# Patient Record
Sex: Male | Born: 1975 | Race: White | Hispanic: No | Marital: Married | Smoking: Never smoker
Health system: Southern US, Community
[De-identification: ages and names within clinical notes are randomized; demographics above are authoritative.]

## PROBLEM LIST (undated history)

## (undated) DIAGNOSIS — M7071 Other bursitis of hip, right hip: Secondary | ICD-10-CM

## (undated) DIAGNOSIS — R079 Chest pain, unspecified: Secondary | ICD-10-CM

## (undated) DIAGNOSIS — R911 Solitary pulmonary nodule: Secondary | ICD-10-CM

## (undated) DIAGNOSIS — E785 Hyperlipidemia, unspecified: Secondary | ICD-10-CM

## (undated) DIAGNOSIS — I200 Unstable angina: Secondary | ICD-10-CM

## (undated) DIAGNOSIS — M1990 Unspecified osteoarthritis, unspecified site: Secondary | ICD-10-CM

## (undated) DIAGNOSIS — R569 Unspecified convulsions: Secondary | ICD-10-CM

## (undated) DIAGNOSIS — R002 Palpitations: Secondary | ICD-10-CM

## (undated) DIAGNOSIS — E039 Hypothyroidism, unspecified: Secondary | ICD-10-CM

## (undated) DIAGNOSIS — I2510 Atherosclerotic heart disease of native coronary artery without angina pectoris: Secondary | ICD-10-CM

## (undated) DIAGNOSIS — E109 Type 1 diabetes mellitus without complications: Secondary | ICD-10-CM

## (undated) HISTORY — PX: KNEE ARTHROSCOPY: SHX127

## (undated) HISTORY — DX: Unstable angina: I20.0

---

## 2015-08-15 HISTORY — PX: OTHER SURGICAL HISTORY: SHX169

## 2017-09-24 DIAGNOSIS — R079 Chest pain, unspecified: Secondary | ICD-10-CM | POA: Diagnosis not present

## 2017-09-24 DIAGNOSIS — E109 Type 1 diabetes mellitus without complications: Secondary | ICD-10-CM | POA: Diagnosis not present

## 2017-09-25 ENCOUNTER — Inpatient Hospital Stay (HOSPITAL_COMMUNITY)
Admission: AD | Admit: 2017-09-25 | Discharge: 2017-09-27 | DRG: 287 | Disposition: A | Discharge status: Home / Self Care | Payer: BLUE CROSS/BLUE SHIELD | Source: Other Acute Inpatient Hospital | Attending: Internal Medicine | Admitting: Internal Medicine

## 2017-09-25 ENCOUNTER — Other Ambulatory Visit: Payer: Self-pay

## 2017-09-25 ENCOUNTER — Encounter (HOSPITAL_COMMUNITY): Payer: Self-pay | Admitting: Nurse Practitioner

## 2017-09-25 DIAGNOSIS — E10649 Type 1 diabetes mellitus with hypoglycemia without coma: Secondary | ICD-10-CM | POA: Diagnosis not present

## 2017-09-25 DIAGNOSIS — Z79890 Hormone replacement therapy (postmenopausal): Secondary | ICD-10-CM

## 2017-09-25 DIAGNOSIS — E119 Type 2 diabetes mellitus without complications: Secondary | ICD-10-CM

## 2017-09-25 DIAGNOSIS — F17220 Nicotine dependence, chewing tobacco, uncomplicated: Secondary | ICD-10-CM | POA: Diagnosis present

## 2017-09-25 DIAGNOSIS — Z8249 Family history of ischemic heart disease and other diseases of the circulatory system: Secondary | ICD-10-CM | POA: Diagnosis not present

## 2017-09-25 DIAGNOSIS — R0789 Other chest pain: Secondary | ICD-10-CM | POA: Diagnosis present

## 2017-09-25 DIAGNOSIS — E1059 Type 1 diabetes mellitus with other circulatory complications: Secondary | ICD-10-CM

## 2017-09-25 DIAGNOSIS — Z833 Family history of diabetes mellitus: Secondary | ICD-10-CM | POA: Diagnosis not present

## 2017-09-25 DIAGNOSIS — E785 Hyperlipidemia, unspecified: Secondary | ICD-10-CM | POA: Diagnosis present

## 2017-09-25 DIAGNOSIS — E7800 Pure hypercholesterolemia, unspecified: Secondary | ICD-10-CM

## 2017-09-25 DIAGNOSIS — I2582 Chronic total occlusion of coronary artery: Secondary | ICD-10-CM | POA: Diagnosis present

## 2017-09-25 DIAGNOSIS — R079 Chest pain, unspecified: Secondary | ICD-10-CM

## 2017-09-25 DIAGNOSIS — R911 Solitary pulmonary nodule: Secondary | ICD-10-CM | POA: Diagnosis present

## 2017-09-25 DIAGNOSIS — Z794 Long term (current) use of insulin: Secondary | ICD-10-CM

## 2017-09-25 DIAGNOSIS — I200 Unstable angina: Secondary | ICD-10-CM

## 2017-09-25 DIAGNOSIS — E109 Type 1 diabetes mellitus without complications: Secondary | ICD-10-CM | POA: Diagnosis not present

## 2017-09-25 DIAGNOSIS — E039 Hypothyroidism, unspecified: Secondary | ICD-10-CM | POA: Diagnosis present

## 2017-09-25 DIAGNOSIS — E1169 Type 2 diabetes mellitus with other specified complication: Secondary | ICD-10-CM | POA: Diagnosis present

## 2017-09-25 DIAGNOSIS — I25110 Atherosclerotic heart disease of native coronary artery with unstable angina pectoris: Principal | ICD-10-CM | POA: Diagnosis present

## 2017-09-25 DIAGNOSIS — Z01810 Encounter for preprocedural cardiovascular examination: Secondary | ICD-10-CM | POA: Diagnosis not present

## 2017-09-25 DIAGNOSIS — I2510 Atherosclerotic heart disease of native coronary artery without angina pectoris: Secondary | ICD-10-CM

## 2017-09-25 HISTORY — DX: Chest pain, unspecified: R07.9

## 2017-09-25 HISTORY — DX: Other chest pain: R07.89

## 2017-09-25 HISTORY — DX: Palpitations: R00.2

## 2017-09-25 HISTORY — DX: Hypothyroidism, unspecified: E03.9

## 2017-09-25 HISTORY — DX: Solitary pulmonary nodule: R91.1

## 2017-09-25 HISTORY — DX: Hyperlipidemia, unspecified: E78.5

## 2017-09-25 HISTORY — DX: Type 1 diabetes mellitus without complications: E10.9

## 2017-09-25 HISTORY — DX: Unspecified convulsions: R56.9

## 2017-09-25 HISTORY — DX: Other bursitis of hip, right hip: M70.71

## 2017-09-25 HISTORY — DX: Type 2 diabetes mellitus without complications: E11.9

## 2017-09-25 HISTORY — DX: Unspecified osteoarthritis, unspecified site: M19.90

## 2017-09-25 LAB — GLUCOSE, CAPILLARY: Glucose-Capillary: 287 mg/dL — ABNORMAL HIGH (ref 65–99)

## 2017-09-25 MED ORDER — HEPARIN SODIUM (PORCINE) 5000 UNIT/ML IJ SOLN
5000.0000 [IU] | Freq: Three times a day (TID) | INTRAMUSCULAR | Status: DC
  Administered 2017-09-25: 5000 [IU] via SUBCUTANEOUS
  Filled 2017-09-25: qty 1

## 2017-09-25 MED ORDER — ASPIRIN EC 81 MG PO TBEC
81.0000 mg | DELAYED_RELEASE_TABLET | Freq: Every day | ORAL | Status: DC
  Administered 2017-09-25 – 2017-09-27 (×2): 81 mg via ORAL
  Filled 2017-09-25 (×3): qty 1

## 2017-09-25 MED ORDER — LEVOTHYROXINE SODIUM 75 MCG PO TABS
175.0000 ug | ORAL_TABLET | Freq: Every day | ORAL | Status: DC
  Administered 2017-09-26 – 2017-09-27 (×2): 175 ug via ORAL
  Filled 2017-09-25 (×2): qty 1

## 2017-09-25 MED ORDER — INSULIN GLARGINE 100 UNIT/ML ~~LOC~~ SOLN
20.0000 [IU] | Freq: Every day | SUBCUTANEOUS | Status: DC
  Administered 2017-09-26 – 2017-09-27 (×2): 20 [IU] via SUBCUTANEOUS
  Filled 2017-09-25 (×2): qty 0.2

## 2017-09-25 MED ORDER — ONDANSETRON HCL 4 MG PO TABS: 4.0000 mg | ORAL_TABLET | Freq: Four times a day (QID) | ORAL | Status: DC | PRN

## 2017-09-25 MED ORDER — SENNOSIDES-DOCUSATE SODIUM 8.6-50 MG PO TABS: 1.0000 | ORAL_TABLET | Freq: Every evening | ORAL | Status: DC | PRN

## 2017-09-25 MED ORDER — ATORVASTATIN CALCIUM 40 MG PO TABS
40.0000 mg | ORAL_TABLET | Freq: Every day | ORAL | Status: DC
  Administered 2017-09-25 – 2017-09-26 (×2): 40 mg via ORAL
  Filled 2017-09-25 (×2): qty 1

## 2017-09-25 MED ORDER — ACETAMINOPHEN 325 MG PO TABS
650.0000 mg | ORAL_TABLET | Freq: Four times a day (QID) | ORAL | Status: DC | PRN
  Administered 2017-09-27: 650 mg via ORAL
  Filled 2017-09-25: qty 2

## 2017-09-25 MED ORDER — ACETAMINOPHEN 650 MG RE SUPP: 650.0000 mg | Freq: Four times a day (QID) | RECTAL | Status: DC | PRN

## 2017-09-25 MED ORDER — INSULIN ASPART 100 UNIT/ML ~~LOC~~ SOLN: 0.0000 [IU] | Freq: Three times a day (TID) | SUBCUTANEOUS | Status: DC

## 2017-09-25 MED ORDER — INSULIN DEGLUDEC 100 UNIT/ML ~~LOC~~ SOPN: 40.0000 [IU] | PEN_INJECTOR | Freq: Every day | SUBCUTANEOUS | Status: DC

## 2017-09-25 MED ORDER — INSULIN GLARGINE 100 UNIT/ML ~~LOC~~ SOLN: 40.0000 [IU] | Freq: Every day | SUBCUTANEOUS | Status: DC

## 2017-09-25 MED ORDER — ONDANSETRON HCL 4 MG/2ML IJ SOLN
4.0000 mg | Freq: Four times a day (QID) | INTRAMUSCULAR | Status: DC | PRN
Start: 2017-09-25 — End: 2017-09-27

## 2017-09-25 MED ORDER — HYDROCODONE-ACETAMINOPHEN 5-325 MG PO TABS: 1.0000 | ORAL_TABLET | ORAL | Status: DC | PRN

## 2017-09-25 NOTE — H&P (Signed)
History and Physical    Chiron Louis Byrd:[id number] DOB: 10/13/1975 DOA: 09/25/2017  PCP: No primary care provider on file. Patient coming from: [facility]  Chief Complaint: Chest pain  HPI: Louis Byrd is a 42 y.o. male with medical history significant type 1 diabetes hyperlipidemia admitted to [facility] 5 days ago complaints of chest pain. He was admitted for chest pain rule out  Chart review indicates patient with multiple risk factors including diabetes hyperlipidemia and family history. He underwent a stress test that was abnormal. Case was discussed with cardiology at [facility] who recommended hospital hospital transfer for cardiac cath. In addition high dose aspirin and statin added increased dose as well as a beta blocker were recommended. He states the pain was left anterior describes it as a pressure. Pain has been "at a minimum" for the last 2 days. The time of admission he is pain-free. He's been evaluated by [facility] who opined most likely a blockage and had scheduled cardiac catheter tomorrow time uncertain at this point.   Patient denies headache dizziness syncope or near-syncope. He denies abdominal pain nausea vomiting diarrhea constipation melena bright red blood per rectum. He denies dysuria hematuria frequency or urgency.    ED Course: On arrival he is afebrile a hemodynamically stable and not hypoxic.  Review of Systems: As per HPI otherwise all other systems reviewed and are negative.   Ambulatory Status: Ambulates independently is independent with ADLs  Past Medical History:  Diagnosis Date  . Chest pain    a. 1/2/019 Ex MV [facility]): Ex time 8:00 No ecg changes. Large severe apical rev defect and small mod sev apicoseptal rev defect. EF 42%.  . Hyperlipidemia   . Palpitations    a. premature atrial contractions.  . Pulmonary nodule    a. 08/2017 CXR - [facility]: "pulm nodule posterior lung base on lateral film, rec noncontrast chest CT for  further eval.  . Type I diabetes mellitus (HCC)    a. Dx age 70;  b. Most recent A1c 6.3.      Social History   Socioeconomic History  . Marital status: Single    Spouse name: Not on file  . Number of children: Not on file  . Years of education: Not on file  . Highest education level: Not on file  Social Needs  . Financial resource strain: Not on file  . Food insecurity - worry: Not on file  . Food insecurity - inability: Not on file  . Transportation needs - medical: Not on file  . Transportation needs - non-medical: Not on file  Occupational History  . Occupation: Armed forces training and education officer    Comment: does some heavy exertion.  Tobacco Use  . Smoking status: Never Smoker  . Smokeless tobacco: Current User    Types: Chew, Snuff  Substance and Sexual Activity  . Alcohol use: Yes    Frequency: Never    Comment: Occasional drink on the weekend.  Sometimes heavy.  . Drug use: No  . Sexual activity: Not on file  Other Topics Concern  . Not on file  Social History Narrative   Lives in [place] with his wife and their children.    Allergies not on file  Family History  Problem Relation Age of Onset  . Heart attack Father   . Diabetes Mellitus I Brother     Prior to Admission medications   Medication Sig Start Date End Date Taking? Authorizing Provider  levothyroxine (SYNTHROID, LEVOTHROID) 175 MCG tablet Take  175 mcg by mouth daily. 09/07/17   [provider]  lisinopril (PRINIVIL,ZESTRIL) 10 MG tablet Take 10 mg by mouth daily. 08/24/17   [provider]  naproxen (NAPROSYN) 500 MG tablet Take 500 mg by mouth 2 (two) times daily as needed for pain. 09/24/17   [provider]  TRESIBA FLEXTOUCH 100 UNIT/ML SOPN FlexTouch Pen Inject 40 Units into the skin daily. 08/30/17   [provider]    Physical Exam: Vitals:   09/25/17 1721  BP: 136/89  Pulse: 67  Resp: 18  Temp: 98 F (36.7 C)  TempSrc: Oral  SpO2: 98%  Weight: 84.8 kg (187  lb)  Height: 5\' 9"  (1.753 m)     General:  Appears calm and comfortable sitting on the side of the bed in no acute distress Eyes:  PERRL, EOMI, normal lids, iris no scleral icterus ENT:  grossly normal hearing, lips & tongue,  Neck:  no LAD, masses or thyromegaly Cardiovascular:  RRR, no m/r/g. No LE edema.  Respiratory:  CTA bilaterally, no w/r/r. Normal respiratory effort. Abdomen:  soft, ntnd, hospital bowel sounds throughout no guarding or rebounding Skin:  no rash or induration seen on limited exam Musculoskeletal:  grossly normal tone BUE/BLE, good ROM, no bony abnormality Psychiatric:  grossly normal mood and affect, speech fluent and appropriate, AOx3 Neurologic:  CN 2-12 grossly intact, moves all extremities in coordinated fashion, sensation intact speech clear facial symmetry  Labs on Admission: I have personally reviewed following labs and imaging studies  CBC: No results for input(s): WBC, NEUTROABS, HGB, HCT, MCV, PLT in the last 168 hours. Basic Metabolic Panel: No results for input(s): NA, K, CL, CO2, GLUCOSE, BUN, CREATININE, CALCIUM, MG, PHOS in the last 168 hours. GFR: CrCl cannot be calculated (No order found.). Liver Function Tests: No results for input(s): AST, ALT, ALKPHOS, BILITOT, PROT, ALBUMIN in the last 168 hours. No results for input(s): LIPASE, AMYLASE in the last 168 hours. No results for input(s): AMMONIA in the last 168 hours. Coagulation Profile: No results for input(s): INR, PROTIME in the last 168 hours. Cardiac Enzymes: No results for input(s): CKTOTAL, CKMB, CKMBINDEX, TROPONINI in the last 168 hours. BNP (last 3 results) No results for input(s): PROBNP in the last 8760 hours. HbA1C: No results for input(s): HGBA1C in the last 72 hours. CBG: No results for input(s): GLUCAP in the last 168 hours. Lipid Profile: No results for input(s): CHOL, HDL, LDLCALC, TRIG, CHOLHDL, LDLDIRECT in the last 72 hours. Thyroid Function Tests: No results for  input(s): TSH, T4TOTAL, FREET4, T3FREE, THYROIDAB in the last 72 hours. Anemia Panel: No results for input(s): VITAMINB12, FOLATE, FERRITIN, TIBC, IRON, RETICCTPCT in the last 72 hours. Urine analysis: No results found for: COLORURINE, APPEARANCEUR, LABSPEC, PHURINE, GLUCOSEU, HGBUR, BILIRUBINUR, KETONESUR, PROTEINUR, UROBILINOGEN, NITRITE, LEUKOCYTESUR  Creatinine Clearance: CrCl cannot be calculated (No order found.).  Sepsis Labs: @LABRCNTIP (procalcitonin:4,lacticidven:4) )No results found for this or any previous visit (from the past 240 hour(s)).   Radiological Exams on Admission: No results found.  EKG:   Assessment/Plan Principal Problem:   Chest pain Active Problems:   Solitary pulmonary nodule   Diabetes (HCC)   Hyperlipidemia   Unstable angina (HCC)   Hypothyroidism   #1. Chest pain. [facility] transferred to [place] [facility] for cardiac cath tomorrow. Evaluated by cardiology. -Admit to telemetry -Nothing by mouth past midnight -Aspirin and statin per recommendations -4 further recommendations to cardiology   #2. Diabetes. Medications include long-acting insulin and NovoLog -We'll continue long-acting  at a lower dose -Sliding scale per optimal control -Hold lisinopril for now -Carb modified diet  #3. Hypothyroidism. Home medications include Synthroid -Continue home meds  #4. Pulmonary nodule. Incidentally noted on chest x-ray -We'll need outpatient follow-up     DVT prophylaxis: heparin  Code Status: full  Family Communication: wife at bedside  Disposition Plan: home when ready  Consults called: dr c with cards  Admission status: inpatient    Gwenyth Bender MD [facility]  If 7PM-7AM, please contact night-coverage [email or web address] Password [facility]  09/25/2017, 6:25 PM

## 2017-09-25 NOTE — H&P (View-Only) (Signed)
Cardiology Consult    Patient ID: Louis Byrd MRN: [id number], DOB/AGE: March 18, 1976   Admit date: 09/25/2017 Date of Consult: 09/25/2017  Primary Physician: No primary care provider on file. Primary Cardiologist: Will f/u in [place] Requesting Provider: Micheal Likens, MD  Patient Profile    Louis Byrd is a 42 y.o. male with a history of type I DM, palpitations, and HL, who is being seen today for the evaluation of unstable angina and abnl stress test at the request of Dr. Randel Pigg.  Past Medical History   Past Medical History:  Diagnosis Date  . Chest pain    a. 1/2/019 Ex MV [facility]): Ex time 8:00 No ecg changes. Large severe apical rev defect and small mod sev apicoseptal rev defect. EF 42%.  . Hyperlipidemia   . Palpitations    a. premature atrial contractions.  . Pulmonary nodule    a. 08/2017 CXR - [facility]: "pulm nodule posterior lung base on lateral film, rec noncontrast chest CT for further eval.  . Type I diabetes mellitus (HCC)    a. Dx age 63;  b. Most recent A1c 6.3.    Allergies  Allergies not on file  History of Present Illness    42 year old male without prior cardiac history.  He does have a history of type 1 diabetes, diagnosed at age 65.  He has been on ACE inhibitor and statin therapy over the years.  He also has a history of occasional brief palpitations.  He lives in [place] with his wife and their children.  He plays heavy metal guitar for fun.  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  He was in his usual state of health until about 5-6 days ago, when he began to experience flushing with exertion, noting that his face would turn red.  Within 2 days or so, he began to experience exertional chest tightness and dyspnea occurring with minimal activity.  Symptoms persisted over the subsequent 3 days, prompting him to present to [facility] on February 11.  There, ECG was nonacute and  troponins were normal.  He continued to note mild chest discomfort, even at rest.  He underwent stress testing this morning was able to walk on the treadmill for 8 minutes.  ECG was unremarkable however, imaging showed apical and apicalseptal reversible ischemia.  EF was measured at 42%.  As result, he was transferred to [facility] for further evaluation.  He currently reports a very mild retrosternal chest tightness.  Inpatient Medications    . aspirin EC  81 mg Oral Daily  . heparin  5,000 Units Subcutaneous Q8H  . [START ON 09/26/2017] insulin glargine  40 Units Subcutaneous Daily  . [START ON 09/26/2017] levothyroxine  175 mcg Oral QAC breakfast    Family History    Family History  Problem Relation Age of Onset  . Heart attack Father   . Diabetes Mellitus I Brother    indicated that his mother is alive. He indicated that his father is deceased. He indicated that his brother is alive.   Social History    Social History   Socioeconomic History  . Marital status: Single    Spouse name: Not on file  . Number of children: Not on file  . Years of education: Not on file  . Highest education level: Not on file  Social Needs  . Financial resource strain: Not on file  . Food insecurity -  worry: Not on file  . Food insecurity - inability: Not on file  . Transportation needs - medical: Not on file  . Transportation needs - non-medical: Not on file  Occupational History  . 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  Tobacco Use  . Smoking status: Never Smoker  . Smokeless tobacco: Current User    Types: Chew, Snuff  Substance and Sexual Activity  . Alcohol use: Yes    Frequency: Never    Comment: Occasional drink on the weekend.  Sometimes heavy.  . Drug use: No  . Sexual activity: Not on file  Other Topics Concern  . Not on file  Social History Narrative   Lives in [place] with his wife and their children.     Review of Systems    General:  No  chills, fever, night sweats or weight changes.  Cardiovascular:  +++ exertional chest pain, +++ dyspnea on exertion, no edema, orthopnea, palpitations, paroxysmal nocturnal dyspnea. Dermatological: No rash, lesions/masses Respiratory: No cough, +++ dyspnea Urologic: No hematuria, dysuria Abdominal:   No nausea, vomiting, diarrhea, bright red blood per rectum, melena, or hematemesis Neurologic:  No visual changes, wkns, changes in mental status. All other systems reviewed and are otherwise negative except as noted above.  Physical Exam    Blood pressure 136/89, pulse 67, temperature 98 F (36.7 C), temperature source Oral, resp. rate 18, height 5\' 9"  (1.753 m), weight 187 lb (84.8 kg), SpO2 98 %.  General: Pleasant, NAD Psych: Normal affect. Neuro: Alert and oriented X 3. Moves all extremities spontaneously. HEENT: Normal  Neck: Supple without bruits or JVD. Lungs:  Resp regular and unlabored, CTA. Heart: RRR no s3, s4, or murmurs. Abdomen: Soft, non-tender, non-distended, BS + x 4.  Extremities: No clubbing, cyanosis or edema. DP/PT/Radials 2+ and equal bilaterally.  Labs    Hemoglobin 16.3, hematocrit 49.3, WBC 7.1, platelets 252 INR 1.1 Sodium 137, potassium 4.3, chloride 100, CO2 30, BUN 11, creatinine 0.70, glucose 119 Total bilirubin 0.9, alkaline phosphatase 62, AST 30, ALT 26, total protein 7.2, albumin 4.6 Troponin I 0.02, 0.02, less than 0.01 BNP 56 Total cholesterol 194, try glycerides 114, HDL 81, LDL 90.2 Urinalysis negative Urine drug screen negative  Radiology Studies    No results found.  ECG & Cardiac Imaging    Regular sinus rhythm, 82, no acute ST or T changes. (This was a 12-lead from his stress test)  Assessment & Plan    1.  Unstable angina: Patient presents on transfer from [facility] with a 5-6-day history of progressive exertional substernal chest heaviness associated with dyspnea in the setting of a 31-year history of type 1 diabetes.  He  ruled out for MI at [facility] however, stress testing was abnormal with suggestion of reversible ischemia in the apical and apical septal regions.  He currently reports very mild chest tightness.  We will plan on diagnostic catheterization in the morning.  The patient understands that risks include but are not limited to stroke (1 in 1000), death (1 in 1000), kidney failure [usually temporary] (1 in 500), bleeding (1 in 200), allergic reaction [possibly serious] (1 in 200), and agrees to proceed.  Continue aspirin, statin.  We will hold his lisinopril pending catheterization tomorrow.  2.  Hyperlipidemia: LDL was 90 with normal LFTs by evaluation at [facility].  Continue statin therapy.  3.  Type 1 diabetes mellitus: Internal medicine will be admitting.  Last A1c was 6.3.  4.  Pulmonary nodule: This was incidentally noted on chest x-ray at [facility] with recommendation for future follow-up with noncontrast CT.  5.  Tobacco abuse: Patient chews.  Cessation advised.  Signed, Nicolasa Ducking, NP 09/25/2017, 6:14 PM  For questions or updates, please contact   Please consult [email or web address] for contact info under Cardiology/STEMI.  I have seen and examined the patient along with  Ward Givens, NP.  I have reviewed the chart, notes and new data.  I agree with NP's note.  Key new complaints: classic symptoms of recent onset exertional angina with accelerated pattern Key examination changes: normal CV exam Key new findings / data: nuclear perfusion study c/w mid-LAD high grade stenosis, suspect low LVEF (42%) may just be due to ischemia/stunning, not permanent myocardial injury. No signs or symptoms of acute coronary insufficiency right now.  PLAN: Coronary angio and possible PCI-stent in AM. This procedure has been fully reviewed with the patient and written informed consent has been obtained.   Thurmon Fair, MD, [facility] [phone] 09/25/2017, 6:25 PM

## 2017-09-25 NOTE — Consult Note (Signed)
 Cardiology Consult    Patient ID: Louis Byrd MRN: [id number], DOB/AGE: 41/05/1976   Admit date: 09/25/2017 Date of Consult: 09/25/2017  Primary Physician: No primary care provider on file. Primary Cardiologist: Will f/u in [place] Requesting Provider: E. Silva Zapata, MD  Patient Profile    Louis Byrd is a 42 y.o. male with a history of type I DM, palpitations, and HL, who is being seen today for the evaluation of unstable angina and abnl stress test at the request of Dr. Silva Zapata.  Past Medical History   Past Medical History:  Diagnosis Date  . Chest pain    a. 1/2/019 Ex MV ([facility]): Ex time 8:00 No ecg changes. Large severe apical rev defect and small mod sev apicoseptal rev defect. EF 42%.  . Hyperlipidemia   . Palpitations    a. premature atrial contractions.  . Pulmonary nodule    a. 08/2017 CXR - [facility]: "pulm nodule posterior lung base on lateral film, rec noncontrast chest CT for further eval.  . Type I diabetes mellitus (HCC)    a. Dx age 42;  b. Most recent A1c 6.3.    Allergies  Allergies not on file  History of Present Illness    42-year-old male without prior cardiac history.  He does have a history of type 1 diabetes, diagnosed at age 42.  He has been on ACE inhibitor and statin therapy over the years.  He also has a history of occasional brief palpitations.  He lives in [place] with his wife and their children.  He plays heavy metal guitar for fun.  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  He was in his usual state of health until about 5-6 days ago, when he began to experience flushing with exertion, noting that his face would turn red.  Within 2 days or so, he began to experience exertional chest tightness and dyspnea occurring with minimal activity.  Symptoms persisted over the subsequent 3 days, prompting him to present to [facility] on February 11.  There, ECG was nonacute and  troponins were normal.  He continued to note mild chest discomfort, even at rest.  He underwent stress testing this morning was able to walk on the treadmill for 8 minutes.  ECG was unremarkable however, imaging showed apical and apicalseptal reversible ischemia.  EF was measured at 42%.  As result, he was transferred to [place] for further evaluation.  He currently reports a very mild retrosternal chest tightness.  Inpatient Medications    . aspirin EC  81 mg Oral Daily  . heparin  5,000 Units Subcutaneous Q8H  . [START ON 09/26/2017] insulin glargine  40 Units Subcutaneous Daily  . [START ON 09/26/2017] levothyroxine  175 mcg Oral QAC breakfast    Family History    Family History  Problem Relation Age of Onset  . Heart attack Father   . Diabetes Mellitus I Brother    indicated that his mother is alive. He indicated that his father is deceased. He indicated that his brother is alive.   Social History    Social History   Socioeconomic History  . Marital status: Single    Spouse name: Not on file  . Number of children: Not on file  . Years of education: Not on file  . Highest education level: Not on file  Social Needs  . Financial resource strain: Not on file  . Food insecurity -   worry: Not on file  . Food insecurity - inability: Not on file  . Transportation needs - medical: Not on file  . Transportation needs - non-medical: Not on file  Occupational History  . Occupation: Process technician    Comment: does some heavy exertion.  Tobacco Use  . Smoking status: Never Smoker  . Smokeless tobacco: Current User    Types: Chew, Snuff  Substance and Sexual Activity  . Alcohol use: Yes    Frequency: Never    Comment: Occasional drink on the weekend.  Sometimes heavy.  . Drug use: No  . Sexual activity: Not on file  Other Topics Concern  . Not on file  Social History Narrative   Lives in [place] with his wife and their children.     Review of Systems    General:  No  chills, fever, night sweats or weight changes.  Cardiovascular:  +++ exertional chest pain, +++ dyspnea on exertion, no edema, orthopnea, palpitations, paroxysmal nocturnal dyspnea. Dermatological: No rash, lesions/masses Respiratory: No cough, +++ dyspnea Urologic: No hematuria, dysuria Abdominal:   No nausea, vomiting, diarrhea, bright red blood per rectum, melena, or hematemesis Neurologic:  No visual changes, wkns, changes in mental status. All other systems reviewed and are otherwise negative except as noted above.  Physical Exam    Blood pressure 136/89, pulse 67, temperature 98 F (36.7 C), temperature source Oral, resp. rate 18, height 5' 9" (1.753 m), weight 187 lb (84.8 kg), SpO2 98 %.  General: Pleasant, NAD Psych: Normal affect. Neuro: Alert and oriented X 3. Moves all extremities spontaneously. HEENT: Normal  Neck: Supple without bruits or JVD. Lungs:  Resp regular and unlabored, CTA. Heart: RRR no s3, s4, or murmurs. Abdomen: Soft, non-tender, non-distended, BS + x 4.  Extremities: No clubbing, cyanosis or edema. DP/PT/Radials 2+ and equal bilaterally.  Labs    Hemoglobin 16.3, hematocrit 49.3, WBC 7.1, platelets 252 INR 1.1 Sodium 137, potassium 4.3, chloride 100, CO2 30, BUN 11, creatinine 0.70, glucose 119 Total bilirubin 0.9, alkaline phosphatase 62, AST 30, ALT 26, total protein 7.2, albumin 4.6 Troponin I 0.02, 0.02, less than 0.01 BNP 56 Total cholesterol 194, try glycerides 114, HDL 81, LDL 90.2 Urinalysis negative Urine drug screen negative  Radiology Studies    No results found.  ECG & Cardiac Imaging    Regular sinus rhythm, 82, no acute ST or T changes. (This was a 12-lead from his stress test)  Assessment & Plan    1.  Unstable angina: Patient presents on transfer from [facility] with a 5-6-day history of progressive exertional substernal chest heaviness associated with dyspnea in the setting of a 31-year history of type 1 diabetes.  He  ruled out for MI at [facility] however, stress testing was abnormal with suggestion of reversible ischemia in the apical and apical septal regions.  He currently reports very mild chest tightness.  We will plan on diagnostic catheterization in the morning.  The patient understands that risks include but are not limited to stroke (1 in 1000), death (1 in 1000), kidney failure [usually temporary] (1 in 500), bleeding (1 in 200), allergic reaction [possibly serious] (1 in 200), and agrees to proceed.  Continue aspirin, statin.  We will hold his lisinopril pending catheterization tomorrow.  2.  Hyperlipidemia: LDL was 90 with normal LFTs by evaluation at [facility].  Continue statin therapy.  3.  Type 1 diabetes mellitus: Internal medicine will be admitting.  Last A1c was 6.3.  4.    Pulmonary nodule: This was incidentally noted on chest x-ray at [facility] with recommendation for future follow-up with noncontrast CT.  5.  Tobacco abuse: Patient chews.  Cessation advised.  Signed, Christopher Berge, NP 09/25/2017, 6:14 PM  For questions or updates, please contact   Please consult [email or web address] for contact info under Cardiology/STEMI.  I have seen and examined the patient along with  Chris Berge, NP.  I have reviewed the chart, notes and new data.  I agree with NP's note.  Key new complaints: classic symptoms of recent onset exertional angina with accelerated pattern Key examination changes: normal CV exam Key new findings / data: nuclear perfusion study c/w mid-LAD high grade stenosis, suspect low LVEF (42%) may just be due to ischemia/stunning, not permanent myocardial injury. No signs or symptoms of acute coronary insufficiency right now.  PLAN: Coronary angio and possible PCI-stent in AM. This procedure has been fully reviewed with the patient and written informed consent has been obtained.   Karris Deangelo, MD, FACC [facility] [phone] 09/25/2017, 6:25 PM  

## 2017-09-26 ENCOUNTER — Inpatient Hospital Stay (HOSPITAL_COMMUNITY)
Admission: AD | Disposition: A | Discharge status: Home / Self Care | Payer: Self-pay | Source: Other Acute Inpatient Hospital | Attending: Internal Medicine

## 2017-09-26 ENCOUNTER — Encounter (HOSPITAL_COMMUNITY): Payer: Self-pay | Admitting: Cardiovascular Disease

## 2017-09-26 ENCOUNTER — Other Ambulatory Visit: Payer: Self-pay | Admitting: *Deleted

## 2017-09-26 ENCOUNTER — Inpatient Hospital Stay (HOSPITAL_COMMUNITY): Payer: BLUE CROSS/BLUE SHIELD

## 2017-09-26 DIAGNOSIS — I2510 Atherosclerotic heart disease of native coronary artery without angina pectoris: Secondary | ICD-10-CM

## 2017-09-26 DIAGNOSIS — I25110 Atherosclerotic heart disease of native coronary artery with unstable angina pectoris: Secondary | ICD-10-CM

## 2017-09-26 DIAGNOSIS — I200 Unstable angina: Secondary | ICD-10-CM

## 2017-09-26 HISTORY — PX: LEFT HEART CATH AND CORONARY ANGIOGRAPHY: CATH118249

## 2017-09-26 LAB — HIV ANTIBODY (ROUTINE TESTING W REFLEX): HIV Screen 4th Generation wRfx: NONREACTIVE

## 2017-09-26 LAB — GLUCOSE, CAPILLARY
GLUCOSE-CAPILLARY: 69 mg/dL (ref 65–99)
GLUCOSE-CAPILLARY: 100 mg/dL — AB (ref 65–99)
GLUCOSE-CAPILLARY: 186 mg/dL — AB (ref 65–99)
GLUCOSE-CAPILLARY: 212 mg/dL — AB (ref 65–99)
Glucose-Capillary: 80 mg/dL (ref 65–99)
Glucose-Capillary: 322 mg/dL — ABNORMAL HIGH (ref 65–99)

## 2017-09-26 LAB — BASIC METABOLIC PANEL
ANION GAP: 12 (ref 5–15)
BUN: 10 mg/dL (ref 6–20)
CO2: 28 mmol/L (ref 22–32)
Calcium: 9.3 mg/dL (ref 8.9–10.3)
Chloride: 101 mmol/L (ref 101–111)
Creatinine, Ser: 0.8 mg/dL (ref 0.61–1.24)
GFR calc Af Amer: >60 mL/min (ref >60)
Glucose, Bld: 116 mg/dL — ABNORMAL HIGH (ref 65–99)
Potassium: 3.7 mmol/L (ref 3.5–5.1)
SODIUM: 141 mmol/L (ref 135–145)

## 2017-09-26 LAB — ECHOCARDIOGRAM COMPLETE
HEIGHTINCHES: 69 in
Weight: 2936 oz

## 2017-09-26 LAB — CBC
HCT: 46.1 % (ref 39.0–52.0)
Hemoglobin: 16.1 g/dL (ref 13.0–17.0)
MCH: 30.4 pg (ref 26.0–34.0)
MCHC: 34.9 g/dL (ref 30.0–36.0)
MCV: 87 fL (ref 78.0–100.0)
PLATELETS: 220 10*3/uL (ref 150–400)
RBC: 5.3 MIL/uL (ref 4.22–5.81)
RDW: 12.9 % (ref 11.5–15.5)
WBC: 7.8 10*3/uL (ref 4.0–10.5)

## 2017-09-26 SURGERY — LEFT HEART CATH AND CORONARY ANGIOGRAPHY
Anesthesia: Choice

## 2017-09-26 MED ORDER — ASPIRIN 81 MG PO CHEW
81.0000 mg | CHEWABLE_TABLET | ORAL | Status: AC
  Administered 2017-09-26: 81 mg via ORAL
  Filled 2017-09-26: qty 1

## 2017-09-26 MED ORDER — IOPAMIDOL (ISOVUE-370) INJECTION 76%
INTRAVENOUS | Status: AC
  Filled 2017-09-26: qty 100

## 2017-09-26 MED ORDER — HEPARIN SODIUM (PORCINE) 1000 UNIT/ML IJ SOLN
INTRAMUSCULAR | Status: AC
  Filled 2017-09-26: qty 1

## 2017-09-26 MED ORDER — SODIUM CHLORIDE 0.9 % IV SOLN: 250.0000 mL | INTRAVENOUS | Status: DC | PRN

## 2017-09-26 MED ORDER — SODIUM CHLORIDE 0.9% FLUSH: 3.0000 mL | INTRAVENOUS | Status: DC | PRN

## 2017-09-26 MED ORDER — FENTANYL CITRATE (PF) 100 MCG/2ML IJ SOLN
INTRAMUSCULAR | Status: AC
  Filled 2017-09-26: qty 2

## 2017-09-26 MED ORDER — LIDOCAINE HCL 1 % IJ SOLN
INTRAMUSCULAR | Status: AC
  Filled 2017-09-26: qty 20

## 2017-09-26 MED ORDER — IOPAMIDOL (ISOVUE-370) INJECTION 76%
INTRAVENOUS | Status: DC | PRN
  Administered 2017-09-26: 47 mL via INTRAVENOUS

## 2017-09-26 MED ORDER — HEPARIN (PORCINE) IN NACL 2-0.9 UNIT/ML-% IJ SOLN
INTRAMUSCULAR | Status: AC
  Filled 2017-09-26: qty 1000

## 2017-09-26 MED ORDER — INSULIN ASPART 100 UNIT/ML ~~LOC~~ SOLN
4.0000 [IU] | Freq: Three times a day (TID) | SUBCUTANEOUS | Status: DC
  Administered 2017-09-26: 4 [IU] via SUBCUTANEOUS

## 2017-09-26 MED ORDER — LIDOCAINE HCL (PF) 1 % IJ SOLN
INTRAMUSCULAR | Status: DC | PRN
  Administered 2017-09-26: 2 mL via SUBCUTANEOUS

## 2017-09-26 MED ORDER — HEPARIN (PORCINE) IN NACL 100-0.45 UNIT/ML-% IJ SOLN
1100.0000 [IU]/h | INTRAMUSCULAR | Status: DC
  Administered 2017-09-26 – 2017-09-27 (×2): 1100 [IU]/h via INTRAVENOUS
  Filled 2017-09-26 (×2): qty 250

## 2017-09-26 MED ORDER — HEPARIN (PORCINE) IN NACL 2-0.9 UNIT/ML-% IJ SOLN
INTRAMUSCULAR | Status: AC | PRN
  Administered 2017-09-26 (×2): 500 mL

## 2017-09-26 MED ORDER — SODIUM CHLORIDE 0.9 % WEIGHT BASED INFUSION: 3.0000 mL/kg/h | INTRAVENOUS | Status: DC

## 2017-09-26 MED ORDER — VERAPAMIL HCL 2.5 MG/ML IV SOLN
INTRAVENOUS | Status: DC | PRN
  Administered 2017-09-26: 10 mL via INTRA_ARTERIAL

## 2017-09-26 MED ORDER — SODIUM CHLORIDE 0.9% FLUSH: 3.0000 mL | Freq: Two times a day (BID) | INTRAVENOUS | Status: DC

## 2017-09-26 MED ORDER — HEPARIN SODIUM (PORCINE) 1000 UNIT/ML IJ SOLN
INTRAMUSCULAR | Status: DC | PRN
  Administered 2017-09-26: 5000 [IU] via INTRAVENOUS

## 2017-09-26 MED ORDER — MIDAZOLAM HCL 2 MG/2ML IJ SOLN
INTRAMUSCULAR | Status: AC
  Filled 2017-09-26: qty 2

## 2017-09-26 MED ORDER — SODIUM CHLORIDE 0.9 % WEIGHT BASED INFUSION
3.0000 mL/kg/h | INTRAVENOUS | Status: AC
  Administered 2017-09-26: 3 mL/kg/h via INTRAVENOUS

## 2017-09-26 MED ORDER — INSULIN ASPART 100 UNIT/ML ~~LOC~~ SOLN
0.0000 [IU] | Freq: Three times a day (TID) | SUBCUTANEOUS | Status: DC
  Administered 2017-09-26: 4 [IU] via SUBCUTANEOUS

## 2017-09-26 MED ORDER — INSULIN ASPART 100 UNIT/ML ~~LOC~~ SOLN
5.0000 [IU] | Freq: Once | SUBCUTANEOUS | Status: AC
  Administered 2017-09-26: 5 [IU] via SUBCUTANEOUS

## 2017-09-26 MED ORDER — SODIUM CHLORIDE 0.9 % WEIGHT BASED INFUSION: 1.0000 mL/kg/h | INTRAVENOUS | Status: DC

## 2017-09-26 MED ORDER — SODIUM CHLORIDE 0.9 % WEIGHT BASED INFUSION
1.0000 mL/kg/h | INTRAVENOUS | Status: DC
  Administered 2017-09-26: 1 mL/kg/h via INTRAVENOUS

## 2017-09-26 MED ORDER — HEPARIN (PORCINE) IN NACL 100-0.45 UNIT/ML-% IJ SOLN: 1100.0000 [IU]/h | INTRAMUSCULAR | Status: DC

## 2017-09-26 MED ORDER — ASPIRIN 81 MG PO CHEW: 81.0000 mg | CHEWABLE_TABLET | ORAL | Status: DC

## 2017-09-26 MED ORDER — SODIUM CHLORIDE 0.9% FLUSH
3.0000 mL | Freq: Two times a day (BID) | INTRAVENOUS | Status: DC
  Administered 2017-09-26 – 2017-09-27 (×2): 3 mL via INTRAVENOUS

## 2017-09-26 MED ORDER — INSULIN ASPART 100 UNIT/ML ~~LOC~~ SOLN
0.0000 [IU] | Freq: Three times a day (TID) | SUBCUTANEOUS | Status: DC
  Administered 2017-09-26: 3 [IU] via SUBCUTANEOUS
  Administered 2017-09-27: 2 [IU] via SUBCUTANEOUS

## 2017-09-26 MED ORDER — MIDAZOLAM HCL 2 MG/2ML IJ SOLN
INTRAMUSCULAR | Status: DC | PRN
  Administered 2017-09-26: 2 mg via INTRAVENOUS

## 2017-09-26 MED ORDER — METOPROLOL TARTRATE 12.5 MG HALF TABLET
12.5000 mg | ORAL_TABLET | Freq: Two times a day (BID) | ORAL | Status: DC
  Administered 2017-09-26 – 2017-09-27 (×3): 12.5 mg via ORAL
  Filled 2017-09-26 (×3): qty 1

## 2017-09-26 MED ORDER — SODIUM CHLORIDE 0.9 % WEIGHT BASED INFUSION: 1.0000 mL/kg/h | INTRAVENOUS | Status: AC

## 2017-09-26 MED ORDER — FENTANYL CITRATE (PF) 100 MCG/2ML IJ SOLN
INTRAMUSCULAR | Status: DC | PRN
  Administered 2017-09-26: 25 ug via INTRAVENOUS

## 2017-09-26 MED ORDER — VERAPAMIL HCL 2.5 MG/ML IV SOLN
INTRAVENOUS | Status: AC
  Filled 2017-09-26: qty 2

## 2017-09-26 SURGICAL SUPPLY — 13 items
CATH IMPULSE 5F ANG/FL3.5 (CATHETERS) ×3 IMPLANT
CATH VISTA GUIDE 6FR JR3.5 SH (CATHETERS) IMPLANT
CATH VISTA GUIDE 7FR JL4.0 (CATHETERS) IMPLANT
DEVICE RAD COMP TR BAND LRG (VASCULAR PRODUCTS) ×3 IMPLANT
GUIDEWIRE INQWIRE 1.5J.035X260 (WIRE) ×1 IMPLANT
INQWIRE 1.5J .035X260CM (WIRE) ×3
KIT HEART LEFT (KITS) ×6 IMPLANT
KIT PREMIUM HAND CONTROLLER (KITS) ×3 IMPLANT
KIT SINGLE USE MANIFOLD (KITS) ×3 IMPLANT
KIT SYR MULTI USE (KITS) ×3 IMPLANT
PACK CARDIAC CATHETERIZATION (CUSTOM PROCEDURE TRAY) ×3 IMPLANT
TRANSDUCER W/STOPCOCK (MISCELLANEOUS) ×3 IMPLANT
TUBING CIL FLEX 10 FLL-RA (TUBING) ×3 IMPLANT

## 2017-09-26 NOTE — H&P (View-Only) (Signed)
Reason for Consult: 3 vessel CAD Referring Physician: Drs. Croitoru/ Louis Byrd is an 42 y.o. male.  HPI: Louis Byrd is a 42 yo man with a past history of type 1 DM x 31 years, palpitations, hyperlipidemia, hypothyroidism, arthritis and seizures. He has a strong family history of CAD but no known prior heart problems. 6 month history of fatigue. Over past week he started having flushing with exertion. Then he began to have chest tightness and shortness of breath. He was seen in the hospital at [facility] and ruled out for MI. He had a stress test which showed reversible apical and apical-septal ischemia and an EF of 42%. He was sent to [facility] for cath,  Catheterization was performed today. 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 EF appeared normal. He is currently pain free.  Possible left lower lobe lung nodule on CXR.  Past Medical History:  Diagnosis Date  . Arthritis   . Bursitis of right hip   . Chest pain    a. 1/2/019 Ex MV [place] Louis Byrd): Ex time 8:00 No ecg changes. Large severe apical rev defect and small mod sev apicoseptal rev defect. EF 42%.  . Hyperlipidemia   . Hypothyroidism   . Palpitations    a. premature atrial contractions.  . Pulmonary nodule    a. 08/2017 CXR - [facility]: "pulm nodule posterior lung base on lateral film, rec noncontrast chest CT for further eval.  . Seizures (Louis Byrd)    "only related to low blood sugars" (09/25/2017)  . Type I diabetes mellitus (Louis Byrd)    a. Dx age 74;  b. Most recent A1c 6.3.    Past Surgical History:  Procedure Laterality Date  . KNEE ARTHROSCOPY Right 1990s  . LEFT HEART CATH AND CORONARY ANGIOGRAPHY N/A 09/26/2017   Procedure: LEFT HEART CATH AND CORONARY ANGIOGRAPHY;  Surgeon: Sherren Mocha, MD;  Location: [place] [facility];  Service: Cardiovascular;  Laterality: N/A;    Family History  Problem Relation Age of Onset  . Heart attack Father   . Diabetes Mellitus I Brother     Social History:   reports that  has never smoked. His smokeless tobacco use includes chew and snuff. He reports that he drinks about 16.8 oz of alcohol per week. He reports that he does not use drugs.  Allergies: No Known Allergies  Medications:  Scheduled: . aspirin EC  81 mg Oral Daily  . atorvastatin  40 mg Oral q1800  . insulin aspart  0-9 Units Subcutaneous TID WC  . insulin aspart  4 Units Subcutaneous TID WC  . insulin glargine  20 Units Subcutaneous Daily  . levothyroxine  175 mcg Oral QAC breakfast  . metoprolol tartrate  12.5 mg Oral BID  . sodium chloride flush  3 mL Intravenous Q12H    Results for orders placed or performed during the hospital encounter of 09/25/17 (from the past 48 hour(s))  HIV antibody (Routine Testing)     Status: None   Collection Time: 09/25/17  6:59 PM  Result Value Ref Range   HIV Screen 4th Generation wRfx Non Reactive Non Reactive    Comment: (NOTE) Performed At: [facility] [address] [place], [place] [id number] Rush Farmer MD GY:[id number] Performed at [facility], [place] [address] [place], [place] [address]   Glucose, capillary     Status: Abnormal   Collection Time: 09/25/17  9:05 PM  Result Value Ref Range   Glucose-Capillary 287 (H) 65 -  99 mg/dL   Comment 1 Notify RN    Comment 2 Document in Chart   Basic metabolic panel     Status: Abnormal   Collection Time: 09/26/17  4:57 AM  Result Value Ref Range   Sodium 141 135 - 145 mmol/L   Potassium 3.7 3.5 - 5.1 mmol/L   Chloride 101 101 - 111 mmol/L   CO2 28 22 - 32 mmol/L   Glucose, Bld 116 (H) 65 - 99 mg/dL   BUN 10 6 - 20 mg/dL   Creatinine, Ser 0.80 0.61 - 1.24 mg/dL   Calcium 9.3 8.9 - 10.3 mg/dL   GFR calc non Af Amer >60 >60 mL/min   GFR calc Af Amer >60 >60 mL/min    Comment: (NOTE) The eGFR has been calculated using the CKD EPI equation. This calculation has not been validated in all clinical situations. eGFR's persistently <60 mL/min signify possible Chronic  Kidney Disease.    Anion gap 12 5 - 15    Comment: Performed at [place] [address], [place], [place] [address]  Glucose, capillary     Status: None   Collection Time: 09/26/17  6:31 AM  Result Value Ref Range   Glucose-Capillary 69 65 - 99 mg/dL   Comment 1 Notify RN    Comment 2 Document in Chart   Glucose, capillary     Status: Abnormal   Collection Time: 09/26/17  8:05 AM  Result Value Ref Range   Glucose-Capillary 100 (H) 65 - 99 mg/dL   Comment 1 Notify RN    Comment 2 Document in Chart   Glucose, capillary     Status: None   Collection Time: 09/26/17  9:59 AM  Result Value Ref Range   Glucose-Capillary 80 65 - 99 mg/dL   Comment 1 Notify RN    Comment 2 Document in Chart   Glucose, capillary     Status: Abnormal   Collection Time: 09/26/17 12:44 PM  Result Value Ref Range   Glucose-Capillary 186 (H) 65 - 99 mg/dL   Comment 1 Notify RN    Comment 2 Document in Chart   Glucose, capillary     Status: Abnormal   Collection Time: 09/26/17  4:42 PM  Result Value Ref Range   Glucose-Capillary 212 (H) 65 - 99 mg/dL   Comment 1 Notify RN    Comment 2 Document in Chart     No results found.  Review of Systems  Constitutional: Positive for malaise/fatigue. Negative for chills and fever.  Eyes: Negative for blurred vision and double vision.  Respiratory: Positive for shortness of breath. Negative for cough and wheezing.   Cardiovascular: Positive for chest pain and palpitations. Negative for orthopnea, claudication and leg swelling.  Gastrointestinal: Negative for nausea and vomiting.  Genitourinary: Negative for dysuria and urgency.  Musculoskeletal: Positive for joint pain. Negative for myalgias.  Neurological: Negative for seizures, loss of consciousness and headaches.  All other systems reviewed and are negative.  Blood pressure 138/74, pulse 74, temperature 97.8 F (36.6 C), temperature source Oral, resp. rate 15, height '5\' 9"'$  (1.753 m), weight 183 lb  8 oz (83.2 kg), SpO2 100 %. Physical Exam  Vitals reviewed. Constitutional: He is oriented to person, place, and time. He appears well-developed and well-nourished. No distress.  HENT:  Head: Normocephalic and atraumatic.  Mouth/Throat: No oropharyngeal exudate.  Eyes: Conjunctivae and EOM are normal. No scleral icterus.  Neck: Neck supple. No thyromegaly present.  Cardiovascular: Normal rate, normal  heart sounds and intact distal pulses. Exam reveals no gallop and no friction rub.  No murmur heard. Slightly irregular rhythm  Respiratory: Effort normal and breath sounds normal. No respiratory distress. He has no wheezes. He has no rales.  GI: Soft. He exhibits no distension. There is no tenderness.  Musculoskeletal: Normal range of motion. He exhibits no edema or deformity.  Lymphadenopathy:    He has no cervical adenopathy.  Neurological: He is alert and oriented to person, place, and time. No cranial nerve deficit. He exhibits normal muscle tone.  Skin: Skin is warm and dry.   CARDIAC CATHETERIZATION Conclusion     Mid RCA lesion is 80% stenosed.  Post Atrio-1 lesion is 80% stenosed.  Post Atrio-2 lesion is 90% stenosed.  Ost Cx to Prox Cx lesion is 80% stenosed.  Prox Cx to Mid Cx lesion is 90% stenosed.  Ost 2nd Mrg lesion is 50% stenosed.  Ost 1st Diag to 1st Diag lesion is 95% stenosed.  Prox LAD lesion is 100% stenosed.  The left ventricular systolic function is normal.  LV end diastolic pressure is normal.  The left ventricular ejection fraction is 55-65% by visual estimate.   1. Severe multivessel CAD with total occlusion of the mid-LAD, severe stenosis of the RCA, and severe stenosis of the left circumflex 2. Normal LV function with LVEF estimated at 60%, normal LVEDP  The patient has Type I DM and severe 3 vessel CAD with normal LV function, surgical coronary anatomy. Will place TCTS consult for CABG. Start heparin after TR band off.     I personally  reviewed the cath images and concur with the findings noted above  Assessment/Plan: 42 yo man with multiple CRF including hyperlipidemia, type 1 DM, tobacco use and a strong family history presents with new onset exertional angina. HE has been found to have severe 3 vessel CAD by cath. CABG indicated for survival benefit and relief of symptoms.  I discussed the general nature of the procedure, the need for general anesthesia, the  incisions to be used, and the use of cardiopulmonary bypass with Mr Louis Byrd and his family. We discussed the expected hospital stay, overall recovery and short and long term outcomes. I informed them of the indications, risks, benefits and alternatives. They understand the risks include, but are not limited to death, stroke, MI, DVT/PE, bleeding, possible need for transfusion, infections, cardiac arrhythmias, as well as other organ system dysfunction including respiratory, renal, or GI complications.   He accepts the risks and agrees to proceed.  Earliest OR availability is Monday 2/11.  Possible lung nodule on CXR- nonsmoker, low risk, can be evaluated as outpatient  Counseled on importance of tobacco cessation  Melrose Nakayama 09/26/2017, 5:52 PM

## 2017-09-26 NOTE — Progress Notes (Signed)
PROGRESS NOTE    Taher Vannote  XBJ:[id number] DOB: [birth year] DOA: 09/25/2017 PCP: Dema Severin, NP     Brief Narrative:  Louis Byrd is a 42 yo male with past medical history of diabetes type 1, hyperlipidemia who was admitted to [facility] 5 days ago with complaints of chest pain, which he described as a pressure pain. He underwent stress test which was abnormal. He was then transferred to [facility] for cardiac cath.    Assessment & Plan:   Principal Problem:   Chest pain Active Problems:   Solitary pulmonary nodule   Diabetes (HCC)   Hyperlipidemia   Unstable angina (HCC)   Hypothyroidism   Chest pain -Stress test at [facility] was positive for apical and apicalseptal reversible ischemia with EF 42%  -Heart cath planned 2/13 -Cardiology following -Heparin gtt  -Aspirin, lipitor, lopressor   DM type 1 -Check Ha1c  -Continue Lantus, SSI resistant scale   Hypothyroidism -Continue synthroid  Incidental pulmonary nodule -Outpatient follow up   DVT prophylaxis: heparin Code Status: Full Family Communication: family at bedside Disposition Plan: pending cath    Consultants:   Cardiology  Procedures:   Heart cath 2/13  Antimicrobials:  Anti-infectives (From admission, onward)   None        Subjective: Patient seen and evaluated prior to cath. He is standing at bedside, without complaints of chest pain or shortness of breath. Overall feeling well and ready for procedure.    Objective: Vitals:   09/26/17 0909 09/26/17 0914 09/26/17 0919 09/26/17 0919  BP: 124/83 122/80  136/86  Pulse: 76 77 68 74  Resp: 14 16 (!) 22 15  Temp:      TempSrc:      SpO2: 98% 100% 100% 100%  Weight:      Height:        Intake/Output Summary (Last 24 hours) at 09/26/2017 1109 Last data filed at 09/26/2017 0734 Gross per 24 hour  Intake 309.52 ml  Output -  Net 309.52 ml   Filed Weights   09/25/17 1721 09/26/17 0525  Weight: 84.8 kg (187  lb) 83.2 kg (183 lb 8 oz)    Examination:  General exam: Appears calm and comfortable  Respiratory system: Clear to auscultation. Respiratory effort normal. Cardiovascular system: S1 & S2 heard, RRR. No JVD, murmurs, rubs, gallops or clicks. No pedal edema. Gastrointestinal system: Abdomen is nondistended, soft and nontender. No organomegaly or masses felt. Normal bowel sounds heard. Central nervous system: Alert and oriented. No focal neurological deficits. Extremities: Symmetric 5 x 5 power. Skin: No rashes, lesions or ulcers Psychiatry: Judgement and insight appear normal. Mood & affect appropriate.   Data Reviewed: I have personally reviewed following labs and imaging studies  CBC: No results for input(s): WBC, NEUTROABS, HGB, HCT, MCV, PLT in the last 168 hours. Basic Metabolic Panel: Recent Labs  Lab 09/26/17 0457  NA 141  K 3.7  CL 101  CO2 28  GLUCOSE 116*  BUN 10  CREATININE 0.80  CALCIUM 9.3   GFR: Estimated Creatinine Clearance: 121.5 mL/min (by C-G formula based on SCr of 0.8 mg/dL). Liver Function Tests: No results for input(s): AST, ALT, ALKPHOS, BILITOT, PROT, ALBUMIN in the last 168 hours. No results for input(s): LIPASE, AMYLASE in the last 168 hours. No results for input(s): AMMONIA in the last 168 hours. Coagulation Profile: No results for input(s): INR, PROTIME in the last 168 hours. Cardiac Enzymes: No results for input(s): CKTOTAL, CKMB, CKMBINDEX, TROPONINI in the  last 168 hours. BNP (last 3 results) No results for input(s): PROBNP in the last 8760 hours. HbA1C: No results for input(s): HGBA1C in the last 72 hours. CBG: Recent Labs  Lab 09/25/17 2105 09/26/17 0631 09/26/17 0805 09/26/17 0959  GLUCAP 287* 69 100* 80   Lipid Profile: No results for input(s): CHOL, HDL, LDLCALC, TRIG, CHOLHDL, LDLDIRECT in the last 72 hours. Thyroid Function Tests: No results for input(s): TSH, T4TOTAL, FREET4, T3FREE, THYROIDAB in the last 72 hours. Anemia  Panel: No results for input(s): VITAMINB12, FOLATE, FERRITIN, TIBC, IRON, RETICCTPCT in the last 72 hours. Sepsis Labs: No results for input(s): PROCALCITON, LATICACIDVEN in the last 168 hours.  No results found for this or any previous visit (from the past 240 hour(s)).     Radiology Studies: No results found.    Scheduled Meds: . aspirin EC  81 mg Oral Daily  . atorvastatin  40 mg Oral q1800  . insulin aspart  0-20 Units Subcutaneous TID WC  . insulin glargine  20 Units Subcutaneous Daily  . levothyroxine  175 mcg Oral QAC breakfast  . metoprolol tartrate  12.5 mg Oral BID  . sodium chloride flush  3 mL Intravenous Q12H   Continuous Infusions: . sodium chloride    . sodium chloride       LOS: 1 day    Time spent: 40 minutes   Noralee StainJennifer Loriel Diehl, DO [facility] [email or web address] Password [email or web address] 09/26/2017, 11:09 AM

## 2017-09-26 NOTE — Interval H&P Note (Signed)
Cath Lab Visit (complete for each Cath Lab visit)  Clinical Evaluation Leading to the Procedure:   ACS: Yes.    Non-ACS:    Anginal Classification: CCS IV  Anti-ischemic medical therapy: Minimal Therapy (1 class of medications)  Non-Invasive Test Results: Intermediate-risk stress test findings: cardiac mortality 1-3%/year  Prior CABG: No previous CABG      History and Physical Interval Note:  09/26/2017 8:34 AM  Louis Byrd  has presented today for surgery, with the diagnosis of [place]  The various methods of treatment have been discussed with the patient and family. After consideration of risks, benefits and other options for treatment, the patient has consented to  Procedure(s): LEFT HEART CATH AND CORONARY ANGIOGRAPHY (N/A) as a surgical intervention .  The patient's history has been reviewed, patient examined, no change in status, stable for surgery.  I have reviewed the patient's chart and labs.  Questions were answered to the patient's satisfaction.     Tonny BollmanMichael [place]

## 2017-09-26 NOTE — Progress Notes (Signed)
ANTICOAGULATION CONSULT NOTE - Initial Consult  Pharmacy Consult for heparin Indication: chest pain/ACS  No Known Allergies  Patient Measurements: Height: 5\' 9"  (175.3 cm) Weight: 183 lb 8 oz (83.2 kg)(scale a) IBW/kg (Calculated) : 70.7 Heparin Dosing Weight: 83.2 Kg  Vital Signs: Temp: 97.8 F (36.6 C) (02/13 0525) Temp Source: Oral (02/13 0525) BP: 136/86 (02/13 0919) Pulse Rate: 74 (02/13 0919)  Labs: Recent Labs    09/26/17 0457  CREATININE 0.80   Estimated Creatinine Clearance: 121.5 mL/min (by C-G formula based on SCr of 0.8 mg/dL).  Medical History: Past Medical History:  Diagnosis Date  . Arthritis   . Bursitis of right hip   . Chest pain    a. 1/2/019 Ex MV [facility]([place]): Ex time 8:00 No ecg changes. Large severe apical rev defect and small mod sev apicoseptal rev defect. EF 42%.  . Hyperlipidemia   . Hypothyroidism   . Palpitations    a. premature atrial contractions.  . Pulmonary nodule    [facility]. [facility] "pulm nodule posterior lung base on lateral film, rec noncontrast chest CT for further eval.  . Seizures (HCC)    "only related to low blood sugars" (09/25/2017)  . Type I diabetes mellitus (HCC)    a. Dx age 42;  b. Most recent A1c 6.3.   Assessment: Louis Byrd is a 42 yo male s/p cath on 2/13 found to have 3 vessel disease awaiting CTCS consult for CABG. Pharmacy consulted to start heparin 4 hours post TR Band removal. No baseline labs, will order CBC with first heparin level.   Goal of Therapy:  Heparin level 0.3-0.7 units/ml Monitor platelets by anticoagulation protocol: Yes   Plan:  Start heparin infusion at 1100 units/hr Check anti-Xa level in 6 hours and daily while on heparin Continue to monitor H&H and platelets  Louis Byrd Louis Byrd 09/26/2017,1:20 PM

## 2017-09-26 NOTE — Progress Notes (Signed)
Inpatient Diabetes Program Recommendations  AACE/ADA: New Consensus Statement on Inpatient Glycemic Control (2015)  Target Ranges:  Prepandial:   less than 140 mg/dL      Peak postprandial:   less than 180 mg/dL (1-2 hours)      Critically ill patients:  140 - 180 mg/dL   Lab Results  Component Value Date   GLUCAP 186 (H) 09/26/2017    Review of Glycemic ControlResults for [name], Louis (MRN [id number]) as of 09/26/2017 14:46  Ref. Range 09/26/2017 06:31 09/26/2017 08:05 09/26/2017 09:59 09/26/2017 12:44  Glucose-Capillary Latest Ref Range: 65 - 99 mg/dL 69 409100 (H) 80 811186 (H)    Diabetes history: Type 1 DM Outpatient Diabetes medications: Tresiba 40 units once a day, Novolog 4-13 units tid with meals Current orders for Inpatient glycemic control:  Lantus 20 units daily, Novolog resistant tid with meals  Inpatient Diabetes Program Recommendations:    Please consider changing Novolog correction to sensitive tid with meals.  Also please add Novolog meal coverage 4 units tid with meals.   Will follow.   Thanks,  Beryl MeagerJenny Eliyahu Bille, RN, BC-ADM Inpatient Diabetes Coordinator Pager [phone] (8a-5p)

## 2017-09-26 NOTE — Consult Note (Signed)
Reason for Consult: 3 vessel CAD Referring Physician: Drs. Croitoru/ Earmon Sherrow is an 42 y.o. male.  HPI: Mr. Louis Byrd is a 42 yo man with a past history of type 1 DM x 31 years, palpitations, hyperlipidemia, hypothyroidism, arthritis and seizures. He has a strong family history of CAD but no known prior heart problems. 6 month history of fatigue. Over past week he started having flushing with exertion. Then he began to have chest tightness and shortness of breath. He was seen in the hospital at [facility] and ruled out for MI. He had a stress test which showed reversible apical and apical-septal ischemia and an EF of 42%. He was sent to [facility] for cath,  Catheterization was performed today. 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 EF appeared normal. He is currently pain free.  Possible left lower lobe lung nodule on CXR.  Past Medical History:  Diagnosis Date  . Arthritis   . Bursitis of right hip   . Chest pain    a. 1/2/019 Ex MV [facility] [place]): Ex time 8:00 No ecg changes. Large severe apical rev defect and small mod sev apicoseptal rev defect. EF 42%.  . Hyperlipidemia   . Hypothyroidism   . Palpitations    a. premature atrial contractions.  . Pulmonary nodule    a. 08/2017 CXR - [facility]: "pulm nodule posterior lung base on lateral film, rec noncontrast chest CT for further eval.  . Seizures ([place])    "only related to low blood sugars" (09/25/2017)  . Type I diabetes mellitus ([place])    a. Dx age 74;  b. Most recent A1c 6.3.    Past Surgical History:  Procedure Laterality Date  . KNEE ARTHROSCOPY Right 1990s  . LEFT HEART CATH AND CORONARY ANGIOGRAPHY N/A 09/26/2017   Procedure: LEFT HEART CATH AND CORONARY ANGIOGRAPHY;  Surgeon: Sherren Mocha, MD;  Location: [facility] [facility];  Service: Cardiovascular;  Laterality: N/A;    Family History  Problem Relation Age of Onset  . Heart attack Father   . Diabetes Mellitus I Brother     Social History:   reports that  has never smoked. His smokeless tobacco use includes chew and snuff. He reports that he drinks about 16.8 oz of alcohol per week. He reports that he does not use drugs.  Allergies: No Known Allergies  Medications:  Scheduled: . aspirin EC  81 mg Oral Daily  . atorvastatin  40 mg Oral q1800  . insulin aspart  0-9 Units Subcutaneous TID WC  . insulin aspart  4 Units Subcutaneous TID WC  . insulin glargine  20 Units Subcutaneous Daily  . levothyroxine  175 mcg Oral QAC breakfast  . metoprolol tartrate  12.5 mg Oral BID  . sodium chloride flush  3 mL Intravenous Q12H    Results for orders placed or performed during the hospital encounter of 09/25/17 (from the past 48 hour(s))  HIV antibody (Routine Testing)     Status: None   Collection Time: 09/25/17  6:59 PM  Result Value Ref Range   HIV Screen 4th Generation wRfx Non Reactive Non Reactive    Comment: (NOTE) Performed At: [facility] [address] [place], [place] [id number] Rush Farmer MD GY:[id number] Performed at [facility], [place] [address] [place], [place] [address]   Glucose, capillary     Status: Abnormal   Collection Time: 09/25/17  9:05 PM  Result Value Ref Range   Glucose-Capillary 287 (H) 65 -  99 mg/dL   Comment 1 Notify RN    Comment 2 Document in Chart   Basic metabolic panel     Status: Abnormal   Collection Time: 09/26/17  4:57 AM  Result Value Ref Range   Sodium 141 135 - 145 mmol/L   Potassium 3.7 3.5 - 5.1 mmol/L   Chloride 101 101 - 111 mmol/L   CO2 28 22 - 32 mmol/L   Glucose, Bld 116 (H) 65 - 99 mg/dL   BUN 10 6 - 20 mg/dL   Creatinine, Ser 0.80 0.61 - 1.24 mg/dL   Calcium 9.3 8.9 - 10.3 mg/dL   GFR calc non Af Amer >60 >60 mL/min   GFR calc Af Amer >60 >60 mL/min    Comment: (NOTE) The eGFR has been calculated using the CKD EPI equation. This calculation has not been validated in all clinical situations. eGFR's persistently <60 mL/min signify possible Chronic  Kidney Disease.    Anion gap 12 5 - 15    Comment: Performed at [facility] [address], [place], [place] [address]  Glucose, capillary     Status: None   Collection Time: 09/26/17  6:31 AM  Result Value Ref Range   Glucose-Capillary 69 65 - 99 mg/dL   Comment 1 Notify RN    Comment 2 Document in Chart   Glucose, capillary     Status: Abnormal   Collection Time: 09/26/17  8:05 AM  Result Value Ref Range   Glucose-Capillary 100 (H) 65 - 99 mg/dL   Comment 1 Notify RN    Comment 2 Document in Chart   Glucose, capillary     Status: None   Collection Time: 09/26/17  9:59 AM  Result Value Ref Range   Glucose-Capillary 80 65 - 99 mg/dL   Comment 1 Notify RN    Comment 2 Document in Chart   Glucose, capillary     Status: Abnormal   Collection Time: 09/26/17 12:44 PM  Result Value Ref Range   Glucose-Capillary 186 (H) 65 - 99 mg/dL   Comment 1 Notify RN    Comment 2 Document in Chart   Glucose, capillary     Status: Abnormal   Collection Time: 09/26/17  4:42 PM  Result Value Ref Range   Glucose-Capillary 212 (H) 65 - 99 mg/dL   Comment 1 Notify RN    Comment 2 Document in Chart     No results found.  Review of Systems  Constitutional: Positive for malaise/fatigue. Negative for chills and fever.  Eyes: Negative for blurred vision and double vision.  Respiratory: Positive for shortness of breath. Negative for cough and wheezing.   Cardiovascular: Positive for chest pain and palpitations. Negative for orthopnea, claudication and leg swelling.  Gastrointestinal: Negative for nausea and vomiting.  Genitourinary: Negative for dysuria and urgency.  Musculoskeletal: Positive for joint pain. Negative for myalgias.  Neurological: Negative for seizures, loss of consciousness and headaches.  All other systems reviewed and are negative.  Blood pressure 138/74, pulse 74, temperature 97.8 F (36.6 C), temperature source Oral, resp. rate 15, height '5\' 9"'$  (1.753 m), weight 183 lb  8 oz (83.2 kg), SpO2 100 %. Physical Exam  Vitals reviewed. Constitutional: He is oriented to person, place, and time. He appears well-developed and well-nourished. No distress.  HENT:  Head: Normocephalic and atraumatic.  Mouth/Throat: No oropharyngeal exudate.  Eyes: Conjunctivae and EOM are normal. No scleral icterus.  Neck: Neck supple. No thyromegaly present.  Cardiovascular: Normal rate, normal  heart sounds and intact distal pulses. Exam reveals no gallop and no friction rub.  No murmur heard. Slightly irregular rhythm  Respiratory: Effort normal and breath sounds normal. No respiratory distress. He has no wheezes. He has no rales.  GI: Soft. He exhibits no distension. There is no tenderness.  Musculoskeletal: Normal range of motion. He exhibits no edema or deformity.  Lymphadenopathy:    He has no cervical adenopathy.  Neurological: He is alert and oriented to person, place, and time. No cranial nerve deficit. He exhibits normal muscle tone.  Skin: Skin is warm and dry.   CARDIAC CATHETERIZATION Conclusion     Mid RCA lesion is 80% stenosed.  Post Atrio-1 lesion is 80% stenosed.  Post Atrio-2 lesion is 90% stenosed.  Ost Cx to Prox Cx lesion is 80% stenosed.  Prox Cx to Mid Cx lesion is 90% stenosed.  Ost 2nd Mrg lesion is 50% stenosed.  Ost 1st Diag to 1st Diag lesion is 95% stenosed.  Prox LAD lesion is 100% stenosed.  The left ventricular systolic function is normal.  LV end diastolic pressure is normal.  The left ventricular ejection fraction is 55-65% by visual estimate.   1. Severe multivessel CAD with total occlusion of the mid-LAD, severe stenosis of the RCA, and severe stenosis of the left circumflex 2. Normal LV function with LVEF estimated at 60%, normal LVEDP  The patient has Type I DM and severe 3 vessel CAD with normal LV function, surgical coronary anatomy. Will place TCTS consult for CABG. Start heparin after TR band off.     I personally  reviewed the cath images and concur with the findings noted above  Assessment/Plan: 42 yo man with multiple CRF including hyperlipidemia, type 1 DM, tobacco use and a strong family history presents with new onset exertional angina. HE has been found to have severe 3 vessel CAD by cath. CABG indicated for survival benefit and relief of symptoms.  I discussed the general nature of the procedure, the need for general anesthesia, the  incisions to be used, and the use of cardiopulmonary bypass with Mr Jurewicz and his family. We discussed the expected hospital stay, overall recovery and short and long term outcomes. I informed them of the indications, risks, benefits and alternatives. They understand the risks include, but are not limited to death, stroke, MI, DVT/PE, bleeding, possible need for transfusion, infections, cardiac arrhythmias, as well as other organ system dysfunction including respiratory, renal, or GI complications.   He accepts the risks and agrees to proceed.  Earliest OR availability is Monday 2/11.  Possible lung nodule on CXR- nonsmoker, low risk, can be evaluated as outpatient  Counseled on importance of tobacco cessation  Melrose Nakayama 09/26/2017, 5:52 PM

## 2017-09-26 NOTE — Progress Notes (Signed)
Patient has not had any c/o discomfort or bleeding from right radial site since returning from cath lab.   Remained on bedrest until 1530 with b/p obtained timely.  No s/sx of distress. Patient has eaten lunch and snack without reports of nausea.  Family present at bedside.  Will continue to observe patient for bleeding tendencies.

## 2017-09-26 NOTE — Progress Notes (Signed)
Patient CBG 322 tonight. No HS coverage.[facility] for Insulin order.  Cerys Winget, RN

## 2017-09-26 NOTE — Progress Notes (Signed)
  Echocardiogram 2D Echocardiogram has been performed.  Celene SkeenVijay  Samarra Ridgely 09/26/2017, 1:41 PM

## 2017-09-27 ENCOUNTER — Inpatient Hospital Stay (HOSPITAL_COMMUNITY): Payer: BLUE CROSS/BLUE SHIELD

## 2017-09-27 ENCOUNTER — Other Ambulatory Visit: Payer: Self-pay | Admitting: *Deleted

## 2017-09-27 DIAGNOSIS — Z01810 Encounter for preprocedural cardiovascular examination: Secondary | ICD-10-CM

## 2017-09-27 DIAGNOSIS — I209 Angina pectoris, unspecified: Secondary | ICD-10-CM

## 2017-09-27 DIAGNOSIS — I2510 Atherosclerotic heart disease of native coronary artery without angina pectoris: Secondary | ICD-10-CM

## 2017-09-27 DIAGNOSIS — I25118 Atherosclerotic heart disease of native coronary artery with other forms of angina pectoris: Secondary | ICD-10-CM

## 2017-09-27 DIAGNOSIS — E108 Type 1 diabetes mellitus with unspecified complications: Secondary | ICD-10-CM

## 2017-09-27 LAB — GLUCOSE, CAPILLARY
GLUCOSE-CAPILLARY: 35 mg/dL — AB (ref 65–99)
Glucose-Capillary: 97 mg/dL (ref 65–99)
Glucose-Capillary: 198 mg/dL — ABNORMAL HIGH (ref 65–99)

## 2017-09-27 LAB — URINALYSIS, ROUTINE W REFLEX MICROSCOPIC
Bacteria, UA: NONE SEEN
Bilirubin Urine: NEGATIVE
Glucose, UA: >=500 mg/dL — AB
HGB URINE DIPSTICK: NEGATIVE
Ketones, ur: NEGATIVE mg/dL
Leukocytes, UA: NEGATIVE
Nitrite: NEGATIVE
Protein, ur: NEGATIVE mg/dL
SPECIFIC GRAVITY, URINE: 1.014 (ref 1.005–1.030)
Squamous Epithelial / LPF: NONE SEEN
pH: 7 (ref 5.0–8.0)

## 2017-09-27 LAB — BASIC METABOLIC PANEL
Anion gap: 13 (ref 5–15)
BUN: 8 mg/dL (ref 6–20)
CALCIUM: 9.2 mg/dL (ref 8.9–10.3)
CO2: 24 mmol/L (ref 22–32)
CREATININE: 0.73 mg/dL (ref 0.61–1.24)
Chloride: 105 mmol/L (ref 101–111)
GFR calc Af Amer: >60 mL/min (ref >60)
GFR calc non Af Amer: >60 mL/min (ref >60)
GLUCOSE: 33 mg/dL — AB (ref 65–99)
Potassium: 3.3 mmol/L — ABNORMAL LOW (ref 3.5–5.1)
Sodium: 142 mmol/L (ref 135–145)

## 2017-09-27 LAB — CBC
HCT: 46.8 % (ref 39.0–52.0)
Hemoglobin: 16.1 g/dL (ref 13.0–17.0)
MCH: 29.7 pg (ref 26.0–34.0)
MCHC: 34.4 g/dL (ref 30.0–36.0)
MCV: 86.3 fL (ref 78.0–100.0)
PLATELETS: 215 10*3/uL (ref 150–400)
RBC: 5.42 MIL/uL (ref 4.22–5.81)
RDW: 13 % (ref 11.5–15.5)
WBC: 7.5 10*3/uL (ref 4.0–10.5)

## 2017-09-27 LAB — PULMONARY FUNCTION TEST
DL/VA % pred: 112 %
DL/VA: 5.18 ml/min/mmHg/L
DLCO COR % PRED: 107 %
DLCO COR: 33.41 ml/min/mmHg
DLCO UNC % PRED: 112 %
DLCO unc: 34.74 ml/min/mmHg
FEF 25-75 POST: 4.52 L/s
FEF 25-75 PRE: 4.68 L/s
FEF2575-%Change-Post: -3 %
FEF2575-%PRED-POST: 118 %
FEF2575-%PRED-PRE: 122 %
FEV1-%Change-Post: 0 %
FEV1-%PRED-POST: 104 %
FEV1-%Pred-Pre: 104 %
FEV1-POST: 4.25 L
FEV1-Pre: 4.24 L
FEV1FVC-%Change-Post: 5 %
FEV1FVC-%PRED-PRE: 103 %
FEV6-%Change-Post: -4 %
FEV6-%PRED-POST: 97 %
FEV6-%Pred-Pre: 102 %
FEV6-POST: 4.87 L
FEV6-Pre: 5.08 L
FEV6FVC-%Change-Post: 0 %
FEV6FVC-%PRED-POST: 102 %
FEV6FVC-%Pred-Pre: 102 %
FVC-%CHANGE-POST: -4 %
FVC-%Pred-Post: 95 %
FVC-%Pred-Pre: 100 %
FVC-Post: 4.89 L
FVC-Pre: 5.13 L
POST FEV1/FVC RATIO: 87 %
PRE FEV1/FVC RATIO: 83 %
Post FEV6/FVC ratio: 100 %
Pre FEV6/FVC Ratio: 99 %
RV % pred: 96 %
RV: 1.73 L
TLC % PRED: 102 %
TLC: 6.87 L

## 2017-09-27 LAB — HEMOGLOBIN A1C
HEMOGLOBIN A1C: 6.6 % — AB (ref 4.8–5.6)
Mean Plasma Glucose: 142.72 mg/dL

## 2017-09-27 LAB — HEPARIN LEVEL (UNFRACTIONATED): Heparin Unfractionated: 0.58 IU/mL (ref 0.30–0.70)

## 2017-09-27 MED ORDER — ASPIRIN 81 MG PO TBEC: 81.0000 mg | DELAYED_RELEASE_TABLET | Freq: Every day | ORAL | 0 refills | Status: DC

## 2017-09-27 MED ORDER — NITROGLYCERIN 0.4 MG SL SUBL: 0.4000 mg | SUBLINGUAL_TABLET | SUBLINGUAL | 0 refills | Status: DC | PRN

## 2017-09-27 MED ORDER — METOPROLOL TARTRATE 25 MG PO TABS: 12.5000 mg | ORAL_TABLET | Freq: Two times a day (BID) | ORAL | 0 refills | Status: DC

## 2017-09-27 MED ORDER — ATORVASTATIN CALCIUM 40 MG PO TABS: 40.0000 mg | ORAL_TABLET | Freq: Every day | ORAL | 0 refills | Status: DC

## 2017-09-27 MED ORDER — POTASSIUM CHLORIDE CRYS ER 20 MEQ PO TBCR
40.0000 meq | EXTENDED_RELEASE_TABLET | Freq: Once | ORAL | Status: AC
  Administered 2017-09-27: 40 meq via ORAL
  Filled 2017-09-27: qty 2

## 2017-09-27 MED ORDER — ALBUTEROL SULFATE (2.5 MG/3ML) 0.083% IN NEBU
2.5000 mg | INHALATION_SOLUTION | Freq: Once | RESPIRATORY_TRACT | Status: AC
Start: 2017-09-27 — End: 2017-09-27
  Administered 2017-09-27: 2.5 mg via RESPIRATORY_TRACT

## 2017-09-27 MED FILL — Lidocaine HCl Local Inj 1%: INTRAMUSCULAR | Qty: 20 | Status: AC

## 2017-09-27 MED FILL — Heparin Sodium (Porcine) 2 Unit/ML in Sodium Chloride 0.9%: INTRAMUSCULAR | Qty: 1000 | Status: AC

## 2017-09-27 NOTE — Progress Notes (Signed)
Pt up in room, getting ready to d/c. Discussed sternal precautions, IS, mobility, and d/c planning. Voiced understanding, gave him materials and set up wife to watch preop video while pt went to xray.  [phone] Ethelda ChickKristan Koltyn Kelsay CES, ACSM 2:34 PM 09/27/2017

## 2017-09-27 NOTE — Progress Notes (Signed)
Progress Note  Patient Name: Louis Byrd Date of Encounter: 09/27/2017  Primary Cardiologist: No primary care provider on file.   Subjective   No angina overnight.  Feels well.  Had PFTs with good results.  Doppler studies pending Echo shows normal left ventricular systolic function without regional wall motion abnormalities.  The right ventricle is described as mildly dilated, but with normal function. Hypoglycemia this morning.  Inpatient Medications    Scheduled Meds: . aspirin EC  81 mg Oral Daily  . atorvastatin  40 mg Oral q1800  . insulin aspart  0-9 Units Subcutaneous TID WC  . insulin aspart  4 Units Subcutaneous TID WC  . insulin glargine  20 Units Subcutaneous Daily  . levothyroxine  175 mcg Oral QAC breakfast  . metoprolol tartrate  12.5 mg Oral BID  . sodium chloride flush  3 mL Intravenous Q12H   Continuous Infusions: . sodium chloride     PRN Meds: sodium chloride, acetaminophen **OR** acetaminophen, HYDROcodone-acetaminophen, ondansetron **OR** ondansetron (ZOFRAN) IV, senna-docusate, sodium chloride flush   Vital Signs    Vitals:   09/26/17 2135 09/27/17 0023 09/27/17 0428 09/27/17 0821  BP: 131/82 128/85 118/71 120/80  Pulse: 74 67 (!) 50 89  Resp:  18 18   Temp:  97.7 F (36.5 C) (!) 97.4 F (36.3 C) 98 F (36.7 C)  TempSrc:  Oral Oral Oral  SpO2:  98% 96% 96%  Weight:   180 lb (81.6 kg)   Height:        Intake/Output Summary (Last 24 hours) at 09/27/2017 1027 Last data filed at 09/27/2017 1014 Gross per 24 hour  Intake 487.3 ml  Output 1200 ml  Net -712.7 ml   Filed Weights   09/25/17 1721 09/26/17 0525 09/27/17 0428  Weight: 187 lb (84.8 kg) 183 lb 8 oz (83.2 kg) 180 lb (81.6 kg)    Telemetry    Sinus rhythm- Personally Reviewed  ECG    No new tracing- Personally Reviewed  Physical Exam  Looks comfortable, smiling GEN: No acute distress.   Neck: No JVD Cardiac: RRR, no murmurs, rubs, or gallops.  Respiratory: Clear to  auscultation bilaterally. GI: Soft, nontender, non-distended  MS: No edema; No deformity.  No problems at the radial access site Neuro:  Nonfocal  Psych: Normal affect   Labs    Chemistry Recent Labs  Lab 09/26/17 0457 09/27/17 0610  NA 141 142  K 3.7 3.3*  CL 101 105  CO2 28 24  GLUCOSE 116* 33*  BUN 10 8  CREATININE 0.80 0.73  CALCIUM 9.3 9.2  GFRNONAA >60 >60  GFRAA >60 >60  ANIONGAP 12 13     Hematology Recent Labs  Lab 09/26/17 2143 09/27/17 0610  WBC 7.8 7.5  RBC 5.30 5.42  HGB 16.1 16.1  HCT 46.1 46.8  MCV 87.0 86.3  MCH 30.4 29.7  MCHC 34.9 34.4  RDW 12.9 13.0  PLT 220 215    Cardiac EnzymesNo results for input(s): TROPONINI in the last 168 hours. No results for input(s): TROPIPOC in the last 168 hours.   BNPNo results for input(s): BNP, PROBNP in the last 168 hours.   DDimer No results for input(s): DDIMER in the last 168 hours.   Radiology    No results found.  Cardiac Studies   Echo 09/26/2017 Study Conclusions  - Left ventricle: The cavity size was normal. Wall thickness was   normal. Systolic function was normal. The estimated ejection   fraction was in the  range of 50% to 55%. Wall motion was normal;   there were no regional wall motion abnormalities. Left   ventricular diastolic function parameters were normal for the   patient&'s age. - Right ventricle: The cavity size was mildly dilated.  CATH 09/26/2017  Conclusion     Mid RCA lesion is 80% stenosed.  Post Atrio-1 lesion is 80% stenosed.  Post Atrio-2 lesion is 90% stenosed.  Ost Cx to Prox Cx lesion is 80% stenosed.  Prox Cx to Mid Cx lesion is 90% stenosed.  Ost 2nd Mrg lesion is 50% stenosed.  Ost 1st Diag to 1st Diag lesion is 95% stenosed.  Prox LAD lesion is 100% stenosed.  The left ventricular systolic function is normal.  LV end diastolic pressure is normal.  The left ventricular ejection fraction is 55-65% by visual estimate.   1. Severe  multivessel CAD with total occlusion of the mid-LAD, severe stenosis of the RCA, and severe stenosis of the left circumflex 2. Normal LV function with LVEF estimated at 60%, normal LVEDP      Patient Profile     42 y.o. male with newly diagnosed three-vessel coronary artery disease including chronic total occlusion of the mid LAD, but with preserved left ventricular systolic function, on a background of long-standing type 1 diabetes mellitus with good glycemic control, resenting with new onset exertional angina in a crescendo pattern.  Assessment & Plan    Discussed with Dr. Dorris Fetch.  Plan for the patient to be discharged today, after completion of preop workup, to return on Monday for multivessel bypass surgery. Told him he should return to the emergency room if he develops any chest pain at rest or minimal activity.  Exertional symptoms that promptly resolve with rest or with sublingual nitroglycerin are to be expected until he has bypass surgery.  Reviewed the plan for postop care, long-term management of coronary risk factors reduce likelihood of recurrent disease in the coronary or other vascular beds.  For questions or updates, please contact [facility] Please consult [email or web address] for contact info under Cardiology/STEMI.      Signed, Thurmon Fair, MD  09/27/2017, 10:27 AM

## 2017-09-27 NOTE — Progress Notes (Signed)
Pre-op Cardiac Surgery  Carotid Findings:    Upper Extremity Right Left  Brachial Pressures 122T 122T  Radial Waveforms T T  Ulnar Waveforms T T  Palmar Arch (Elvis's Test) WNL WNL   Findings:  WNL    Lower  Extremity Right Left  Dorsalis Pedis    Anterior Tibial 165T 164T  Posterior Tibial 172T 152T  Ankle/Brachial Indices 1.4 1.3    Findings:  ABIs WNL

## 2017-09-27 NOTE — Progress Notes (Signed)
Hypoglycemic Event  CBG: 35  Treatment: Given orange juice and breakfast  Symptoms: none  Follow-up CBG: Time: 8:15 CBG Result:97  Possible Reasons for Event: States usually runs low in mornings.   Comments/MD notified:Dr. Alvino Chapelhoi notified at [address]48    Deshane Cotroneo R UnadillaKovach

## 2017-09-27 NOTE — Progress Notes (Signed)
Patient slept during the night. Wife at bedside.  Patient reports soreness this morning in his right wrist. Site without complication, level 0, capillary refill less than 3 seconds, fingers and arm warm to touch and patient able to move fingers and wrist normally. Offered Tylenol but patient is refusing at this time.     Will continue to monitor.Will address with day shift.   Kimorah Ridolfi, RN

## 2017-09-27 NOTE — Progress Notes (Signed)
ANTICOAGULATION CONSULT NOTE   Pharmacy Consult for heparin Indication: chest pain/ACS  No Known Allergies  Patient Measurements: Height: 5\' 9"  (175.3 cm) Weight: 180 lb (81.6 kg)(scale a) IBW/kg (Calculated) : 70.7 Heparin Dosing Weight: 83.2 Kg  Vital Signs: Temp: 97.4 F (36.3 C) (02/14 0428) Temp Source: Oral (02/14 0428) BP: 118/71 (02/14 0428) Pulse Rate: 50 (02/14 0428)  Labs: Recent Labs    09/26/17 0457 09/26/17 2143 09/27/17 0610  HGB  --  16.1 16.1  HCT  --  46.1 46.8  PLT  --  220 215  HEPARINUNFRC  --   --  0.58  CREATININE 0.80  --  0.73   Estimated Creatinine Clearance: 121.5 mL/min (by C-G formula based on SCr of 0.73 mg/dL).  Medical History: Past Medical History:  Diagnosis Date  . Arthritis   . Bursitis of right hip   . Chest pain    a. 1/2/019 Ex MV [facility]([place]): Ex time 8:00 No ecg changes. Large severe apical rev defect and small mod sev apicoseptal rev defect. EF 42%.  . Hyperlipidemia   . Hypothyroidism   . Palpitations    a. premature atrial contractions.  . Pulmonary nodule    a. [facility]: "pulm nodule posterior lung base on lateral film, rec noncontrast chest CT for further eval.  . Seizures ([facility])    "only related to low blood sugars" (09/25/2017)  . Type I diabetes mellitus ([facility])    a. Dx age 42;  b. Most recent A1c 6.3.   Assessment: Louis Byrd is a 42 yo male s/p cath on 2/13 found to have 3 vessel disease awaiting CTCS consult for CABG. Earliest OR availability is Monday 2/11. CBC stable, no overt bleeding documented.  Heparin level therapeutic: 0.58  Goal of Therapy:  Heparin level 0.3-0.7 units/ml Monitor platelets by anticoagulation protocol: Yes   Plan:  Continue heparin infusion at 1100 units/hr Check anti-Xa level daily while on heparin Continue to monitor H&H and platelets  Alinda Moneyony L Anaih Brander 09/27/2017,8:06 AM

## 2017-09-27 NOTE — Progress Notes (Signed)
Reviewed discharge instructions with patient and patient's wife. Answered all of their questions. Patient is getting chest xray and then will discharge. Patient will return to hospital on Monday 2/18 for CABG.

## 2017-09-27 NOTE — Progress Notes (Signed)
Inpatient Diabetes Program Recommendations  AACE/ADA: New Consensus Statement on Inpatient Glycemic Control (2015)  Target Ranges:  Prepandial:   less than 140 mg/dL      Peak postprandial:   less than 180 mg/dL (1-2 hours)      Critically ill patients:  140 - 180 mg/dL   Results for Louis Byrd, Louis Byrd (MRN [id number]) as of 09/27/2017 12:44  Ref. Range 09/26/2017 06:31 09/26/2017 08:05 09/26/2017 09:59 09/26/2017 12:44 09/26/2017 16:42 09/26/2017 21:05  Glucose-Capillary Latest Ref Range: 65 - 99 mg/dL 69 865100 (H) 80 784186 (H)  4 units Novolog +  20 units Lantus 212 (H)  7 units Novolog 322 (H)  5 units Novolog   Results for Louis Byrd, Louis Byrd (MRN [id number]) as of 09/27/2017 12:44  Ref. Range 09/27/2017 07:40  Glucose-Capillary Latest Ref Range: 65 - 99 mg/dL 35 (LL)     Home DM Meds: Tresiba 40 units daily        Novolog 4-13 units TID   Current Meds: Lantus 20 units daily    Novolog Sensitive Correction Scale/ SSI (0-9 units) TID AC   Novolog 4 units TID with meals      MD- Patient with Hypoglycemia yesterday AM and again this AM.  Received 20 units Lantus yesterday.  Please consider reducing Lantus to 15 units daily    --Will follow patient during hospitalization--  Ambrose FinlandJeannine Johnston Rodolfo Gaster RN, MSN, CDE Diabetes Coordinator Inpatient Glycemic Control Team Team Pager: [phone] (8a-5p)

## 2017-09-27 NOTE — Discharge Summary (Addendum)
Physician Discharge Summary  Louis Byrd ZOX:[id number] DOB: 1975-11-23 DOA: 09/25/2017  PCP: Dema Severin, NP  Admit date: 09/25/2017 Discharge date: 09/27/2017  Admitted From: Home Disposition:  Home  Recommendations for Outpatient Follow-up:  1. Follow up with Cardiothoracic surgery for CABG. Dr. Sunday Corn office will contact you regarding surgery. 2. Follow up outpatient regarding incidental left lower lobe nodule found on CXR.   Discharge Condition: Stable CODE STATUS: Full  Diet recommendation: Heart healthy   Brief/Interim Summary: Louis Byrd is a 42 yo male with past medical history of well controlled diabetes type 1, hyperlipidemia, hypothyroidism who was admitted to [facility] 5 days ago with complaints of chest pain, which he described as a pressure pain worsening with exertion. He underwent stress test which showed reversible apical and apical-septal ischemia and EF 42%. He was then transferred to [facility] for cardiac cath. 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 He will follow up with Cardiothoracic surgery for CABG.   Discharge Diagnoses:  Principal Problem:   Chest pain Active Problems:   Solitary pulmonary nodule   Diabetes (HCC)   Hyperlipidemia   Unstable angina (HCC)   Hypothyroidism  Chest pain with severe 3 vessel CAD  -Stress test at [facility] was positive for apical and apicalseptal reversible ischemia with EF 42%  -Heart cath revealed severe 3 vessel CAD -Cardiology following. Cardiothoracic surgery consulted.  -Patient to follow up with CTS for CABG next week  -Aspirin, lipitor, lopressor   DM type 1, well controlled  -Ha1c 6.6 -Continue home tresiba and novolog   Hypothyroidism -Continue synthroid  Incidental pulmonary nodule -Outpatient follow up     Discharge Instructions  Discharge Instructions    Call MD for:   Complete by:  As directed    Chest pain at rest   Call MD for:  difficulty breathing, headache or visual disturbances   Complete by:  As directed    Call MD for:  extreme fatigue   Complete by:  As directed    Call MD for:  hives   Complete by:  As directed    Call MD for:  persistant dizziness or light-headedness   Complete by:  As directed    Call MD for:  persistant nausea and vomiting   Complete by:  As directed    Call MD for:  severe uncontrolled pain   Complete by:  As directed    Call MD for:  temperature >100.4   Complete by:  As directed    Diet - low sodium heart healthy   Complete by:  As directed    Discharge instructions   Complete by:  As directed    You were cared for by a hospitalist during your hospital stay. If you have any questions about your discharge medications or the care you received while you were in the hospital after you are discharged, you can call the unit and asked to speak with the hospitalist on call if the hospitalist that took care of you is not available. Once you are discharged, your primary care physician will handle any further medical issues. Please note that NO REFILLS for any discharge medications will be authorized once you are discharged, as it is imperative that you return to your primary care physician (or establish a relationship with a primary care physician if you do not have one) for your aftercare needs so that they can reassess your need for medications and monitor  your lab values.   Increase activity slowly   Complete by:  As directed      Allergies as of 09/27/2017   No Known Allergies     Medication List    STOP taking these medications   lisinopril 10 MG tablet Commonly known as:  PRINIVIL,ZESTRIL   naproxen 500 MG tablet Commonly known as:  NAPROSYN     TAKE these medications   aspirin 81 MG EC tablet Take 1 tablet (81 mg total) by mouth daily. Start taking on:  09/28/2017   atorvastatin 40 MG tablet Commonly known as:  LIPITOR Take 1 tablet (40  mg total) by mouth daily at 6 PM.   insulin aspart 100 UNIT/ML injection Commonly known as:  novoLOG Inject 4-13 Units into the skin 3 (three) times daily before meals. Sliding Scale depending on food that is consumed.   levothyroxine 175 MCG tablet Commonly known as:  SYNTHROID, LEVOTHROID Take 175 mcg by mouth daily.   metoprolol tartrate 25 MG tablet Commonly known as:  LOPRESSOR Take 0.5 tablets (12.5 mg total) by mouth 2 (two) times daily.   nitroGLYCERIN 0.4 MG SL tablet Commonly known as:  NITROSTAT Place 1 tablet (0.4 mg total) under the tongue every 5 (five) minutes as needed for chest pain.   TRESIBA FLEXTOUCH 100 UNIT/ML Sopn FlexTouch Pen Generic drug:  insulin degludec Inject 40 Units into the skin daily.      Follow-up Information    Loreli Slot, MD Follow up.   Specialty:  Cardiothoracic Surgery Why:  His office will be in contact with you regarding surgery  Contact information: [address] [place] [address] [phone]        Dema Severin, NP Follow up.   Why:  Follow up with primary care physician regarding incidental pulmonary nodule found on chest xray at [facility] information: [address] [place] [address] [phone]          No Known Allergies  Consultations:  Cardiology  Cardiothoracic surgery   Procedures/Studies: No results found.  Heart cath  Mid RCA lesion is 80% stenosed.  Post Atrio-1 lesion is 80% stenosed.  Post Atrio-2 lesion is 90% stenosed.  Ost Cx to Prox Cx lesion is 80% stenosed.  Prox Cx to Mid Cx lesion is 90% stenosed.  Ost 2nd Mrg lesion is 50% stenosed.  Ost 1st Diag to 1st Diag lesion is 95% stenosed.  Prox LAD lesion is 100% stenosed.  The left ventricular systolic function is normal.  LV end diastolic pressure is normal.  The left ventricular ejection fraction is 55-65% by visual estimate.   1. Severe multivessel CAD with total occlusion of the  mid-LAD, severe stenosis of the RCA, and severe stenosis of the left circumflex 2. Normal LV function with LVEF estimated at 60%, normal LVEDP   Echocardiogram Study Conclusions  - Left ventricle: The cavity size was normal. Wall thickness was   normal. Systolic function was normal. The estimated ejection   fraction was in the range of 50% to 55%. Wall motion was normal;   there were no regional wall motion abnormalities. Left   ventricular diastolic function parameters were normal for the   patient&'s age. - Right ventricle: The cavity size was mildly dilated.  Vascular  US  Final Interpretation: Right Carotid: The extracranial vessels were near-normal with only minimal wall thickening or plaque.  Left Carotid: The extracranial vessels were near-normal with only minimal wallthickening or plaque. Vertebrals:  Both vertebral arteries were patent with antegrade flow. Subclavians:  Right ABI: Resting right ankle-brachial index is within normal range. No evidence of significant right lower extremity arterial disease. Left ABI: Resting left ankle-brachial index is within normal range. No evidence of significant left lower extremity arterial disease.   Discharge Exam: Vitals:   09/27/17 0821 09/27/17 1114  BP: 120/80 122/82  Pulse: 89 85  Resp:    Temp: 98 F (36.7 C) 98 F (36.7 C)  SpO2: 96% 98%   Vitals:   09/27/17 0023 09/27/17 0428 09/27/17 0821 09/27/17 1114  BP: 128/85 118/71 120/80 122/82  Pulse: 67 (!) 50 89 85  Resp: 18 18    Temp: 97.7 F (36.5 C) (!) 97.4 F (36.3 C) 98 F (36.7 C) 98 F (36.7 C)  TempSrc: Oral Oral Oral Oral  SpO2: 98% 96% 96% 98%  Weight:  81.6 kg (180 lb)    Height:        General: Pt is alert, awake, not in acute distress Cardiovascular: S1/S2 +, no rubs, no gallops Respiratory: CTA bilaterally, no wheezing, no rhonchi Abdominal: Soft, NT, ND Extremities: no edema, no cyanosis    The results of significant diagnostics from this  hospitalization (including imaging, microbiology, ancillary and laboratory) are listed below for reference.     Microbiology: No results found for this or any previous visit (from the past 240 hour(s)).   Labs: BNP (last 3 results) No results for input(s): BNP in the last 8760 hours. Basic Metabolic Panel: Recent Labs  Lab 09/26/17 0457 09/27/17 0610  NA 141 142  K 3.7 3.3*  CL 101 105  CO2 28 24  GLUCOSE 116* 33*  BUN 10 8  CREATININE 0.80 0.73  CALCIUM 9.3 9.2   Liver Function Tests: No results for input(s): AST, ALT, ALKPHOS, BILITOT, PROT, ALBUMIN in the last 168 hours. No results for input(s): LIPASE, AMYLASE in the last 168 hours. No results for input(s): AMMONIA in the last 168 hours. CBC: Recent Labs  Lab 09/26/17 2143 09/27/17 0610  WBC 7.8 7.5  HGB 16.1 16.1  HCT 46.1 46.8  MCV 87.0 86.3  PLT 220 215   Cardiac Enzymes: No results for input(s): CKTOTAL, CKMB, CKMBINDEX, TROPONINI in the last 168 hours. BNP: Invalid input(s): POCBNP CBG: Recent Labs  Lab 09/26/17 1244 09/26/17 1642 09/26/17 2105 09/27/17 0740 09/27/17 0812  GLUCAP 186* 212* 322* 35* 97   D-Dimer No results for input(s): DDIMER in the last 72 hours. Hgb A1c Recent Labs    09/27/17 0610  HGBA1C 6.6*   Lipid Profile No results for input(s): CHOL, HDL, LDLCALC, TRIG, CHOLHDL, LDLDIRECT in the last 72 hours. Thyroid function studies No results for input(s): TSH, T4TOTAL, T3FREE, THYROIDAB in the last 72 hours.  Invalid input(s): FREET3 Anemia work up No results for input(s): VITAMINB12, FOLATE, FERRITIN, TIBC, IRON, RETICCTPCT in the last 72 hours. Urinalysis No results found for: COLORURINE, APPEARANCEUR, LABSPEC, PHURINE, GLUCOSEU, HGBUR, BILIRUBINUR, KETONESUR, PROTEINUR, UROBILINOGEN, NITRITE, LEUKOCYTESUR Sepsis Labs Invalid input(s): PROCALCITONIN,  WBC,  LACTICIDVEN Microbiology No results found for this or any previous visit (from the past 240 hour(s)).   Time  coordinating discharge: 30 minutes  SIGNED:  Noralee StainJennifer Eilan Mcinerny, DO [facility] Pager [phone]  If 7PM-7AM, please contact night-coverage [email or web address] Password [email or web address] 09/27/2017, 1:11 PM

## 2017-09-28 ENCOUNTER — Other Ambulatory Visit: Payer: Self-pay

## 2017-09-28 ENCOUNTER — Encounter (HOSPITAL_COMMUNITY): Payer: Self-pay | Admitting: *Deleted

## 2017-09-28 MED ORDER — INSULIN REGULAR HUMAN 100 UNIT/ML IJ SOLN
INTRAMUSCULAR | Status: AC
  Administered 2017-10-01: .7 [IU]/h via INTRAVENOUS
  Filled 2017-09-28: qty 1

## 2017-09-28 MED ORDER — SODIUM CHLORIDE 0.9 % IV SOLN
1.5000 g | INTRAVENOUS | Status: AC
  Administered 2017-10-01: 1.5 g via INTRAVENOUS
  Administered 2017-10-01: .75 g via INTRAVENOUS
  Filled 2017-09-28: qty 1.5

## 2017-09-28 MED ORDER — VANCOMYCIN HCL 10 G IV SOLR
1250.0000 mg | INTRAVENOUS | Status: AC
  Administered 2017-10-01: 1250 mg via INTRAVENOUS
  Filled 2017-09-28: qty 1250

## 2017-09-28 MED ORDER — POTASSIUM CHLORIDE 2 MEQ/ML IV SOLN
80.0000 meq | INTRAVENOUS | Status: DC
  Filled 2017-09-28: qty 40

## 2017-09-28 MED ORDER — SODIUM CHLORIDE 0.9 % IV SOLN
INTRAVENOUS | Status: DC
  Filled 2017-09-28: qty 30

## 2017-09-28 MED ORDER — DEXMEDETOMIDINE HCL IN NACL 400 MCG/100ML IV SOLN
0.1000 ug/kg/h | INTRAVENOUS | Status: AC
  Administered 2017-10-01: .3 ug/kg/h via INTRAVENOUS
  Filled 2017-09-28: qty 100

## 2017-09-28 MED ORDER — DOPAMINE-DEXTROSE 3.2-5 MG/ML-% IV SOLN
0.0000 ug/kg/min | INTRAVENOUS | Status: AC
  Administered 2017-10-01: 3 ug/kg/min via INTRAVENOUS
  Filled 2017-09-28: qty 250

## 2017-09-28 MED ORDER — TRANEXAMIC ACID (OHS) PUMP PRIME SOLUTION
2.0000 mg/kg | INTRAVENOUS | Status: DC
  Filled 2017-09-28: qty 1.63

## 2017-09-28 MED ORDER — DEXTROSE 5 % IV SOLN
0.0000 ug/min | INTRAVENOUS | Status: DC
  Filled 2017-09-28: qty 4

## 2017-09-28 MED ORDER — TRANEXAMIC ACID 1000 MG/10ML IV SOLN
1.5000 mg/kg/h | INTRAVENOUS | Status: AC
  Administered 2017-10-01: 1.5 mg/kg/h via INTRAVENOUS
  Filled 2017-09-28: qty 25

## 2017-09-28 MED ORDER — MAGNESIUM SULFATE 50 % IJ SOLN
40.0000 meq | INTRAMUSCULAR | Status: DC
  Filled 2017-09-28: qty 9.85

## 2017-09-28 MED ORDER — MILRINONE LACTATE IN DEXTROSE 20-5 MG/100ML-% IV SOLN
0.1250 ug/kg/min | INTRAVENOUS | Status: DC
  Filled 2017-09-28: qty 100

## 2017-09-28 MED ORDER — SODIUM CHLORIDE 0.9 % IV SOLN
750.0000 mg | INTRAVENOUS | Status: DC
  Filled 2017-09-28: qty 750

## 2017-09-28 MED ORDER — SODIUM CHLORIDE 0.9 % IV SOLN
30.0000 ug/min | INTRAVENOUS | Status: AC
  Administered 2017-10-01: 10 ug/min via INTRAVENOUS
  Filled 2017-09-28: qty 2

## 2017-09-28 MED ORDER — NITROGLYCERIN IN D5W 200-5 MCG/ML-% IV SOLN
2.0000 ug/min | INTRAVENOUS | Status: AC
  Administered 2017-10-01: 3 ug/min via INTRAVENOUS
  Filled 2017-09-28: qty 250

## 2017-09-28 MED ORDER — TRANEXAMIC ACID (OHS) BOLUS VIA INFUSION
15.0000 mg/kg | INTRAVENOUS | Status: AC
  Administered 2017-10-01: 1224 mg via INTRAVENOUS
  Filled 2017-09-28: qty 1224

## 2017-09-28 MED ORDER — METOPROLOL TARTRATE 12.5 MG HALF TABLET: 12.5000 mg | ORAL_TABLET | Freq: Once | ORAL | Status: DC

## 2017-09-28 MED ORDER — PLASMA-LYTE 148 IV SOLN
INTRAVENOUS | Status: AC
  Administered 2017-10-01: 500 mL
  Filled 2017-09-28: qty 2.5

## 2017-09-28 MED ORDER — CHLORHEXIDINE GLUCONATE 0.12 % MT SOLN
15.0000 mL | Freq: Once | OROMUCOSAL | Status: AC
  Administered 2017-10-01: 15 mL via OROMUCOSAL
  Filled 2017-09-28: qty 15

## 2017-09-28 NOTE — Progress Notes (Signed)
Spoke with pt for pre-op call. Pt recently diagnosed with severe CAD. Pt denies any chest pain since being discharged from hospital yesterday. Pt is a Type 1 diabetic. Last A1C was 6.6 on 09/27/17. Pt states he keeps his fasting blood sugar between 60-100. Instructed pt to take 80% of his regular dose of Tresiba Monday AM (will take 32 units). Instructed pt to check his blood sugar Monday AM when he gets up. If blood sugar is >220 take 1/2 of usual correction dose of Novolog insulin. If blood sugar is 70 or below, treat with 1/2 cup of clear juice (apple or cranberry) and recheck blood sugar 15 minutes after drinking juice. Pt voiced understanding.  Also went over with pt on how to take his NTG if he should need to use. I instructed him that he needed to be sitting when he takes it. Wait five minutes, if pain is still there, take another NTG (stay seated). Wait another five minutes and if pain is still there, take another NTG and have wife call 911. If pain stops after 1st or 2nd, stay seated for a while, then when slowly get up. Do not just jump up from a sitting position. Pt voiced understanding.  Pt had told me that he was instructed to stop his Atorvastatin when he left the hospital. I did not see that in any of his discharge notes. I called Ryan at Dr. Sunday CornHendrickson's office and she stated that they did not tell him to stop it and he needs to take it. I told her that I would call pt and have him start it back today.  She states she would double check with Dr. Dorris FetchHendrickson this afternoon and if he wants him to stop it she will call pt.  I called pt and instructed him to restart the Atorvastatin. He voiced understanding.

## 2017-10-01 ENCOUNTER — Inpatient Hospital Stay (HOSPITAL_COMMUNITY): Payer: BLUE CROSS/BLUE SHIELD | Admitting: Certified Registered Nurse Anesthetist

## 2017-10-01 ENCOUNTER — Encounter (HOSPITAL_COMMUNITY): Payer: Self-pay | Admitting: Certified Registered Nurse Anesthetist

## 2017-10-01 ENCOUNTER — Inpatient Hospital Stay (HOSPITAL_COMMUNITY): Payer: BLUE CROSS/BLUE SHIELD

## 2017-10-01 ENCOUNTER — Inpatient Hospital Stay (HOSPITAL_COMMUNITY)
Admission: RE | Disposition: A | Discharge status: Home / Self Care | Payer: Self-pay | Source: Ambulatory Visit | Attending: Thoracic Surgery (Cardiothoracic Vascular Surgery)

## 2017-10-01 ENCOUNTER — Other Ambulatory Visit: Payer: Self-pay

## 2017-10-01 ENCOUNTER — Inpatient Hospital Stay (HOSPITAL_COMMUNITY)
Admission: RE | Admit: 2017-10-01 | Discharge: 2017-10-05 | DRG: 236 | Disposition: A | Discharge status: Home / Self Care | Payer: BLUE CROSS/BLUE SHIELD | Source: Ambulatory Visit | Attending: Thoracic Surgery (Cardiothoracic Vascular Surgery) | Admitting: Thoracic Surgery (Cardiothoracic Vascular Surgery)

## 2017-10-01 DIAGNOSIS — Z716 Tobacco abuse counseling: Secondary | ICD-10-CM

## 2017-10-01 DIAGNOSIS — R911 Solitary pulmonary nodule: Secondary | ICD-10-CM | POA: Diagnosis present

## 2017-10-01 DIAGNOSIS — Z833 Family history of diabetes mellitus: Secondary | ICD-10-CM

## 2017-10-01 DIAGNOSIS — R110 Nausea: Secondary | ICD-10-CM | POA: Diagnosis not present

## 2017-10-01 DIAGNOSIS — E109 Type 1 diabetes mellitus without complications: Secondary | ICD-10-CM | POA: Diagnosis present

## 2017-10-01 DIAGNOSIS — I493 Ventricular premature depolarization: Secondary | ICD-10-CM | POA: Diagnosis not present

## 2017-10-01 DIAGNOSIS — E8770 Fluid overload, unspecified: Secondary | ICD-10-CM | POA: Diagnosis not present

## 2017-10-01 DIAGNOSIS — E039 Hypothyroidism, unspecified: Secondary | ICD-10-CM | POA: Diagnosis present

## 2017-10-01 DIAGNOSIS — Z8249 Family history of ischemic heart disease and other diseases of the circulatory system: Secondary | ICD-10-CM

## 2017-10-01 DIAGNOSIS — D62 Acute posthemorrhagic anemia: Secondary | ICD-10-CM | POA: Diagnosis not present

## 2017-10-01 DIAGNOSIS — I25118 Atherosclerotic heart disease of native coronary artery with other forms of angina pectoris: Secondary | ICD-10-CM | POA: Diagnosis present

## 2017-10-01 DIAGNOSIS — R000 Tachycardia, unspecified: Secondary | ICD-10-CM | POA: Diagnosis not present

## 2017-10-01 DIAGNOSIS — E785 Hyperlipidemia, unspecified: Secondary | ICD-10-CM | POA: Diagnosis present

## 2017-10-01 DIAGNOSIS — Z951 Presence of aortocoronary bypass graft: Secondary | ICD-10-CM

## 2017-10-01 DIAGNOSIS — I2510 Atherosclerotic heart disease of native coronary artery without angina pectoris: Secondary | ICD-10-CM | POA: Diagnosis not present

## 2017-10-01 DIAGNOSIS — J9811 Atelectasis: Secondary | ICD-10-CM | POA: Diagnosis not present

## 2017-10-01 DIAGNOSIS — Z794 Long term (current) use of insulin: Secondary | ICD-10-CM | POA: Diagnosis not present

## 2017-10-01 DIAGNOSIS — I25119 Atherosclerotic heart disease of native coronary artery with unspecified angina pectoris: Secondary | ICD-10-CM | POA: Diagnosis present

## 2017-10-01 DIAGNOSIS — I2582 Chronic total occlusion of coronary artery: Secondary | ICD-10-CM | POA: Diagnosis present

## 2017-10-01 DIAGNOSIS — Z720 Tobacco use: Secondary | ICD-10-CM | POA: Diagnosis not present

## 2017-10-01 HISTORY — PX: RADIAL ARTERY HARVEST: SHX5067

## 2017-10-01 HISTORY — PX: CORONARY ARTERY BYPASS GRAFT: SHX141

## 2017-10-01 HISTORY — PX: TEE WITHOUT CARDIOVERSION: SHX5443

## 2017-10-01 HISTORY — DX: Atherosclerotic heart disease of native coronary artery without angina pectoris: I25.10

## 2017-10-01 LAB — POCT I-STAT, CHEM 8
BUN: 8 mg/dL (ref 6–20)
BUN: 6 mg/dL (ref 6–20)
BUN: 7 mg/dL (ref 6–20)
BUN: 7 mg/dL (ref 6–20)
BUN: 6 mg/dL (ref 6–20)
BUN: 5 mg/dL — AB (ref 6–20)
BUN: 7 mg/dL (ref 6–20)
CALCIUM ION: 1.2 mmol/L (ref 1.15–1.40)
CALCIUM ION: 1.21 mmol/L (ref 1.15–1.40)
CHLORIDE: 104 mmol/L (ref 101–111)
CHLORIDE: 100 mmol/L — AB (ref 101–111)
CHLORIDE: 104 mmol/L (ref 101–111)
CHLORIDE: 104 mmol/L (ref 101–111)
CREATININE: 0.5 mg/dL — AB (ref 0.61–1.24)
CREATININE: 0.6 mg/dL — AB (ref 0.61–1.24)
CREATININE: 0.6 mg/dL — AB (ref 0.61–1.24)
CREATININE: 0.4 mg/dL — AB (ref 0.61–1.24)
CREATININE: 0.6 mg/dL — AB (ref 0.61–1.24)
Calcium, Ion: 0.98 mmol/L — ABNORMAL LOW (ref 1.15–1.40)
Calcium, Ion: 1.19 mmol/L (ref 1.15–1.40)
Calcium, Ion: 1.07 mmol/L — ABNORMAL LOW (ref 1.15–1.40)
Calcium, Ion: 1.08 mmol/L — ABNORMAL LOW (ref 1.15–1.40)
Calcium, Ion: 1.01 mmol/L — ABNORMAL LOW (ref 1.15–1.40)
Chloride: 105 mmol/L (ref 101–111)
Chloride: 104 mmol/L (ref 101–111)
Chloride: 108 mmol/L (ref 101–111)
Creatinine, Ser: 0.5 mg/dL — ABNORMAL LOW (ref 0.61–1.24)
Creatinine, Ser: 0.5 mg/dL — ABNORMAL LOW (ref 0.61–1.24)
GLUCOSE: 93 mg/dL (ref 65–99)
GLUCOSE: 104 mg/dL — AB (ref 65–99)
GLUCOSE: 83 mg/dL (ref 65–99)
GLUCOSE: 86 mg/dL (ref 65–99)
GLUCOSE: 156 mg/dL — AB (ref 65–99)
Glucose, Bld: 63 mg/dL — ABNORMAL LOW (ref 65–99)
Glucose, Bld: 83 mg/dL (ref 65–99)
HCT: 42 % (ref 39.0–52.0)
HCT: 31 % — ABNORMAL LOW (ref 39.0–52.0)
HCT: 33 % — ABNORMAL LOW (ref 39.0–52.0)
HCT: 32 % — ABNORMAL LOW (ref 39.0–52.0)
HEMATOCRIT: 41 % (ref 39.0–52.0)
HEMATOCRIT: 29 % — AB (ref 39.0–52.0)
HEMATOCRIT: 34 % — AB (ref 39.0–52.0)
HEMOGLOBIN: 10.5 g/dL — AB (ref 13.0–17.0)
HEMOGLOBIN: 11.2 g/dL — AB (ref 13.0–17.0)
HEMOGLOBIN: 11.6 g/dL — AB (ref 13.0–17.0)
Hemoglobin: 14.3 g/dL (ref 13.0–17.0)
Hemoglobin: 13.9 g/dL (ref 13.0–17.0)
Hemoglobin: 10.9 g/dL — ABNORMAL LOW (ref 13.0–17.0)
Hemoglobin: 9.9 g/dL — ABNORMAL LOW (ref 13.0–17.0)
POTASSIUM: 3.9 mmol/L (ref 3.5–5.1)
POTASSIUM: 4.5 mmol/L (ref 3.5–5.1)
POTASSIUM: 3.7 mmol/L (ref 3.5–5.1)
Potassium: 3.5 mmol/L (ref 3.5–5.1)
Potassium: 3.8 mmol/L (ref 3.5–5.1)
Potassium: 4.3 mmol/L (ref 3.5–5.1)
Potassium: 5 mmol/L (ref 3.5–5.1)
SODIUM: 141 mmol/L (ref 135–145)
Sodium: 140 mmol/L (ref 135–145)
Sodium: 141 mmol/L (ref 135–145)
Sodium: 141 mmol/L (ref 135–145)
Sodium: 140 mmol/L (ref 135–145)
Sodium: 141 mmol/L (ref 135–145)
Sodium: 141 mmol/L (ref 135–145)
TCO2: 29 mmol/L (ref 22–32)
TCO2: 26 mmol/L (ref 22–32)
TCO2: 28 mmol/L (ref 22–32)
TCO2: 27 mmol/L (ref 22–32)
TCO2: 26 mmol/L (ref 22–32)
TCO2: 25 mmol/L (ref 22–32)
TCO2: 24 mmol/L (ref 22–32)

## 2017-10-01 LAB — POCT I-STAT 3, ART BLOOD GAS (G3+)
ACID-BASE DEFICIT: 1 mmol/L (ref 0.0–2.0)
ACID-BASE DEFICIT: 3 mmol/L — AB (ref 0.0–2.0)
Acid-Base Excess: 1 mmol/L (ref 0.0–2.0)
Acid-base deficit: 6 mmol/L — ABNORMAL HIGH (ref 0.0–2.0)
BICARBONATE: 25.8 mmol/L (ref 20.0–28.0)
BICARBONATE: 23.7 mmol/L (ref 20.0–28.0)
BICARBONATE: 21.4 mmol/L (ref 20.0–28.0)
Bicarbonate: 23.3 mmol/L (ref 20.0–28.0)
O2 SAT: 99 %
O2 SAT: 99 %
O2 Saturation: 100 %
O2 Saturation: 98 %
PO2 ART: 145 mmHg — AB (ref 83.0–108.0)
Patient temperature: 37
TCO2: 27 mmol/L (ref 22–32)
TCO2: 25 mmol/L (ref 22–32)
TCO2: 25 mmol/L (ref 22–32)
TCO2: 23 mmol/L (ref 22–32)
pCO2 arterial: 40 mmHg (ref 32.0–48.0)
pCO2 arterial: 37.1 mmHg (ref 32.0–48.0)
pCO2 arterial: 47.5 mmHg (ref 32.0–48.0)
pCO2 arterial: 46.4 mmHg (ref 32.0–48.0)
pH, Arterial: 7.418 (ref 7.350–7.450)
pH, Arterial: 7.41 (ref 7.350–7.450)
pH, Arterial: 7.299 — ABNORMAL LOW (ref 7.350–7.450)
pH, Arterial: 7.271 — ABNORMAL LOW (ref 7.350–7.450)
pO2, Arterial: 334 mmHg — ABNORMAL HIGH (ref 83.0–108.0)
pO2, Arterial: 108 mmHg (ref 83.0–108.0)
pO2, Arterial: 157 mmHg — ABNORMAL HIGH (ref 83.0–108.0)

## 2017-10-01 LAB — BLOOD GAS, ARTERIAL
Acid-Base Excess: 2.3 mmol/L — ABNORMAL HIGH (ref 0.0–2.0)
BICARBONATE: 26.3 mmol/L (ref 20.0–28.0)
DRAWN BY: 449841
FIO2: 0.21
O2 SAT: 98.7 %
PATIENT TEMPERATURE: 98.6
PO2 ART: 116 mmHg — AB (ref 83.0–108.0)
pCO2 arterial: 40.6 mmHg (ref 32.0–48.0)
pH, Arterial: 7.428 (ref 7.350–7.450)

## 2017-10-01 LAB — CBC
HCT: 45.5 % (ref 39.0–52.0)
HCT: 36.4 % — ABNORMAL LOW (ref 39.0–52.0)
HCT: 37.2 % — ABNORMAL LOW (ref 39.0–52.0)
HEMOGLOBIN: 15.7 g/dL (ref 13.0–17.0)
Hemoglobin: 12.5 g/dL — ABNORMAL LOW (ref 13.0–17.0)
Hemoglobin: 12.7 g/dL — ABNORMAL LOW (ref 13.0–17.0)
MCH: 29.8 pg (ref 26.0–34.0)
MCH: 29.4 pg (ref 26.0–34.0)
MCH: 29.4 pg (ref 26.0–34.0)
MCHC: 34.5 g/dL (ref 30.0–36.0)
MCHC: 34.3 g/dL (ref 30.0–36.0)
MCHC: 34.1 g/dL (ref 30.0–36.0)
MCV: 86.5 fL (ref 78.0–100.0)
MCV: 85.6 fL (ref 78.0–100.0)
MCV: 86.1 fL (ref 78.0–100.0)
PLATELETS: 177 10*3/uL (ref 150–400)
PLATELETS: 174 10*3/uL (ref 150–400)
Platelets: 228 10*3/uL (ref 150–400)
RBC: 5.26 MIL/uL (ref 4.22–5.81)
RBC: 4.25 MIL/uL (ref 4.22–5.81)
RBC: 4.32 MIL/uL (ref 4.22–5.81)
RDW: 13.2 % (ref 11.5–15.5)
RDW: 13.2 % (ref 11.5–15.5)
RDW: 13.1 % (ref 11.5–15.5)
WBC: 5.5 10*3/uL (ref 4.0–10.5)
WBC: 15 10*3/uL — AB (ref 4.0–10.5)
WBC: 14.5 10*3/uL — ABNORMAL HIGH (ref 4.0–10.5)

## 2017-10-01 LAB — GLUCOSE, CAPILLARY
GLUCOSE-CAPILLARY: 118 mg/dL — AB (ref 65–99)
GLUCOSE-CAPILLARY: 112 mg/dL — AB (ref 65–99)
GLUCOSE-CAPILLARY: 127 mg/dL — AB (ref 65–99)
GLUCOSE-CAPILLARY: 141 mg/dL — AB (ref 65–99)
GLUCOSE-CAPILLARY: 132 mg/dL — AB (ref 65–99)
Glucose-Capillary: 85 mg/dL (ref 65–99)
Glucose-Capillary: 108 mg/dL — ABNORMAL HIGH (ref 65–99)
Glucose-Capillary: 105 mg/dL — ABNORMAL HIGH (ref 65–99)
Glucose-Capillary: 141 mg/dL — ABNORMAL HIGH (ref 65–99)
Glucose-Capillary: 176 mg/dL — ABNORMAL HIGH (ref 65–99)
Glucose-Capillary: 149 mg/dL — ABNORMAL HIGH (ref 65–99)

## 2017-10-01 LAB — CREATININE, SERUM
CREATININE: 0.76 mg/dL (ref 0.61–1.24)
GFR calc non Af Amer: >60 mL/min (ref >60)

## 2017-10-01 LAB — APTT
APTT: 34 s (ref 24–36)
aPTT: 31 seconds (ref 24–36)

## 2017-10-01 LAB — PLATELET COUNT: PLATELETS: 152 10*3/uL (ref 150–400)

## 2017-10-01 LAB — COMPREHENSIVE METABOLIC PANEL
ALBUMIN: 3.9 g/dL (ref 3.5–5.0)
ALT: 33 U/L (ref 17–63)
ANION GAP: 10 (ref 5–15)
AST: 32 U/L (ref 15–41)
Alkaline Phosphatase: 60 U/L (ref 38–126)
BUN: 8 mg/dL (ref 6–20)
CO2: 26 mmol/L (ref 22–32)
Calcium: 9.2 mg/dL (ref 8.9–10.3)
Chloride: 103 mmol/L (ref 101–111)
Creatinine, Ser: 0.85 mg/dL (ref 0.61–1.24)
GFR calc Af Amer: >60 mL/min (ref >60)
GFR calc non Af Amer: >60 mL/min (ref >60)
GLUCOSE: 121 mg/dL — AB (ref 65–99)
POTASSIUM: 4.5 mmol/L (ref 3.5–5.1)
SODIUM: 139 mmol/L (ref 135–145)
Total Bilirubin: 0.5 mg/dL (ref 0.3–1.2)
Total Protein: 6.4 g/dL — ABNORMAL LOW (ref 6.5–8.1)

## 2017-10-01 LAB — ABO/RH: ABO/RH(D): O POS

## 2017-10-01 LAB — MAGNESIUM: MAGNESIUM: 2.9 mg/dL — AB (ref 1.7–2.4)

## 2017-10-01 LAB — HEMOGLOBIN AND HEMATOCRIT, BLOOD
HEMATOCRIT: 33.1 % — AB (ref 39.0–52.0)
Hemoglobin: 11.6 g/dL — ABNORMAL LOW (ref 13.0–17.0)

## 2017-10-01 LAB — PROTIME-INR
INR: 1.07
INR: 1.54
PROTHROMBIN TIME: 18.4 s — AB (ref 11.4–15.2)
Prothrombin Time: 13.8 seconds (ref 11.4–15.2)

## 2017-10-01 LAB — POCT I-STAT 4, (NA,K, GLUC, HGB,HCT)
Glucose, Bld: 90 mg/dL (ref 65–99)
HEMATOCRIT: 35 % — AB (ref 39.0–52.0)
HEMOGLOBIN: 11.9 g/dL — AB (ref 13.0–17.0)
Potassium: 3.5 mmol/L (ref 3.5–5.1)
SODIUM: 145 mmol/L (ref 135–145)

## 2017-10-01 LAB — PREPARE RBC (CROSSMATCH)

## 2017-10-01 LAB — SURGICAL PCR SCREEN
MRSA, PCR: NEGATIVE
STAPHYLOCOCCUS AUREUS: NEGATIVE

## 2017-10-01 SURGERY — CORONARY ARTERY BYPASS GRAFTING (CABG)
Anesthesia: General | Site: Chest

## 2017-10-01 MED ORDER — 0.9 % SODIUM CHLORIDE (POUR BTL) OPTIME
TOPICAL | Status: DC | PRN
  Administered 2017-10-01: 5000 mL

## 2017-10-01 MED ORDER — PHENYLEPHRINE 40 MCG/ML (10ML) SYRINGE FOR IV PUSH (FOR BLOOD PRESSURE SUPPORT)
PREFILLED_SYRINGE | INTRAVENOUS | Status: AC
  Filled 2017-10-01: qty 10

## 2017-10-01 MED ORDER — FENTANYL CITRATE (PF) 250 MCG/5ML IJ SOLN
INTRAMUSCULAR | Status: DC | PRN
  Administered 2017-10-01: 150 ug via INTRAVENOUS
  Administered 2017-10-01: 100 ug via INTRAVENOUS
  Administered 2017-10-01: 900 ug via INTRAVENOUS
  Administered 2017-10-01: 100 ug via INTRAVENOUS

## 2017-10-01 MED ORDER — PROTAMINE SULFATE 10 MG/ML IV SOLN
INTRAVENOUS | Status: AC
  Filled 2017-10-01: qty 25

## 2017-10-01 MED ORDER — LACTATED RINGERS IV SOLN: INTRAVENOUS | Status: DC

## 2017-10-01 MED ORDER — HEPARIN SODIUM (PORCINE) 1000 UNIT/ML IJ SOLN
INTRAMUSCULAR | Status: DC | PRN
  Administered 2017-10-01: 24000 [IU] via INTRAVENOUS
  Administered 2017-10-01: 2000 [IU] via INTRAVENOUS

## 2017-10-01 MED ORDER — HEMOSTATIC AGENTS (NO CHARGE) OPTIME
TOPICAL | Status: DC | PRN
  Administered 2017-10-01: 1 via TOPICAL

## 2017-10-01 MED ORDER — ASPIRIN EC 325 MG PO TBEC
325.0000 mg | DELAYED_RELEASE_TABLET | Freq: Every day | ORAL | Status: DC
  Administered 2017-10-02 – 2017-10-05 (×4): 325 mg via ORAL
  Filled 2017-10-01 (×4): qty 1

## 2017-10-01 MED ORDER — BISACODYL 10 MG RE SUPP: 10.0000 mg | Freq: Every day | RECTAL | Status: DC

## 2017-10-01 MED ORDER — ALBUMIN HUMAN 5 % IV SOLN
INTRAVENOUS | Status: DC | PRN
  Administered 2017-10-01 (×2): via INTRAVENOUS

## 2017-10-01 MED ORDER — LEVOTHYROXINE SODIUM 75 MCG PO TABS
175.0000 ug | ORAL_TABLET | Freq: Every day | ORAL | Status: DC
  Administered 2017-10-02 – 2017-10-05 (×4): 175 ug via ORAL
  Filled 2017-10-01 (×4): qty 1

## 2017-10-01 MED ORDER — PROTAMINE SULFATE 10 MG/ML IV SOLN
INTRAVENOUS | Status: DC | PRN
  Administered 2017-10-01: 250 mg via INTRAVENOUS

## 2017-10-01 MED ORDER — ASPIRIN 81 MG PO CHEW
324.0000 mg | CHEWABLE_TABLET | Freq: Every day | ORAL | Status: DC
  Filled 2017-10-01: qty 4

## 2017-10-01 MED ORDER — LACTATED RINGERS IV SOLN
INTRAVENOUS | Status: DC | PRN
  Administered 2017-10-01 (×2): via INTRAVENOUS

## 2017-10-01 MED ORDER — NITROGLYCERIN IN D5W 200-5 MCG/ML-% IV SOLN: 0.0000 ug/min | INTRAVENOUS | Status: DC

## 2017-10-01 MED ORDER — PROTAMINE SULFATE 10 MG/ML IV SOLN
INTRAVENOUS | Status: AC
  Filled 2017-10-01: qty 5

## 2017-10-01 MED ORDER — SODIUM CHLORIDE 0.9 % IV SOLN
INTRAVENOUS | Status: DC | PRN
  Administered 2017-10-01: 07:00:00 via INTRAVENOUS

## 2017-10-01 MED ORDER — CHLORHEXIDINE GLUCONATE CLOTH 2 % EX PADS
6.0000 | MEDICATED_PAD | Freq: Every day | CUTANEOUS | Status: DC
  Administered 2017-10-01: 6 via TOPICAL

## 2017-10-01 MED ORDER — ISOSORBIDE MONONITRATE ER 30 MG PO TB24
30.0000 mg | ORAL_TABLET | Freq: Every day | ORAL | Status: DC
  Administered 2017-10-02 – 2017-10-03 (×2): 30 mg via ORAL
  Filled 2017-10-01 (×2): qty 1

## 2017-10-01 MED ORDER — ROCURONIUM BROMIDE 10 MG/ML (PF) SYRINGE
PREFILLED_SYRINGE | INTRAVENOUS | Status: DC | PRN
  Administered 2017-10-01: 50 mg via INTRAVENOUS
  Administered 2017-10-01: 25 mg via INTRAVENOUS
  Administered 2017-10-01 (×3): 50 mg via INTRAVENOUS

## 2017-10-01 MED ORDER — SODIUM CHLORIDE 0.9% FLUSH: 10.0000 mL | Freq: Two times a day (BID) | INTRAVENOUS | Status: DC

## 2017-10-01 MED ORDER — HEPARIN SODIUM (PORCINE) 1000 UNIT/ML IJ SOLN
INTRAMUSCULAR | Status: AC
  Filled 2017-10-01: qty 1

## 2017-10-01 MED ORDER — METOPROLOL TARTRATE 25 MG/10 ML ORAL SUSPENSION: 12.5000 mg | Freq: Two times a day (BID) | ORAL | Status: DC

## 2017-10-01 MED ORDER — INSULIN REGULAR BOLUS VIA INFUSION
0.0000 [IU] | Freq: Three times a day (TID) | INTRAVENOUS | Status: DC
  Filled 2017-10-01: qty 10

## 2017-10-01 MED ORDER — CHLORHEXIDINE GLUCONATE 0.12 % MT SOLN
15.0000 mL | OROMUCOSAL | Status: AC
  Administered 2017-10-01: 15 mL via OROMUCOSAL

## 2017-10-01 MED ORDER — DEXMEDETOMIDINE HCL IN NACL 200 MCG/50ML IV SOLN
0.0000 ug/kg/h | INTRAVENOUS | Status: DC
  Administered 2017-10-01: 0.7 ug/kg/h via INTRAVENOUS

## 2017-10-01 MED ORDER — SODIUM CHLORIDE 0.9 % IV SOLN
0.0000 ug/min | INTRAVENOUS | Status: DC
  Administered 2017-10-01: 10 ug/min via INTRAVENOUS
  Filled 2017-10-01: qty 2

## 2017-10-01 MED ORDER — SODIUM BICARBONATE 8.4 % IV SOLN
50.0000 meq | Freq: Once | INTRAVENOUS | Status: AC
  Administered 2017-10-01: 50 meq via INTRAVENOUS

## 2017-10-01 MED ORDER — CHLORHEXIDINE GLUCONATE 0.12% ORAL RINSE (MEDLINE KIT)
15.0000 mL | Freq: Two times a day (BID) | OROMUCOSAL | Status: DC
  Administered 2017-10-01: 15 mL via OROMUCOSAL

## 2017-10-01 MED ORDER — ONDANSETRON HCL 4 MG/2ML IJ SOLN
4.0000 mg | Freq: Four times a day (QID) | INTRAMUSCULAR | Status: DC | PRN
  Administered 2017-10-01 – 2017-10-05 (×8): 4 mg via INTRAVENOUS
  Filled 2017-10-01 (×8): qty 2

## 2017-10-01 MED ORDER — FAMOTIDINE 20 MG IN NS 100 ML IVPB
20.0000 mg | Freq: Two times a day (BID) | INTRAVENOUS | Status: AC
  Administered 2017-10-01: 20 mg via INTRAVENOUS
  Filled 2017-10-01 (×2): qty 100

## 2017-10-01 MED ORDER — POTASSIUM CHLORIDE 10 MEQ/50ML IV SOLN
10.0000 meq | Freq: Once | INTRAVENOUS | Status: AC
  Administered 2017-10-01: 10 meq via INTRAVENOUS

## 2017-10-01 MED ORDER — LACTATED RINGERS IV SOLN
INTRAVENOUS | Status: DC | PRN
  Administered 2017-10-01: 07:00:00 via INTRAVENOUS

## 2017-10-01 MED ORDER — SODIUM CHLORIDE 0.9% FLUSH: 3.0000 mL | INTRAVENOUS | Status: DC | PRN

## 2017-10-01 MED ORDER — DEXTROSE 50 % IV SOLN
INTRAVENOUS | Status: DC | PRN
  Administered 2017-10-01: 15 mL via INTRAVENOUS

## 2017-10-01 MED ORDER — SODIUM CHLORIDE 0.9 % IV SOLN
INTRAVENOUS | Status: DC
  Administered 2017-10-01: 0.8 [IU]/h via INTRAVENOUS
  Filled 2017-10-01 (×2): qty 1

## 2017-10-01 MED ORDER — SODIUM CHLORIDE 0.9 % IV SOLN: Freq: Once | INTRAVENOUS | Status: DC

## 2017-10-01 MED ORDER — PANTOPRAZOLE SODIUM 40 MG PO TBEC
40.0000 mg | DELAYED_RELEASE_TABLET | Freq: Every day | ORAL | Status: DC
  Administered 2017-10-03 – 2017-10-05 (×3): 40 mg via ORAL
  Filled 2017-10-01 (×3): qty 1

## 2017-10-01 MED ORDER — ALBUMIN HUMAN 5 % IV SOLN
250.0000 mL | INTRAVENOUS | Status: AC | PRN
  Administered 2017-10-01 (×3): 250 mL via INTRAVENOUS
  Filled 2017-10-01: qty 250

## 2017-10-01 MED ORDER — DOCUSATE SODIUM 100 MG PO CAPS
200.0000 mg | ORAL_CAPSULE | Freq: Every day | ORAL | Status: DC
  Administered 2017-10-02 – 2017-10-05 (×4): 200 mg via ORAL
  Filled 2017-10-01 (×4): qty 2

## 2017-10-01 MED ORDER — TRAMADOL HCL 50 MG PO TABS
50.0000 mg | ORAL_TABLET | ORAL | Status: DC | PRN
  Administered 2017-10-02: 100 mg via ORAL
  Filled 2017-10-01: qty 2

## 2017-10-01 MED ORDER — SODIUM CHLORIDE 0.9% FLUSH
3.0000 mL | Freq: Two times a day (BID) | INTRAVENOUS | Status: DC
  Administered 2017-10-02 – 2017-10-03 (×2): 3 mL via INTRAVENOUS

## 2017-10-01 MED ORDER — ROCURONIUM BROMIDE 10 MG/ML (PF) SYRINGE
PREFILLED_SYRINGE | INTRAVENOUS | Status: AC
  Filled 2017-10-01: qty 10

## 2017-10-01 MED ORDER — ACETAMINOPHEN 500 MG PO TABS
1000.0000 mg | ORAL_TABLET | Freq: Four times a day (QID) | ORAL | Status: DC
  Administered 2017-10-01 – 2017-10-05 (×13): 1000 mg via ORAL
  Filled 2017-10-01 (×12): qty 2

## 2017-10-01 MED ORDER — MORPHINE SULFATE (PF) 2 MG/ML IV SOLN: 1.0000 mg | INTRAVENOUS | Status: DC | PRN

## 2017-10-01 MED ORDER — METOPROLOL TARTRATE 5 MG/5ML IV SOLN: 2.5000 mg | INTRAVENOUS | Status: DC | PRN

## 2017-10-01 MED ORDER — FENTANYL CITRATE (PF) 250 MCG/5ML IJ SOLN
INTRAMUSCULAR | Status: AC
  Filled 2017-10-01: qty 5

## 2017-10-01 MED ORDER — BISACODYL 5 MG PO TBEC
10.0000 mg | DELAYED_RELEASE_TABLET | Freq: Every day | ORAL | Status: DC
  Administered 2017-10-02 – 2017-10-05 (×3): 10 mg via ORAL
  Filled 2017-10-01 (×4): qty 2

## 2017-10-01 MED ORDER — METOPROLOL TARTRATE 12.5 MG HALF TABLET
12.5000 mg | ORAL_TABLET | Freq: Two times a day (BID) | ORAL | Status: DC
  Administered 2017-10-02 (×2): 12.5 mg via ORAL
  Filled 2017-10-01 (×3): qty 1

## 2017-10-01 MED ORDER — SODIUM CHLORIDE 0.9 % IV SOLN
0.0000 ug/kg/h | INTRAVENOUS | Status: DC
  Filled 2017-10-01: qty 2

## 2017-10-01 MED ORDER — OXYCODONE HCL 5 MG PO TABS
5.0000 mg | ORAL_TABLET | ORAL | Status: DC | PRN
  Administered 2017-10-02 – 2017-10-05 (×16): 10 mg via ORAL
  Filled 2017-10-01 (×16): qty 2

## 2017-10-01 MED ORDER — MORPHINE SULFATE (PF) 4 MG/ML IV SOLN
1.0000 mg | INTRAVENOUS | Status: AC | PRN
Start: 2017-10-01 — End: 2017-10-02

## 2017-10-01 MED ORDER — MORPHINE SULFATE (PF) 4 MG/ML IV SOLN
2.0000 mg | INTRAVENOUS | Status: DC | PRN
  Administered 2017-10-01 – 2017-10-02 (×5): 2 mg via INTRAVENOUS
  Filled 2017-10-01 (×4): qty 1

## 2017-10-01 MED ORDER — MIDAZOLAM HCL 10 MG/2ML IJ SOLN
INTRAMUSCULAR | Status: AC
  Filled 2017-10-01: qty 2

## 2017-10-01 MED ORDER — POTASSIUM CHLORIDE 10 MEQ/50ML IV SOLN
10.0000 meq | INTRAVENOUS | Status: AC
  Administered 2017-10-01 (×3): 10 meq via INTRAVENOUS
  Filled 2017-10-01: qty 50

## 2017-10-01 MED ORDER — MORPHINE SULFATE (PF) 2 MG/ML IV SOLN: 2.0000 mg | INTRAVENOUS | Status: DC | PRN

## 2017-10-01 MED ORDER — MUPIROCIN 2 % EX OINT
TOPICAL_OINTMENT | CUTANEOUS | Status: AC
  Administered 2017-10-01: 1 via TOPICAL
  Filled 2017-10-01: qty 22

## 2017-10-01 MED ORDER — SODIUM CHLORIDE 0.9 % IV SOLN: INTRAVENOUS | Status: DC

## 2017-10-01 MED ORDER — LACTATED RINGERS IV SOLN: 500.0000 mL | Freq: Once | INTRAVENOUS | Status: DC | PRN

## 2017-10-01 MED ORDER — PHENYLEPHRINE HCL 10 MG/ML IJ SOLN
INTRAVENOUS | Status: DC | PRN
  Administered 2017-10-01: 25 ug/min via INTRAVENOUS

## 2017-10-01 MED ORDER — LACTATED RINGERS IV SOLN
INTRAVENOUS | Status: DC
  Administered 2017-10-01: 14:00:00 via INTRAVENOUS

## 2017-10-01 MED ORDER — DEXTROSE 50 % IV SOLN
INTRAVENOUS | Status: AC
  Filled 2017-10-01: qty 50

## 2017-10-01 MED ORDER — SODIUM CHLORIDE 0.9 % IV SOLN
1.5000 g | Freq: Two times a day (BID) | INTRAVENOUS | Status: AC
  Administered 2017-10-01 – 2017-10-03 (×4): 1.5 g via INTRAVENOUS
  Filled 2017-10-01 (×4): qty 1.5

## 2017-10-01 MED ORDER — ACETAMINOPHEN 650 MG RE SUPP
650.0000 mg | Freq: Once | RECTAL | Status: AC
  Administered 2017-10-01: 650 mg via RECTAL

## 2017-10-01 MED ORDER — ACETAMINOPHEN 160 MG/5ML PO SOLN: 1000.0000 mg | Freq: Four times a day (QID) | ORAL | Status: DC

## 2017-10-01 MED ORDER — ORAL CARE MOUTH RINSE
15.0000 mL | Freq: Two times a day (BID) | OROMUCOSAL | Status: DC
  Administered 2017-10-03 – 2017-10-05 (×4): 15 mL via OROMUCOSAL

## 2017-10-01 MED ORDER — DOPAMINE-DEXTROSE 3.2-5 MG/ML-% IV SOLN
0.0000 ug/kg/min | INTRAVENOUS | Status: DC
Start: 2017-10-01 — End: 2017-10-03
  Administered 2017-10-01: 3 ug/kg/min via INTRAVENOUS

## 2017-10-01 MED ORDER — MIDAZOLAM HCL 2 MG/2ML IJ SOLN
2.0000 mg | INTRAMUSCULAR | Status: DC | PRN
  Administered 2017-10-01: 2 mg via INTRAVENOUS
  Filled 2017-10-01: qty 2

## 2017-10-01 MED ORDER — MAGNESIUM SULFATE 4 GM/100ML IV SOLN
4.0000 g | Freq: Once | INTRAVENOUS | Status: AC
  Administered 2017-10-01: 4 g via INTRAVENOUS
  Filled 2017-10-01: qty 100

## 2017-10-01 MED ORDER — FENTANYL CITRATE (PF) 250 MCG/5ML IJ SOLN
INTRAMUSCULAR | Status: AC
  Filled 2017-10-01: qty 20

## 2017-10-01 MED ORDER — NITROGLYCERIN IN D5W 200-5 MCG/ML-% IV SOLN: 7.0000 ug/min | INTRAVENOUS | Status: AC

## 2017-10-01 MED ORDER — ORAL CARE MOUTH RINSE: 15.0000 mL | Freq: Four times a day (QID) | OROMUCOSAL | Status: DC

## 2017-10-01 MED ORDER — ONDANSETRON HCL 4 MG/2ML IJ SOLN
INTRAMUSCULAR | Status: AC
  Filled 2017-10-01: qty 2

## 2017-10-01 MED ORDER — MUPIROCIN 2 % EX OINT
1.0000 "application " | TOPICAL_OINTMENT | Freq: Once | CUTANEOUS | Status: AC
  Administered 2017-10-01: 1 via TOPICAL

## 2017-10-01 MED ORDER — SODIUM CHLORIDE 0.9 % IV SOLN: 250.0000 mL | INTRAVENOUS | Status: DC

## 2017-10-01 MED ORDER — SODIUM CHLORIDE 0.45 % IV SOLN
INTRAVENOUS | Status: DC | PRN
  Administered 2017-10-01: 14:00:00 via INTRAVENOUS

## 2017-10-01 MED ORDER — SODIUM CHLORIDE 0.9% FLUSH: 10.0000 mL | INTRAVENOUS | Status: DC | PRN

## 2017-10-01 MED ORDER — VANCOMYCIN HCL IN DEXTROSE 1-5 GM/200ML-% IV SOLN
1000.0000 mg | Freq: Once | INTRAVENOUS | Status: AC
  Administered 2017-10-01: 1000 mg via INTRAVENOUS
  Filled 2017-10-01: qty 200

## 2017-10-01 MED ORDER — MIDAZOLAM HCL 2 MG/2ML IJ SOLN
INTRAMUSCULAR | Status: AC
  Filled 2017-10-01: qty 2

## 2017-10-01 MED ORDER — PROPOFOL 10 MG/ML IV BOLUS
INTRAVENOUS | Status: AC
  Filled 2017-10-01: qty 20

## 2017-10-01 MED ORDER — MIDAZOLAM HCL 5 MG/5ML IJ SOLN
INTRAMUSCULAR | Status: DC | PRN
  Administered 2017-10-01 (×2): 2 mg via INTRAVENOUS
  Administered 2017-10-01: 3 mg via INTRAVENOUS
  Administered 2017-10-01: 2 mg via INTRAVENOUS
  Administered 2017-10-01: 1 mg via INTRAVENOUS
  Administered 2017-10-01: 2 mg via INTRAVENOUS

## 2017-10-01 MED ORDER — LEVALBUTEROL HCL 0.63 MG/3ML IN NEBU: 0.6300 mg | INHALATION_SOLUTION | Freq: Four times a day (QID) | RESPIRATORY_TRACT | Status: DC | PRN

## 2017-10-01 MED ORDER — ACETAMINOPHEN 160 MG/5ML PO SOLN: 650.0000 mg | Freq: Once | ORAL | Status: AC

## 2017-10-01 MED ORDER — GELATIN ABSORBABLE MT POWD
OROMUCOSAL | Status: DC | PRN
  Administered 2017-10-01: 4 mL via TOPICAL

## 2017-10-01 MED ORDER — ATORVASTATIN CALCIUM 40 MG PO TABS
40.0000 mg | ORAL_TABLET | Freq: Every day | ORAL | Status: DC
  Administered 2017-10-02 – 2017-10-04 (×3): 40 mg via ORAL
  Filled 2017-10-01 (×3): qty 1

## 2017-10-01 MED ORDER — PROPOFOL 10 MG/ML IV BOLUS
INTRAVENOUS | Status: DC | PRN
  Administered 2017-10-01: 50 mg via INTRAVENOUS

## 2017-10-01 MED ORDER — CHLORHEXIDINE GLUCONATE 4 % EX LIQD: 30.0000 mL | CUTANEOUS | Status: DC

## 2017-10-01 SURGICAL SUPPLY — 109 items
APPLIER CLIP 9.375 SM OPEN (CLIP)
BAG DECANTER FOR FLEXI CONT (MISCELLANEOUS) ×5 IMPLANT
BANDAGE ACE 4X5 VEL STRL LF (GAUZE/BANDAGES/DRESSINGS) ×5 IMPLANT
BANDAGE ACE 6X5 VEL STRL LF (GAUZE/BANDAGES/DRESSINGS) ×5 IMPLANT
BANDAGE ELASTIC 4 VELCRO ST LF (GAUZE/BANDAGES/DRESSINGS) ×5 IMPLANT
BANDAGE ELASTIC 6 VELCRO ST LF (GAUZE/BANDAGES/DRESSINGS) ×5 IMPLANT
BASKET HEART  (ORDER IN 25'S) (MISCELLANEOUS) ×1
BASKET HEART (ORDER IN 25'S) (MISCELLANEOUS) ×1
BASKET HEART (ORDER IN 25S) (MISCELLANEOUS) ×3 IMPLANT
BLADE NEEDLE 3 SS STRL (BLADE) ×4 IMPLANT
BLADE NEEDLE 3MM SS STRL (BLADE) ×1
BLADE STERNUM SYSTEM 6 (BLADE) ×5 IMPLANT
BLADE SURG 15 STRL LF DISP TIS (BLADE) ×3 IMPLANT
BLADE SURG 15 STRL SS (BLADE) ×2
BNDG GAUZE ELAST 4 BULKY (GAUZE/BANDAGES/DRESSINGS) ×10 IMPLANT
CANISTER SUCT 3000ML PPV (MISCELLANEOUS) ×5 IMPLANT
CANNULA EZ GLIDE AORTIC 21FR (CANNULA) ×5 IMPLANT
CATH CPB KIT HENDRICKSON (MISCELLANEOUS) ×5 IMPLANT
CATH ROBINSON RED A/P 18FR (CATHETERS) ×5 IMPLANT
CATH THORACIC 36FR (CATHETERS) ×5 IMPLANT
CATH THORACIC 36FR RT ANG (CATHETERS) ×5 IMPLANT
CLIP APPLIE 9.375 SM OPEN (CLIP) IMPLANT
CLIP FOGARTY SPRING 6M (CLIP) ×5 IMPLANT
CLIP VESOCCLUDE MED 24/CT (CLIP) ×5 IMPLANT
CLIP VESOCCLUDE SM WIDE 24/CT (CLIP) ×15 IMPLANT
COVER MAYO STAND STRL (DRAPES) ×5 IMPLANT
CRADLE DONUT ADULT HEAD (MISCELLANEOUS) ×5 IMPLANT
DERMABOND ADVANCED (GAUZE/BANDAGES/DRESSINGS) ×2
DERMABOND ADVANCED .7 DNX12 (GAUZE/BANDAGES/DRESSINGS) ×3 IMPLANT
DRAPE CARDIOVASCULAR INCISE (DRAPES) ×2
DRAPE HALF SHEET 40X57 (DRAPES) ×5 IMPLANT
DRAPE SLUSH/WARMER DISC (DRAPES) ×5 IMPLANT
DRAPE SRG 135X102X78XABS (DRAPES) ×3 IMPLANT
DRSG COVADERM 4X14 (GAUZE/BANDAGES/DRESSINGS) ×5 IMPLANT
ELECT REM PT RETURN 9FT ADLT (ELECTROSURGICAL) ×10
ELECTRODE REM PT RTRN 9FT ADLT (ELECTROSURGICAL) ×6 IMPLANT
FELT TEFLON 1X6 (MISCELLANEOUS) ×5 IMPLANT
GAUZE SPONGE 4X4 12PLY STRL (GAUZE/BANDAGES/DRESSINGS) ×10 IMPLANT
GAUZE SPONGE 4X4 12PLY STRL LF (GAUZE/BANDAGES/DRESSINGS) ×15 IMPLANT
GEL ULTRASOUND 20GR AQUASONIC (MISCELLANEOUS) ×5 IMPLANT
GLOVE BIO SURGEON STRL SZ 6 (GLOVE) ×15 IMPLANT
GLOVE BIO SURGEON STRL SZ 6.5 (GLOVE) ×32 IMPLANT
GLOVE BIO SURGEONS STRL SZ 6.5 (GLOVE) ×8
GLOVE BIOGEL PI IND STRL 6 (GLOVE) ×12 IMPLANT
GLOVE BIOGEL PI IND STRL 6.5 (GLOVE) ×18 IMPLANT
GLOVE BIOGEL PI INDICATOR 6 (GLOVE) ×8
GLOVE BIOGEL PI INDICATOR 6.5 (GLOVE) ×12
GLOVE SURG SIGNA 7.5 PF LTX (GLOVE) ×20 IMPLANT
GOWN STRL REUS W/ TWL LRG LVL3 (GOWN DISPOSABLE) ×24 IMPLANT
GOWN STRL REUS W/ TWL XL LVL3 (GOWN DISPOSABLE) ×6 IMPLANT
GOWN STRL REUS W/TWL LRG LVL3 (GOWN DISPOSABLE) ×16
GOWN STRL REUS W/TWL XL LVL3 (GOWN DISPOSABLE) ×4
HARMONIC SHEARS 14CM COAG (MISCELLANEOUS) IMPLANT
HEMOSTAT POWDER SURGIFOAM 1G (HEMOSTASIS) ×15 IMPLANT
HEMOSTAT SURGICEL 2X14 (HEMOSTASIS) ×5 IMPLANT
INSERT FOGARTY XLG (MISCELLANEOUS) IMPLANT
KIT BASIN OR (CUSTOM PROCEDURE TRAY) ×5 IMPLANT
KIT ROOM TURNOVER OR (KITS) ×5 IMPLANT
KIT SUCTION CATH 14FR (SUCTIONS) ×15 IMPLANT
KIT VASOVIEW HEMOPRO VH 3000 (KITS) ×5 IMPLANT
MARKER GRAFT CORONARY BYPASS (MISCELLANEOUS) ×15 IMPLANT
NS IRRIG 1000ML POUR BTL (IV SOLUTION) ×30 IMPLANT
PACK E OPEN HEART (SUTURE) ×5 IMPLANT
PACK OPEN HEART (CUSTOM PROCEDURE TRAY) ×5 IMPLANT
PAD ARMBOARD 7.5X6 YLW CONV (MISCELLANEOUS) ×10 IMPLANT
PAD ELECT DEFIB RADIOL ZOLL (MISCELLANEOUS) ×5 IMPLANT
PENCIL BUTTON HOLSTER BLD 10FT (ELECTRODE) ×5 IMPLANT
PUNCH AORTIC ROTATE  4.5MM 8IN (MISCELLANEOUS) ×5 IMPLANT
PUNCH AORTIC ROTATE 4.0MM (MISCELLANEOUS) IMPLANT
PUNCH AORTIC ROTATE 4.5MM 8IN (MISCELLANEOUS) IMPLANT
PUNCH AORTIC ROTATE 5MM 8IN (MISCELLANEOUS) IMPLANT
SET CARDIOPLEGIA MPS 5001102 (MISCELLANEOUS) ×5 IMPLANT
SHEARS HARMONIC 9CM CVD (BLADE) ×10 IMPLANT
SOLUTION ANTI FOG 6CC (MISCELLANEOUS) ×5 IMPLANT
SPONGE INTESTINAL PEANUT (DISPOSABLE) ×5 IMPLANT
SPONGE LAP 18X18 5 PK (GAUZE/BANDAGES/DRESSINGS) ×10 IMPLANT
SUT BONE WAX W31G (SUTURE) ×5 IMPLANT
SUT MNCRL AB 4-0 PS2 18 (SUTURE) ×15 IMPLANT
SUT PROLENE 3 0 SH DA (SUTURE) ×5 IMPLANT
SUT PROLENE 4 0 RB 1 (SUTURE) ×4
SUT PROLENE 4 0 SH DA (SUTURE) IMPLANT
SUT PROLENE 4-0 RB1 .5 CRCL 36 (SUTURE) ×6 IMPLANT
SUT PROLENE 5 0 C 1 36 (SUTURE) ×5 IMPLANT
SUT PROLENE 6 0 C 1 30 (SUTURE) ×20 IMPLANT
SUT PROLENE 7 0 BV1 MDA (SUTURE) ×10 IMPLANT
SUT PROLENE 8 0 BV175 6 (SUTURE) ×15 IMPLANT
SUT STEEL 6MS V (SUTURE) ×5 IMPLANT
SUT STEEL STERNAL CCS#1 18IN (SUTURE) IMPLANT
SUT STEEL SZ 6 DBL 3X14 BALL (SUTURE) ×5 IMPLANT
SUT VIC AB 1 CTX 36 (SUTURE) ×4
SUT VIC AB 1 CTX36XBRD ANBCTR (SUTURE) ×6 IMPLANT
SUT VIC AB 2-0 CT1 27 (SUTURE) ×4
SUT VIC AB 2-0 CT1 TAPERPNT 27 (SUTURE) ×6 IMPLANT
SUT VIC AB 2-0 CTX 27 (SUTURE) IMPLANT
SUT VIC AB 3-0 SH 27 (SUTURE) ×2
SUT VIC AB 3-0 SH 27X BRD (SUTURE) ×3 IMPLANT
SUT VIC AB 3-0 X1 27 (SUTURE) IMPLANT
SUT VICRYL 4-0 PS2 18IN ABS (SUTURE) IMPLANT
SYR 50ML SLIP (SYRINGE) IMPLANT
SYSTEM SAHARA CHEST DRAIN ATS (WOUND CARE) ×5 IMPLANT
TAPE CLOTH SURG 4X10 WHT LF (GAUZE/BANDAGES/DRESSINGS) ×5 IMPLANT
TAPE PAPER 2X10 WHT MICROPORE (GAUZE/BANDAGES/DRESSINGS) ×5 IMPLANT
TOWEL GREEN STERILE (TOWEL DISPOSABLE) ×5 IMPLANT
TOWEL GREEN STERILE FF (TOWEL DISPOSABLE) ×5 IMPLANT
TRAY FOLEY SILVER 16FR TEMP (SET/KITS/TRAYS/PACK) ×5 IMPLANT
TUBE FEEDING 8FR 16IN STR KANG (MISCELLANEOUS) ×5 IMPLANT
TUBING INSUFFLATION (TUBING) ×5 IMPLANT
UNDERPAD 30X30 (UNDERPADS AND DIAPERS) ×5 IMPLANT
WATER STERILE IRR 1000ML POUR (IV SOLUTION) ×10 IMPLANT

## 2017-10-01 NOTE — Anesthesia Postprocedure Evaluation (Signed)
Anesthesia Post Note  Patient: Louis Byrd  Procedure(s) Performed: CORONARY ARTERY BYPASS GRAFTING (CABG) x5 using the left internal mammary artery, the right greater saphenous vein harvested endoscopically, and the left radial artery. (N/A Chest) TRANSESOPHAGEAL ECHOCARDIOGRAM (TEE) (N/A ) RADIAL ARTERY HARVEST (Left )     Patient location during evaluation: SICU Anesthesia Type: General Level of consciousness: sedated Pain management: pain level controlled Vital Signs Assessment: post-procedure vital signs reviewed and stable Respiratory status: patient remains intubated per anesthesia plan Cardiovascular status: stable Postop Assessment: no apparent nausea or vomiting Anesthetic complications: no    Last Vitals:  Vitals:   10/01/17 1515 10/01/17 1530  BP:    Pulse:    Resp: 12 12  Temp: (!) 36.2 C (!) 36.3 C  SpO2: 100% 100%    Last Pain:  Vitals:   10/01/17 0630  TempSrc:   PainSc: 0-No pain                 Zeph Riebel DAVID

## 2017-10-01 NOTE — Progress Notes (Signed)
RT called to patient room to attempt to do rapid wean protocol on patient.  RT attempted twice and both times patient was too sleepy.  Will attempt again once patient is more awake.

## 2017-10-01 NOTE — Progress Notes (Signed)
      [address]       [place] [id number]             [phone]      S/p CABG x 5  Just extubated  BP 95/76   Pulse 92   Temp 98.6 F (37 C)   Resp 17   Ht 5\' 9"  (1.753 m)   Wt 183 lb (83 kg)   SpO2 100%   BMI 27.02 kg/m   Intake/Output Summary (Last 24 hours) at 10/01/2017 2004 Last data filed at 10/01/2017 1901 Gross per 24 hour  Intake 4344.28 ml  Output 4230 ml  Net 114.28 ml   Ci= 2.3  Doing well early postop  Louis SpareSteven C. Dorris FetchHendrickson, MD [facility] [phone]

## 2017-10-01 NOTE — Anesthesia Procedure Notes (Signed)
Procedure Name: Intubation Date/Time: 10/01/2017 7:51 AM Performed by: Inda Coke, CRNA Pre-anesthesia Checklist: Patient identified, Emergency Drugs available, Suction available and Patient being monitored Patient Re-evaluated:Patient Re-evaluated prior to induction Oxygen Delivery Method: Circle System Utilized Preoxygenation: Pre-oxygenation with 100% oxygen Induction Type: IV induction Ventilation: Mask ventilation without difficulty and Oral airway inserted - appropriate to patient size Laryngoscope Size: Mac and 4 Grade View: Grade I Tube type: Oral Number of attempts: 1 Airway Equipment and Method: Stylet and Oral airway Placement Confirmation: ETT inserted through vocal cords under direct vision,  positive ETCO2 and breath sounds checked- equal and bilateral Secured at: 23 cm Tube secured with: Tape Dental Injury: Teeth and Oropharynx as per pre-operative assessment

## 2017-10-01 NOTE — Interval H&P Note (Signed)
History and Physical Interval Note:  Normal Tymar's test on left  10/01/2017 7:22 AM  Louis Byrd  has presented today for surgery, with the diagnosis of CAD  The various methods of treatment have been discussed with the patient and family. After consideration of risks, benefits and other options for treatment, the patient has consented to  Procedure(s): CORONARY ARTERY BYPASS GRAFTING (CABG) (N/A) TRANSESOPHAGEAL ECHOCARDIOGRAM (TEE) (N/A) RADIAL ARTERY HARVEST (Left) as a surgical intervention .  The patient's history has been reviewed, patient examined, no change in status, stable for surgery.  I have reviewed the patient's chart and labs.  Questions were answered to the patient's satisfaction.     Loreli SlotSteven C Demarqus Jocson

## 2017-10-01 NOTE — Anesthesia Procedure Notes (Signed)
Central Venous Catheter Insertion Performed by: Gaynelle AduFitzgerald, Macrina Lehnert, MD, anesthesiologist Start/End2/18/2019 6:45 AM, 10/01/2017 7:00 AM Patient location: Pre-op. Preanesthetic checklist: patient identified, IV checked, site marked, risks and benefits discussed, surgical consent, monitors and equipment checked, pre-op evaluation, timeout performed and anesthesia consent Position: Trendelenburg Lidocaine 1% used for infiltration and patient sedated Hand hygiene performed , maximum sterile barriers used  and Seldinger technique used Catheter size: 8.5 Fr Total catheter length 10. Central line was placed.Sheath introducer Swan type:thermodilution Procedure performed using ultrasound guided technique. Ultrasound Notes:anatomy identified, needle tip was noted to be adjacent to the nerve/plexus identified, no ultrasound evidence of intravascular and/or intraneural injection and image(s) printed for medical record Attempts: 1 Following insertion, line sutured, dressing applied and Biopatch. Post procedure assessment: blood return through all ports, free fluid flow and no air  Patient tolerated the procedure well with no immediate complications.

## 2017-10-01 NOTE — Brief Op Note (Signed)
10/01/2017  1:34 PM  PATIENT:  Louis GuilesAllen M Sangalang  42 y.o. male  PRE-OPERATIVE DIAGNOSIS:  CAD  POST-OPERATIVE DIAGNOSIS:  CAD  PROCEDURE:  TRANSESOPHAGEAL ECHOCARDIOGRAM (TEE), MEDIAN STERNOTOMY for CORONARY ARTERY BYPASS GRAFTING (CABG) x 5  LIMA to LAD,   Left Radial artery to OM2  SVG to DIAGONAL,   Sequential SVG to PDA and PL EVH Right leg  SURGEON:  Surgeon(s) and Role:    Loreli SlotHendrickson, Amonie Wisser C, MD - Primary  PHYSICIAN ASSISTANT: Doree Fudgeonielle Zimmerman PA-C  Lowella DandyErin Barrett, PA-C  ASSISTANTS: Virgilio FreesKim Hardy RNFA  ANESTHESIA:   general  EBL:  1005 mL   BLOOD ADMINISTERED:- CC CELLSAVER  DRAINS: Chest tubes placed in the mediastinal and pleural spaces   COUNTS CORRECT:  YES  DICTATION: .Dragon Dictation  PLAN OF CARE: Admit to inpatient   PATIENT DISPOSITION:  ICU - intubated and hemodynamically stable.   Delay start of Pharmacological VTE agent (>24hrs) due to surgical blood loss or risk of bleeding: yes  BASELINE WEIGHT: 83 kg  XC= 98 min CPB= 139 min

## 2017-10-01 NOTE — Procedures (Signed)
Extubation Procedure Note Pt completed Rapid Wean without complication. Pt follows commands. ABG below acceptable range, Dr. Dorris FetchHendrickson ok'd to pull ETT. NIF -40, VC 1.1L, Cuff leak +, No STRIDOR post extubation to 2L [place].   Patient Details:   Name: Louis Byrd DOB: 11/26/1975 MRN: [id number]   Airway Documentation:     Evaluation  O2 sats: stable throughout Complications: No apparent complications Patient did tolerate procedure well. Bilateral Breath Sounds: Diminished   Yes  Lianne BushyDaniel, Dammon Makarewicz C 10/01/2017, 7:45 PM

## 2017-10-01 NOTE — Progress Notes (Signed)
  Echocardiogram Echocardiogram Transesophageal has been performed.  Hanson Medeiros L Androw 10/01/2017, 8:43 AM

## 2017-10-01 NOTE — Transfer of Care (Signed)
Immediate Anesthesia Transfer of Care Note  Patient: Louis Byrd  Procedure(s) Performed: CORONARY ARTERY BYPASS GRAFTING (CABG) x5 using the left internal mammary artery, the right greater saphenous vein harvested endoscopically, and the left radial artery. (N/A Chest) TRANSESOPHAGEAL ECHOCARDIOGRAM (TEE) (N/A ) RADIAL ARTERY HARVEST (Left )  Patient Location: SICU  Anesthesia Type:General  Level of Consciousness: Patient remains intubated per anesthesia plan  Airway & Oxygen Therapy: Patient remains intubated per anesthesia plan and Patient placed on Ventilator (see vital sign flow sheet for setting)  Post-op Assessment: Report given to RN and Post -op Vital signs reviewed and stable  Post vital signs: Reviewed and stable  Last Vitals:  Vitals:   10/01/17 0615  BP: 126/89  Pulse: 68  Resp: 20  Temp: 36.7 C  SpO2: 97%    Last Pain:  Vitals:   10/01/17 0630  TempSrc:   PainSc: 0-No pain         Complications: No apparent anesthesia complications

## 2017-10-01 NOTE — Progress Notes (Signed)
Dr. Dorris FetchHendrickson at bedside for evening rounds, shown ABG, told to extubate.

## 2017-10-01 NOTE — Anesthesia Preprocedure Evaluation (Signed)
Anesthesia Evaluation  Patient identified by MRN, date of birth, ID band Patient awake    Reviewed: Allergy & Precautions, NPO status , Patient's Chart, lab work & pertinent test results  Airway Mallampati: I  TM Distance: >3 FB Neck ROM: Full    Dental   Pulmonary    Pulmonary exam normal        Cardiovascular + CAD  Normal cardiovascular exam     Neuro/Psych    GI/Hepatic   Endo/Other  diabetes, Type 2, Oral Hypoglycemic Agents  Renal/GU      Musculoskeletal   Abdominal   Peds  Hematology   Anesthesia Other Findings   Reproductive/Obstetrics                             Anesthesia Physical Anesthesia Plan  ASA: III  Anesthesia Plan: General   Post-op Pain Management:    Induction: Intravenous  PONV Risk Score and Plan: 2  Airway Management Planned: Oral ETT  Additional Equipment: Arterial line, CVP, PA Cath, TEE and Ultrasound Guidance Line Placement  Intra-op Plan:   Post-operative Plan: Post-operative intubation/ventilation  Informed Consent: I have reviewed the patients History and Physical, chart, labs and discussed the procedure including the risks, benefits and alternatives for the proposed anesthesia with the patient or authorized representative who has indicated his/her understanding and acceptance.     Plan Discussed with: CRNA and Surgeon  Anesthesia Plan Comments:         Anesthesia Quick Evaluation

## 2017-10-01 NOTE — Op Note (Signed)
NAMEVERNON, MAISH NO.:  [id number]  MEDICAL RECORD NO.:  [id number]  LOCATION:                                 FACILITY:  PHYSICIAN:  Salvatore Decent. Dorris Fetch, M.D. DATE OF BIRTH:  DATE OF PROCEDURE:  10/01/2017 DATE OF DISCHARGE:                              OPERATIVE REPORT   PREOPERATIVE DIAGNOSIS:  Three-vessel coronary artery disease.  POSTOPERATIVE DIAGNOSIS:  Three-vessel coronary artery disease.  PROCEDURES:  Median sternotomy, extracorporeal circulation, Coronary artery bypass grafting x 5   Left internal mammary artery to left anterior descending,  Left radial artery to obtuse marginal 2  Saphenous vein graft to first diagonal,  Sequential saphenous vein graft to posterior descending and     posterolateral Endoscopic vein harvest right leg.  SURGEON:  Salvatore Decent. Dorris Fetch, MD.  ASSISTANTS: 1. Doree Fudge, PA-C. 2. Erin Barrett, PA-C.  ANESTHESIA:  General.  FINDINGS:  Coronaries diffusely diseased, fair quality targets. Conduits good quality.  CLINICAL NOTE:  Mr. Falotico is a 42 year old man with a longstanding history of type 1 diabetes as well as multiple other cardiac risk factors.  He presented with 6 months of fatigue and new onset angina.  He ruled out for myocardial infarction, but a stress test showed reversible ischemia and ejection fraction of 42%.  He was sent to [facility] for catheterization, which revealed severe 3-vessel coronary artery disease with a totally occluded LAD.  He was referred for coronary artery bypass grafting.  The indications, risks, benefits, and alternatives were discussed in detail with the patient.  He understood and accepted the risks and agreed to proceed.  We did discuss the use of a left radial artery.  He had a normal Tavoris's test.  He consented to the use of that vessel as a graft.  OPERATIVE NOTE:  Mr. Borton was brought to the preoperative holding area on October 01, 2017.  Anesthesia  Service placed a Swan-Ganz catheter and an arterial blood pressure monitoring line.  He was taken to the operating room, anesthetized, and intubated.  A Foley catheter was placed.  Intravenous antibiotics were administered.  The chest, abdomen, legs, and left arm were prepped and draped in usual sterile fashion. The conduits were harvested simultaneously.  An incision was made over the volar aspect of the left wrist.  A short incision was made distally. Initially, a small segment of the radial artery was dissected out with proximal occlusion.  There was a good pulse distally and there was a good Doppler signal in the palmar arch.  The incision was extended to just below the antecubital fossa and the radial artery was harvested using the Harmonic scalpel.  Also during this time, an incision was made in the medial aspect of the right leg at the level of the knee.  The greater saphenous vein was harvested from the right thigh endoscopically.  A median sternotomy was performed and the left internal mammary artery was harvested using standard technique.  2000 units of heparin was administered during the vessel harvest.  The remainder of full heparin dose was given prior to opening the pericardium.  After harvesting the conduits, the leg and left arm incisions  were closed in the standard fashion.  The left arm was wrapped and placed to the patient's side.  A sternal retractor was placed.  The pericardium was opened. After confirming adequate anticoagulation with ACT measurement, the aorta was cannulated via concentric 2-0 Ethibond pledgeted pursestring sutures.  A dual-stage venous cannula was placed via a pursestring suture in the right atrial appendage.  Cardiopulmonary bypass was initiated.  Flows were maintained per protocol.  The patient was cooled to 32 degrees Celsius.  The coronary arteries were inspected and anastomotic sites were chosen.  The conduits were inspected and cut to  length.  A foam pad was placed in the pericardium to insulate the heart.  A temperature probe was placed in myocardial septum and a cardioplegia cannula was placed in the ascending aorta.  The aorta was crossclamped.  The left ventricle was emptied via the aortic root vent.  Cardiac arrest then was achieved with a combination of cold antegrade blood cardioplegia and topical iced saline.  1 L of cardioplegia was administered.  There was rapid diastolic arrest.  There was septal cooling to 12 degrees Celsius.  A reversed saphenous vein graft was placed end-to-side to the first diagonal branch to the LAD.  This vessel was fair quality. It accepted a 1- mm probe distally.  There was a relative mismatch given the large size of the vein relative to the artery.  An end-to-side anastomosis was performed with a running 7-0 Prolene suture.  All anastomoses were probed proximally and distally at their completion to ensure patency before tying the suture.  Cardioplegia was administered down the vein graft.  Flow and hemostasis were good.  A reversed saphenous vein graft then was placed sequentially to the posterior descending and posterolateral branches of the right coronary artery.  These were both 1.5-mm vessels.  They were good quality at the site of anastomosis, but did have diffuse disease.  A side-to-side anastomosis was performed to the posterior descending and end-to-side to the posterolateral.  Both were done with running 7-0 Prolene sutures. Both probed easily proximally and distally.  With cardioplegia administration, there was good flow and good hemostasis at both anastomoses.  Additional cardioplegia was also administered down the aortic root.  The heart was elevated exposing the lateral wall.  OM-2 was identified. An arteriotomy was made.  This was a 1.5-mm vessel.  It was fair quality.  There was diffuse plaque.  The distal end of the radial artery was beveled and it was  anastomosed end-to-side with a running 8-0 Prolene suture.  At completion of anastomosis, the probe passed easily. Cardioplegia was administered down the vein grafts and aortic root, and there was good backbleeding from the radial artery.  The left internal mammary artery was brought through a window in the pericardium.  The distal end was beveled.  It was anastomosed end-to- side to the distal LAD.  The LAD was totally occluded more proximally. Distally, it accepted a 1.5-mm probe, but did have diffuse plaque.  The mammary was a good quality vessel.  An end-to-side anastomosis was performed with a running 8-0 Prolene suture.  At the completion of the anastomosis, the bulldog clamp was briefly removed.  There was good hemostasis and rapid septal rewarming, and the bulldog clamp was replaced.  The mammary pedicle was tacked to the epicardial surface of the heart with 6-0 Prolene sutures.  Additional cardioplegia was administered.  The vein grafts and radial artery were cut to length.  The radial was relatively large in  caliber. It was felt large enough to anastomose directly to the aorta.  The cardioplegia cannula was removed from the ascending aorta.  The proximal anastomoses were performed to 4.5 mm-punch aortotomy with running 6-0 Prolene sutures.  At the completion of the final proximal anastomosis, the patient was placed in Trendelenburg position.  Lidocaine was administered.  The aortic root was de-aired and the aortic crossclamp was removed.  The total crossclamp time was 98 minutes.  While rewarming was completed, all proximal and distal anastomoses were inspected for hemostasis, and epicardial pacing wires were placed on the right ventricle and right atrium.  The patient initially fibrillated, but spontaneously converted to sinus bradycardia during inspection of the distal anastomoses.  Epicardial pacing wires were placed on the right ventricle and right atrium and atrial pacing  was initiated at 90 beats per minute.  When the patient had rewarmed to a core temperature of 37 degrees Celsius, he was weaned from cardiopulmonary bypass on the first attempt.  Total bypass time was 139 minutes.  Post-bypass transesophageal echocardiography was unchanged from the prebypass study with no significant valvular pathology and overall well-preserved left ventricular function.  The initial cardiac index was greater than 3 L/min/m2.  A test dose of protamine was administered and it was well tolerated. The atrial and aortic cannulae were removed.  The remainder of the protamine was administered without incident.  The chest was irrigated with warm saline.  Hemostasis was achieved.  Left pleural and mediastinal chest tubes were placed through separate subcostal incisions.  The pericardium was reapproximated with interrupted 3-0 silk sutures.  It came together easily without kinking the grafts or causing tension on the heart.  The sternum was closed with a combination of single and double heavy gauge stainless steel wires.  The pectoralis fascia, subcutaneous tissue, and skin were closed in standard fashion. All sponge, needle, and instrument counts were correct at the end of the procedure.  The patient was taken from the operating room to the Surgical Intensive Care Unit intubated and in good condition.     Salvatore Decent Dorris Fetch, M.D.     SCH/MEDQ  D:  10/01/2017  T:  10/01/2017  Job:  [id number]

## 2017-10-01 NOTE — Anesthesia Procedure Notes (Signed)
Central Venous Catheter Insertion Performed by: Gaynelle AduFitzgerald, Sonoma Firkus, MD, anesthesiologist Start/End2/18/2019 6:45 AM, 10/01/2017 7:00 AM Patient location: Pre-op. Preanesthetic checklist: patient identified, IV checked, site marked, risks and benefits discussed, surgical consent, monitors and equipment checked, pre-op evaluation, timeout performed and anesthesia consent Hand hygiene performed  and maximum sterile barriers used  PA cath was placed.Swan type:thermodilution PA Cath depth:50 Procedure performed without using ultrasound guided technique. Attempts: 1 Patient tolerated the procedure well with no immediate complications.

## 2017-10-01 NOTE — Anesthesia Procedure Notes (Signed)
Arterial Line Insertion Start/End2/18/2019 6:50 AM, 10/01/2017 6:53 AM Performed by: Epifanio LeschesMercer, Amarah Brossman L, CRNA, CRNA  Patient location: Pre-op. Preanesthetic checklist: patient identified, IV checked, site marked, risks and benefits discussed, surgical consent, monitors and equipment checked, pre-op evaluation, timeout performed and anesthesia consent Lidocaine 1% used for infiltration Right, radial was placed Catheter size: 20 G Hand hygiene performed  and maximum sterile barriers used  Claiborne's test indicative of satisfactory collateral circulation Attempts: 1 Procedure performed without using ultrasound guided technique. Ultrasound Notes:anatomy identified Following insertion, dressing applied and Biopatch. Post procedure assessment: normal  Patient tolerated the procedure well with no immediate complications.

## 2017-10-02 ENCOUNTER — Encounter (HOSPITAL_COMMUNITY): Payer: Self-pay | Admitting: Thoracic Surgery (Cardiothoracic Vascular Surgery)

## 2017-10-02 ENCOUNTER — Inpatient Hospital Stay (HOSPITAL_COMMUNITY): Payer: BLUE CROSS/BLUE SHIELD

## 2017-10-02 LAB — GLUCOSE, CAPILLARY
GLUCOSE-CAPILLARY: 95 mg/dL (ref 65–99)
GLUCOSE-CAPILLARY: 103 mg/dL — AB (ref 65–99)
GLUCOSE-CAPILLARY: 98 mg/dL (ref 65–99)
GLUCOSE-CAPILLARY: 116 mg/dL — AB (ref 65–99)
GLUCOSE-CAPILLARY: 155 mg/dL — AB (ref 65–99)
GLUCOSE-CAPILLARY: 184 mg/dL — AB (ref 65–99)
GLUCOSE-CAPILLARY: 194 mg/dL — AB (ref 65–99)
Glucose-Capillary: 109 mg/dL — ABNORMAL HIGH (ref 65–99)
Glucose-Capillary: 109 mg/dL — ABNORMAL HIGH (ref 65–99)
Glucose-Capillary: 109 mg/dL — ABNORMAL HIGH (ref 65–99)
Glucose-Capillary: 114 mg/dL — ABNORMAL HIGH (ref 65–99)
Glucose-Capillary: 116 mg/dL — ABNORMAL HIGH (ref 65–99)
Glucose-Capillary: 119 mg/dL — ABNORMAL HIGH (ref 65–99)
Glucose-Capillary: 120 mg/dL — ABNORMAL HIGH (ref 65–99)
Glucose-Capillary: 198 mg/dL — ABNORMAL HIGH (ref 65–99)
Glucose-Capillary: 218 mg/dL — ABNORMAL HIGH (ref 65–99)
Glucose-Capillary: 99 mg/dL (ref 65–99)

## 2017-10-02 LAB — POCT I-STAT, CHEM 8
BUN: 13 mg/dL (ref 6–20)
CREATININE: 0.8 mg/dL (ref 0.61–1.24)
Calcium, Ion: 1.22 mmol/L (ref 1.15–1.40)
Chloride: 100 mmol/L — ABNORMAL LOW (ref 101–111)
GLUCOSE: 207 mg/dL — AB (ref 65–99)
HCT: 34 % — ABNORMAL LOW (ref 39.0–52.0)
HEMOGLOBIN: 11.6 g/dL — AB (ref 13.0–17.0)
POTASSIUM: 4.3 mmol/L (ref 3.5–5.1)
Sodium: 137 mmol/L (ref 135–145)
TCO2: 25 mmol/L (ref 22–32)

## 2017-10-02 LAB — CBC
HCT: 36.3 % — ABNORMAL LOW (ref 39.0–52.0)
HCT: 36.4 % — ABNORMAL LOW (ref 39.0–52.0)
Hemoglobin: 12.3 g/dL — ABNORMAL LOW (ref 13.0–17.0)
Hemoglobin: 11.9 g/dL — ABNORMAL LOW (ref 13.0–17.0)
MCH: 29.4 pg (ref 26.0–34.0)
MCH: 28.9 pg (ref 26.0–34.0)
MCHC: 33.9 g/dL (ref 30.0–36.0)
MCHC: 32.7 g/dL (ref 30.0–36.0)
MCV: 86.6 fL (ref 78.0–100.0)
MCV: 88.3 fL (ref 78.0–100.0)
PLATELETS: 165 10*3/uL (ref 150–400)
PLATELETS: 189 10*3/uL (ref 150–400)
RBC: 4.19 MIL/uL — ABNORMAL LOW (ref 4.22–5.81)
RBC: 4.12 MIL/uL — ABNORMAL LOW (ref 4.22–5.81)
RDW: 13.4 % (ref 11.5–15.5)
RDW: 13.6 % (ref 11.5–15.5)
WBC: 11.8 10*3/uL — ABNORMAL HIGH (ref 4.0–10.5)
WBC: 13.4 10*3/uL — AB (ref 4.0–10.5)

## 2017-10-02 LAB — BASIC METABOLIC PANEL
Anion gap: 9 (ref 5–15)
BUN: 6 mg/dL (ref 6–20)
CALCIUM: 8.2 mg/dL — AB (ref 8.9–10.3)
CO2: 22 mmol/L (ref 22–32)
Chloride: 110 mmol/L (ref 101–111)
Creatinine, Ser: 0.75 mg/dL (ref 0.61–1.24)
GLUCOSE: 111 mg/dL — AB (ref 65–99)
Potassium: 4.2 mmol/L (ref 3.5–5.1)
Sodium: 141 mmol/L (ref 135–145)

## 2017-10-02 LAB — CREATININE, SERUM
Creatinine, Ser: 0.95 mg/dL (ref 0.61–1.24)
GFR calc non Af Amer: >60 mL/min (ref >60)

## 2017-10-02 LAB — MAGNESIUM
Magnesium: 2.4 mg/dL (ref 1.7–2.4)
Magnesium: 2.2 mg/dL (ref 1.7–2.4)

## 2017-10-02 MED ORDER — INSULIN ASPART 100 UNIT/ML ~~LOC~~ SOLN
0.0000 [IU] | Freq: Three times a day (TID) | SUBCUTANEOUS | Status: DC
  Administered 2017-10-02 (×2): 2 [IU] via SUBCUTANEOUS

## 2017-10-02 MED ORDER — INSULIN DETEMIR 100 UNIT/ML ~~LOC~~ SOLN
20.0000 [IU] | Freq: Two times a day (BID) | SUBCUTANEOUS | Status: DC
  Administered 2017-10-02 – 2017-10-05 (×7): 20 [IU] via SUBCUTANEOUS
  Filled 2017-10-02 (×7): qty 0.2

## 2017-10-02 MED ORDER — ENOXAPARIN SODIUM 40 MG/0.4ML ~~LOC~~ SOLN
40.0000 mg | Freq: Every day | SUBCUTANEOUS | Status: DC
  Administered 2017-10-02 – 2017-10-04 (×3): 40 mg via SUBCUTANEOUS
  Filled 2017-10-02 (×3): qty 0.4

## 2017-10-02 MED ORDER — INSULIN ASPART 100 UNIT/ML ~~LOC~~ SOLN
4.0000 [IU] | Freq: Three times a day (TID) | SUBCUTANEOUS | Status: DC
Start: 2017-10-03 — End: 2017-10-03

## 2017-10-02 MED FILL — Heparin Sodium (Porcine) Inj 1000 Unit/ML: INTRAMUSCULAR | Qty: 30 | Status: AC

## 2017-10-02 MED FILL — Magnesium Sulfate Inj 50%: INTRAMUSCULAR | Qty: 10 | Status: AC

## 2017-10-02 MED FILL — Potassium Chloride Inj 2 mEq/ML: INTRAVENOUS | Qty: 40 | Status: AC

## 2017-10-02 NOTE — Care Management Note (Signed)
Case Management Note Donn PieriniKristi Jernie Schutt RN, BSN Unit 4E-Case Manager-- 2H coverage [phone]  Patient Details  Name: Louis Byrd MRN: [id number] Date of Birth: 10/04/1975  Subjective/Objective:  Pt admitted s/p CABGx5                   Action/Plan: PTA pt lived at home with wife- CM to follow for transition of care needs  Expected Discharge Date:                  Expected Discharge Plan:  Home/Self Care  In-House Referral:     Discharge planning Services  CM Consult  Post Acute Care Choice:    Choice offered to:     DME Arranged:    DME Agency:     HH Arranged:    HH Agency:     Status of Service:  In process, will continue to follow  If discussed at Long Length of Stay Meetings, dates discussed:    Discharge Disposition:   Additional Comments:  Darrold SpanWebster, Tyteanna Ost Hall, RN 10/02/2017, 1:15 PM

## 2017-10-02 NOTE — Progress Notes (Signed)
Patient examined and record reviewed.Hemodynamics stable,labs satisfactory.Patient had stable day.Continue current care. Kathlee Nationseter Van Trigt III 10/02/2017

## 2017-10-02 NOTE — Progress Notes (Signed)
1 Day Post-Op Procedure(s) (LRB): CORONARY ARTERY BYPASS GRAFTING (CABG) x5 using the left internal mammary artery, the right greater saphenous vein harvested endoscopically, and the left radial artery. (N/A) TRANSESOPHAGEAL ECHOCARDIOGRAM (TEE) (N/A) RADIAL ARTERY HARVEST (Left) Subjective: Some incisional pain  Objective: Vital signs in last 24 hours: Temp:  [96.8 F (36 C)-99.3 F (37.4 C)] 99.1 F (37.3 C) (02/19 0745) Pulse Rate:  [51-104] 60 (02/19 0745) Cardiac Rhythm: Atrial paced (02/19 0400) Resp:  [9-28] 19 (02/19 0745) BP: (84-120)/(53-76) 98/72 (02/19 0700) SpO2:  [93 %-100 %] 94 % (02/19 0745) Arterial Line BP: (90-132)/(44-71) 114/53 (02/19 0745) FiO2 (%):  [40 %-50 %] 40 % (02/18 1856) Weight:  [194 lb 7.1 oz (88.2 kg)] 194 lb 7.1 oz (88.2 kg) (02/19 0500)  Hemodynamic parameters for last 24 hours: PAP: (15-39)/(6-24) 29/14 CO:  [3.6 L/min-6.5 L/min] 6.5 L/min CI:  [1.8 L/min/m2-3.3 L/min/m2] 3.3 L/min/m2  Intake/Output from previous day: 02/18 0701 - 02/19 0700 In: 5707.9 [P.O.:260; I.V.:3027.9; Blood:520; IV Piggyback:1900] Out: 5340 [Urine:4035; Blood:1005; Chest Tube:300] Intake/Output this shift: Total I/O In: 3.7 [I.V.:3.7] Out: -   General appearance: alert, cooperative and no distress Neurologic: intact Heart: slightly irregular Lungs: diminished breath sounds bibasilar Extremities: well perfused  Lab Results: Recent Labs    10/01/17 1920 10/01/17 1935 10/02/17 0351  WBC 14.5*  --  11.8*  HGB 12.7* 11.6* 12.3*  HCT 37.2* 34.0* 36.3*  PLT 174  --  165   BMET:  Recent Labs    10/01/17 0624  10/01/17 1935 10/02/17 0351  NA 139   < > 141 141  K 4.5   < > 5.0 4.2  CL 103   < > 108 110  CO2 26  --   --  22  GLUCOSE 121*   < > 156* 111*  BUN 8   < > 7 6  CREATININE 0.85   < > 0.60* 0.75  CALCIUM 9.2  --   --  8.2*   < > = values in this interval not displayed.    PT/INR:  Recent Labs    10/01/17 1353  LABPROT 18.4*  INR 1.54    ABG    Component Value Date/Time   PHART 7.271 (L) 10/01/2017 2036   HCO3 21.4 10/01/2017 2036   TCO2 23 10/01/2017 2036   ACIDBASEDEF 6.0 (H) 10/01/2017 2036   O2SAT 99.0 10/01/2017 2036   CBG (last 3)  Recent Labs    10/02/17 0501 10/02/17 0602 10/02/17 0653  GLUCAP 99 116* 119*    Assessment/Plan: S/P Procedure(s) (LRB): CORONARY ARTERY BYPASS GRAFTING (CABG) x5 using the left internal mammary artery, the right greater saphenous vein harvested endoscopically, and the left radial artery. (N/A) TRANSESOPHAGEAL ECHOCARDIOGRAM (TEE) (N/A) RADIAL ARTERY HARVEST (Left) POD # 1  CV- in SR with PACs- give lopressor now  Imdur for radial graft, dc NTG drip at noon  Good hemodynamics- dc swan and A line  RESP- IS for atelectasis  RENAL- creatinine and lytes OK  ENDO - CBG well controlled- transition to levemir + SSI  DC chest tubes  Cardiac rehab   LOS: 1 day    Loreli SlotSteven C Eusebio Blazejewski 10/02/2017

## 2017-10-03 ENCOUNTER — Inpatient Hospital Stay (HOSPITAL_COMMUNITY): Payer: BLUE CROSS/BLUE SHIELD

## 2017-10-03 LAB — BASIC METABOLIC PANEL
Anion gap: 6 (ref 5–15)
BUN: 10 mg/dL (ref 6–20)
CO2: 27 mmol/L (ref 22–32)
Calcium: 8.3 mg/dL — ABNORMAL LOW (ref 8.9–10.3)
Chloride: 103 mmol/L (ref 101–111)
Creatinine, Ser: 0.89 mg/dL (ref 0.61–1.24)
GFR calc Af Amer: >60 mL/min (ref >60)
Glucose, Bld: 205 mg/dL — ABNORMAL HIGH (ref 65–99)
POTASSIUM: 4.5 mmol/L (ref 3.5–5.1)
SODIUM: 136 mmol/L (ref 135–145)

## 2017-10-03 LAB — BPAM RBC
Blood Product Expiration Date: 201903112359
Blood Product Expiration Date: 201903112359
ISSUE DATE / TIME: 201902102314
UNIT TYPE AND RH: 5100
Unit Type and Rh: 5100

## 2017-10-03 LAB — TYPE AND SCREEN
ABO/RH(D): O POS
Antibody Screen: NEGATIVE
UNIT DIVISION: 0
UNIT DIVISION: 0

## 2017-10-03 LAB — CBC
HEMATOCRIT: 34.3 % — AB (ref 39.0–52.0)
Hemoglobin: 11.3 g/dL — ABNORMAL LOW (ref 13.0–17.0)
MCH: 29.4 pg (ref 26.0–34.0)
MCHC: 32.9 g/dL (ref 30.0–36.0)
MCV: 89.3 fL (ref 78.0–100.0)
Platelets: 162 10*3/uL (ref 150–400)
RBC: 3.84 MIL/uL — ABNORMAL LOW (ref 4.22–5.81)
RDW: 13.7 % (ref 11.5–15.5)
WBC: 12.3 10*3/uL — AB (ref 4.0–10.5)

## 2017-10-03 LAB — GLUCOSE, CAPILLARY
GLUCOSE-CAPILLARY: 139 mg/dL — AB (ref 65–99)
GLUCOSE-CAPILLARY: 187 mg/dL — AB (ref 65–99)
Glucose-Capillary: 149 mg/dL — ABNORMAL HIGH (ref 65–99)
Glucose-Capillary: 140 mg/dL — ABNORMAL HIGH (ref 65–99)
Glucose-Capillary: 106 mg/dL — ABNORMAL HIGH (ref 65–99)
Glucose-Capillary: 98 mg/dL (ref 65–99)

## 2017-10-03 MED ORDER — SODIUM CHLORIDE 0.9% FLUSH: 3.0000 mL | INTRAVENOUS | Status: DC | PRN

## 2017-10-03 MED ORDER — SODIUM CHLORIDE 0.9 % IV SOLN: 250.0000 mL | INTRAVENOUS | Status: DC | PRN

## 2017-10-03 MED ORDER — MOVING RIGHT ALONG BOOK
Freq: Once | Status: DC
  Filled 2017-10-03: qty 1

## 2017-10-03 MED ORDER — SODIUM CHLORIDE 0.9% FLUSH
3.0000 mL | Freq: Two times a day (BID) | INTRAVENOUS | Status: DC
  Administered 2017-10-03 – 2017-10-05 (×3): 3 mL via INTRAVENOUS

## 2017-10-03 MED ORDER — INSULIN ASPART 100 UNIT/ML ~~LOC~~ SOLN
0.0000 [IU] | Freq: Three times a day (TID) | SUBCUTANEOUS | Status: DC
  Administered 2017-10-04 (×2): 3 [IU] via SUBCUTANEOUS
  Administered 2017-10-05: 5 [IU] via SUBCUTANEOUS

## 2017-10-03 MED ORDER — MAGNESIUM HYDROXIDE 400 MG/5ML PO SUSP: 30.0000 mL | Freq: Every day | ORAL | Status: DC | PRN

## 2017-10-03 MED ORDER — INSULIN ASPART 100 UNIT/ML ~~LOC~~ SOLN
8.0000 [IU] | Freq: Three times a day (TID) | SUBCUTANEOUS | Status: DC
  Administered 2017-10-03 – 2017-10-05 (×5): 8 [IU] via SUBCUTANEOUS

## 2017-10-03 MED ORDER — ZOLPIDEM TARTRATE 5 MG PO TABS
5.0000 mg | ORAL_TABLET | Freq: Every evening | ORAL | Status: DC | PRN
Start: 2017-10-03 — End: 2017-10-05
  Administered 2017-10-05: 5 mg via ORAL
  Filled 2017-10-03: qty 1

## 2017-10-03 MED ORDER — METOPROLOL TARTRATE 25 MG PO TABS
25.0000 mg | ORAL_TABLET | Freq: Two times a day (BID) | ORAL | Status: DC
  Administered 2017-10-03 (×2): 25 mg via ORAL
  Filled 2017-10-03 (×2): qty 1

## 2017-10-03 MED ORDER — ISOSORBIDE MONONITRATE ER 30 MG PO TB24
15.0000 mg | ORAL_TABLET | Freq: Every day | ORAL | Status: DC
  Administered 2017-10-04 – 2017-10-05 (×2): 15 mg via ORAL
  Filled 2017-10-03 (×2): qty 1

## 2017-10-03 MED ORDER — ALUM & MAG HYDROXIDE-SIMETH 200-200-20 MG/5ML PO SUSP: 15.0000 mL | Freq: Four times a day (QID) | ORAL | Status: DC | PRN

## 2017-10-03 MED FILL — Lidocaine HCl IV Inj 20 MG/ML: INTRAVENOUS | Qty: 5 | Status: AC

## 2017-10-03 MED FILL — Mannitol IV Soln 20%: INTRAVENOUS | Qty: 500 | Status: AC

## 2017-10-03 MED FILL — Sodium Bicarbonate IV Soln 8.4%: INTRAVENOUS | Qty: 50 | Status: AC

## 2017-10-03 MED FILL — Heparin Sodium (Porcine) Inj 1000 Unit/ML: INTRAMUSCULAR | Qty: 10 | Status: AC

## 2017-10-03 MED FILL — Electrolyte-R (PH 7.4) Solution: INTRAVENOUS | Qty: 4000 | Status: AC

## 2017-10-03 MED FILL — Sodium Chloride IV Soln 0.9%: INTRAVENOUS | Qty: 2000 | Status: AC

## 2017-10-03 NOTE — Progress Notes (Addendum)
0700 Bedside shift report, pt sitting up in chair, wife at bedside. Pt with c/o pain in leg and chest, medicated at 0545 for pain, pt done walking hallways. [facility]   0800 Pt assessed, see flow sheet. Pt A&Ox4, no complaints at this time. Updated with POC. WCTM.  0945 Foley removed, introducer to right IJ removed, and wound care performed to left forearm and right calf. Pt tolerated well.  1200 Report called to Victorino DikeJennifer, RN on 4E. Pt updated of his new room number, 4E10, awaiting lunch tray and will transfer pt.   1230 Pt transferred via bed. Wife at bedside gathered all personal belongings. Pt assisted to new bed in room 4E10, wife at bedside.

## 2017-10-03 NOTE — Progress Notes (Signed)
[facility] to 4E room 10 after CABG x5 on 10/01/17.  Telemetry monitor applied and CCMD notified.  Patient oriented to unit and room to include call light and phone. Will continue to monitor.

## 2017-10-03 NOTE — Discharge Summary (Signed)
Physician Discharge Summary       [address] [place]       [name] [address]             [phone]    Patient ID: Louis Byrd MRN: [id number] DOB/AGE: 41-Dec-1977 41 y.o.  Admit date: 10/01/2017 Discharge date: 10/05/2017  Admission Diagnosis: Coronary artery disease  Discharge Diagnoses:  1. S/p CABG x 5 2. ABL anemia   3. History of pulmonary nodule-[facility]: "pulm nodule posterior lung base on lateral film, rec noncontrast chest CT for further eval. 4. History of hyperlipidemia 5. History of type I diabetes mellitus (HCC) 6. History of seizures (HCC) 7. History of hypothyroidism 8. History of bursitis of right hip 9. History of arthritis  Procedure (s):  Median sternotomy, extracorporeal circulation, coronary artery bypass grafting x5 (left internal mammary artery to left anterior descending, left radial artery to obtuse marginal 2, saphenous vein graft to first diagonal, sequential saphenous vein graft to posterior descending and posterolateral), endoscopic vein harvest of right leg by Dr. Dorris Fetch on 10/01/2017.   History of Presenting Illness: Louis Byrd is a 42 yo man with a past history of type 1 DM x 31 years, palpitations, hyperlipidemia, hypothyroidism, arthritis and seizures. He has a strong family history of CAD but no known prior heart problems. 6 month history of fatigue. Over past week he started having flushing with exertion. Then he began to have chest tightness and shortness of breath. He was seen in the hospital at [facility] and ruled out for MI. He had a stress test which showed reversible apical and apical-septal ischemia and an EF of 42%. He was sent to [facility] for cath,  Catheterization was performed today. 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 EF appeared normal. He is currently pain free.  Possible left lower lobe lung nodule on CXR.  This is a 42 yo man with multiple CRF including hyperlipidemia, type 1  DM, tobacco use and a strong family history presents with new onset exertional angina. He has been found to have severe 3 vessel CAD by cath. CABG indicated for survival benefit and relief of symptoms.  Dr. Dorris Fetch discussed the general nature of the procedure, the need for general anesthesia, the  incisions to be used, and the use of cardiopulmonary bypass with Louis Byrd and his family. They discussed the expected hospital stay, overall recovery and short and long term outcomes. Dr. Dorris Fetch informed them of the indications, risks, benefits and alternatives. Patient agreed to proceed with surgery. Pre operative carotid duplex US showed no significant internal carotida artery stenosis bilaterally. He underwent a CABG x 4 on 10/01/2017.    Brief Hospital Course:  The patient was extubated the evening of surgery without difficulty. He remained afebrile and hemodynamically stable. He was on a Nitro drip for radial artery harvest. Theone Murdoch, a line, chest tubes, and foley were removed early in the post operative course. He had PACs but remained in SR. Lopressor was started and titrated accordingly. He was started on Imdur for radial artery graft and Nitro drip was weaned off. He was volume over loaded and diuresed. He had ABL anemia. He did not require a post op transfusion. Last H and H was stable at 11.8 and 35.2. He was weaned off the insulin drip.   The patient's glucose remained well controlled on Insulin.The patient's HGA1C pre op was 6.6. The patient was felt surgically stable for transfer from the ICU to PCTU for  further convalescence on 10/03/2017. He continues to progress with cardiac rehab. He was ambulating on room air. He was tachycardic so Lopressor was increased to 37.5 mg bid and his HR was better controlled. He has been tolerating a diet and has had a bowel movement. Epicardial pacing wires were removed on 10/04/2017. Chest tube sutures will be removed the day of discharge. The patient is  felt surgically stable for discharge today.    Latest Vital Signs: Blood pressure 133/88, pulse 78, temperature 98.3 F (36.8 C), temperature source Oral, resp. rate 19, height 5\' 9"  (1.753 m), weight 192 lb (87.1 kg), SpO2 93 %.  Physical Exam: Cardiovascular: RRR Pulmonary: Slightly diminished at bases Abdomen: Soft, non tender, bowel sounds present. Extremities: Mild bilateral lower extremity edema. Motor/sensory LUE intact Wounds: Clean and dry    Discharge Condition:Stable and discharged to home.  Recent laboratory studies:  Lab Results  Component Value Date   WBC 10.4 10/04/2017   HGB 11.8 (L) 10/04/2017   HCT 35.2 (L) 10/04/2017   MCV 89.1 10/04/2017   PLT 167 10/04/2017   Lab Results  Component Value Date   NA 137 10/04/2017   K 3.9 10/04/2017   CL 101 10/04/2017   CO2 27 10/04/2017   CREATININE 0.76 10/04/2017   GLUCOSE 106 (H) 10/04/2017    Diagnostic Studies: Dg Chest 2 View  Result Date: 10/04/2017 CLINICAL DATA:  Status post coronary bypass grafting EXAM: CHEST  2 VIEW COMPARISON:  10/03/2017 FINDINGS: Cardiac shadow is stable. Postsurgical changes are again seen. Linear density is again noted in the left base stable from the prior study. Slight improved aeration in the medial left lung base is noted. No new focal abnormality is seen. IMPRESSION: Slight improved aeration as described Electronically Signed   By: Alcide Clever M.D.   On: 10/04/2017 07:38    Vas US Doppler Pre Cabg  Result Date: 09/27/2017 PERIOPERATIVE VASCULAR EVALUATION Indications:  Pre operative CABG. Risk Factors: Insulin dependent diabetes mellitus, Type I, coronary artery               disease.  Examination Guidelines: A complete evaluation includes B-mode imaging, spectral doppler, color doppler, and power doppler as needed of all accessible portions of each vessel. Bilateral testing is considered an integral part of a complete examination. Limited examinations for reoccurring indications  may be performed as noted. No piror exam on file for comparison.  Right Carotid Findings: +----------+--------+--------+--------+--------+------------------+           PSV cm/sEDV cm/sStenosisDescribeComments           +----------+--------+--------+--------+--------+------------------+ CCA Prox  103     13                      intimal thickening +----------+--------+--------+--------+--------+------------------+ CCA Distal-119    -24                                        +----------+--------+--------+--------+--------+------------------+ ICA Prox  -71     -23                                        +----------+--------+--------+--------+--------+------------------+ ICA Distal-73     -19                                        +----------+--------+--------+--------+--------+------------------+  ECA       -53     -9                                         +----------+--------+--------+--------+--------+------------------+ Portions of this table do not appear on this page. +----------+--------+-------+--------+------------+           PSV cm/sEDV cmsDescribeArm Pressure +----------+--------+-------+--------+------------+ Subclavian                       122          +----------+--------+-------+--------+------------+ +---------+--------+---+--------+---+ VertebralPSV cm/s-37EDV cm/s-10 +---------+--------+---+--------+---+ Left Carotid Findings: +----------+--------+--------+--------+--------+------------------+           PSV cm/sEDV cm/sStenosisDescribeComments           +----------+--------+--------+--------+--------+------------------+ CCA Prox  98      21                      intimal thickening +----------+--------+--------+--------+--------+------------------+ CCA Distal-96     -21                                        +----------+--------+--------+--------+--------+------------------+ ICA Prox  -99     -20                                         +----------+--------+--------+--------+--------+------------------+ ICA Distal-67     -15                                        +----------+--------+--------+--------+--------+------------------+ ECA       -81     -9                                         +----------+--------+--------+--------+--------+------------------+ +----------+--------+--------+--------+------------+ SubclavianPSV cm/sEDV cm/sDescribeArm Pressure +----------+--------+--------+--------+------------+                                   122          +----------+--------+--------+--------+------------+ +---------+--------+---+--------+---+ VertebralPSV cm/s-46EDV cm/s-13 +---------+--------+---+--------+---+  ABI Findings: +--------+------------------+-----+---------+--------+ Right   Rt Pressure (mmHg)IndexWaveform Comment  +--------+------------------+-----+---------+--------+ [id number]                    triphasic         +--------+------------------+-----+---------+--------+ ATA     165               1.35 triphasic         +--------+------------------+-----+---------+--------+ PTA     172               1.41 triphasic         +--------+------------------+-----+---------+--------+ +--------+------------------+-----+---------+-------+ Left    Lt Pressure (mmHg)IndexWaveform Comment +--------+------------------+-----+---------+-------+ [id number]                    triphasic        +--------+------------------+-----+---------+-------+ ATA     164               1.34  triphasic        +--------+------------------+-----+---------+-------+ PTA     152               1.25 triphasic        +--------+------------------+-----+---------+-------+  Upper Extremity Doppler Findings: +-----------+----------+----------+----------+----------+----------+-----------+ Comments-RTDoppler-RTPressure-R   Site   Pressure-LDoppler-LTComments-LT                       T                   T                               +-----------+----------+----------+----------+----------+----------+-----------+                                Subclavian                                +-----------+----------+----------+----------+----------+----------+-----------+                                 Axillary                                 +-----------+----------+----------+----------+----------+----------+-----------+            triphasic 122        Brachial 122       triphasic             +-----------+----------+----------+----------+----------+----------+-----------+                                 Forearm                                  +-----------+----------+----------+----------+----------+----------+-----------+                                  Radial                                  +-----------+----------+----------+----------+----------+----------+-----------+                                  Ulnar                                   +-----------+----------+----------+----------+----------+----------+-----------+                                  Palmar  Arch                                   +-----------+----------+----------+----------+----------+----------+-----------+                                  Digit                                   +-----------+----------+----------+----------+----------+----------+-----------+  Final Interpretation: Right Carotid: The extracranial vessels were near-normal with only minimal wall                thickening or plaque. Left Carotid: The extracranial vessels were near-normal with only minimal wall               thickening or plaque. Vertebrals:  Both vertebral arteries were patent with antegrade flow. Subclavians: Right ABI: Resting right ankle-brachial index is within normal range. No evidence of  significant right lower extremity arterial disease. Left ABI: Resting left ankle-brachial index is within normal range. No evidence of significant left lower extremity arterial disease.  Electronically signed by Lemar Livings on 09/27/2017 at 2:03:05 PM.   Final   Discharge Instructions    Amb Referral to Cardiac Rehabilitation   Complete by:  As directed    Referring to [place] CRP 2   Diagnosis:  CABG   CABG X ___:  5      Discharge Medications: Allergies as of 10/05/2017   No Known Allergies     Medication List    STOP taking these medications   nitroGLYCERIN 0.4 MG SL tablet Commonly known as:  NITROSTAT     TAKE these medications   aspirin 325 MG EC tablet Take 1 tablet (325 mg total) by mouth daily. What changed:    medication strength  how much to take   atorvastatin 40 MG tablet Commonly known as:  LIPITOR Take 1 tablet (40 mg total) by mouth daily at 6 PM.   furosemide 40 MG tablet Commonly known as:  LASIX Take 1 tablet (40 mg total) by mouth daily. For one week then stop.   insulin aspart 100 UNIT/ML injection Commonly known as:  novoLOG Inject 4-13 Units into the skin 3 (three) times daily before meals. Sliding Scale depending on food that is consumed.   isosorbide mononitrate 30 MG 24 hr tablet Commonly known as:  IMDUR Take 0.5 tablets (15 mg total) by mouth daily. For one month then stop.   levothyroxine 175 MCG tablet Commonly known as:  SYNTHROID, LEVOTHROID Take 175 mcg by mouth daily.   metoprolol tartrate 25 MG tablet Commonly known as:  LOPRESSOR Take 1.5 tablets (37.5 mg total) by mouth 2 (two) times daily. What changed:  how much to take   oxyCODONE 5 MG immediate release tablet Commonly known as:  Oxy IR/ROXICODONE Take 5 mg by mouth every 4-6 hours PRN severe pain   potassium chloride SA 20 MEQ tablet Commonly known as:  K-DUR,KLOR-CON Take 1 tablet (20 mEq total) by mouth daily. For one week then stop   TRESIBA FLEXTOUCH 100  UNIT/ML Sopn FlexTouch Pen Generic drug:  insulin degludec Inject 40 Units into the skin daily.            Durable Medical Equipment  (From admission, onward)  Start     Ordered   10/04/17 1123  For home use only DME Walker rolling  Once    Comments:  Post op CABG  Question:  Patient needs a walker to treat with the following condition  Answer:  Weakness   10/04/17 1122     The patient has been discharged on:   1.Beta Blocker:  Yes [ x  ]                              No   [   ]                              If No, reason:  2.Ace Inhibitor/ARB: Yes [   ]                                     No  [  x  ]                                     If No, reason:Labile BP  3.Statin:   Yes [ x  ]                  No  [   ]                  If No, reason:  4.Ecasa:  Yes  [  x ]                  No   [   ]                  If No, reason:   Follow Up Appointments: Follow-up Information    Loreli SlotHendrickson, Steven C, MD. Go on 11/06/2017.   Specialty:  Cardiothoracic Surgery Why:  PA/LAT CX[facility] which is in the same building as Dr. Sunday CornHendrickson's office) on 11/06/2017 at 12:00 pm ;Appointment time is at 12:30 pm Contact information: [address] [place] [address] [phone]        [facility] - Dme Follow up.   Why:  rolling walker arranged- to be delivered to room prior to discharge Contact information: [address] [place] [address] [phone]        Jodelle GrossLawrence, Kathryn M, NP. Go on 10/18/2017.   Specialties:  Nurse Practitioner, Radiology, Cardiology Why:  Appointment time is at 8:30 am Contact information: [address] [place] [address] [phone]           Signed: Lelon HuhDonielle M [facility] Center[facility]immermanPA-C 10/05/2017, 7:36 AM

## 2017-10-03 NOTE — Progress Notes (Signed)
1435 Came to see pt to walk. Has walked once today. Feeling sleepy and a little nauseated still. Does not feel up to walking at this time. Encouraged two more walks with staff later when feeling better. Will follow up tomorrow. Luetta NuttingCharlene Nashly Olsson RN BSN 10/03/2017 2:37 PM

## 2017-10-03 NOTE — Progress Notes (Signed)
2 Days Post-Op Procedure(s) (LRB): CORONARY ARTERY BYPASS GRAFTING (CABG) x5 using the left internal mammary artery, the right greater saphenous vein harvested endoscopically, and the left radial artery. (N/A) TRANSESOPHAGEAL ECHOCARDIOGRAM (TEE) (N/A) RADIAL ARTERY HARVEST (Left) Subjective: Some incisional pain  Objective: Vital signs in last 24 hours: Temp:  [97.6 F (36.4 C)-99 F (37.2 C)] 97.6 F (36.4 C) (02/20 0815) Pulse Rate:  [54-87] 82 (02/20 0714) Cardiac Rhythm: Normal sinus rhythm (02/20 0400) Resp:  [8-25] 16 (02/20 0714) BP: (97-130)/(62-76) 113/75 (02/20 0600) SpO2:  [87 %-97 %] 87 % (02/20 0714) Arterial Line BP: (122-129)/(55-64) 126/64 (02/19 1100) Weight:  [198 lb (89.8 kg)] 198 lb (89.8 kg) (02/20 0500)  Hemodynamic parameters for last 24 hours: PAP: (31)/(15) 31/15  Intake/Output from previous day: 02/19 0701 - 02/20 0700 In: 816.9 [P.O.:120; I.V.:396.9; IV Piggyback:300] Out: 1130 [Urine:1130] Intake/Output this shift: No intake/output data recorded.  General appearance: alert, cooperative and no distress Neurologic: intact Heart: regular rate and rhythm Lungs: diminished breath sounds bibasilar Abdomen: normal findings: soft, non-tender  Lab Results: Recent Labs    10/02/17 1700 10/03/17 0405  WBC 13.4* 12.3*  HGB 11.9* 11.3*  HCT 36.4* 34.3*  PLT 189 162   BMET:  Recent Labs    10/02/17 0351 10/02/17 1645 10/02/17 1700 10/03/17 0405  NA 141 137  --  136  K 4.2 4.3  --  4.5  CL 110 100*  --  103  CO2 22  --   --  27  GLUCOSE 111* 207*  --  205*  BUN 6 13  --  10  CREATININE 0.75 0.80 0.95 0.89  CALCIUM 8.2*  --   --  8.3*    PT/INR:  Recent Labs    10/01/17 1353  LABPROT 18.4*  INR 1.54   ABG    Component Value Date/Time   PHART 7.271 (L) 10/01/2017 2036   HCO3 21.4 10/01/2017 2036   TCO2 25 10/02/2017 1645   ACIDBASEDEF 6.0 (H) 10/01/2017 2036   O2SAT 99.0 10/01/2017 2036   CBG (last 3)  Recent Labs   10/02/17 2122 10/02/17 2209 10/03/17 0821  GLUCAP 198* 218* 149*    Assessment/Plan: S/P Procedure(s) (LRB): CORONARY ARTERY BYPASS GRAFTING (CABG) x5 using the left internal mammary artery, the right greater saphenous vein harvested endoscopically, and the left radial artery. (N/A) TRANSESOPHAGEAL ECHOCARDIOGRAM (TEE) (N/A) RADIAL ARTERY HARVEST (Left) Plan for transfer to step-down: see transfer orders  CV- in SR with PACs- will increase lopressor  BP a little soft- decrease Imdur to 15 mg daily  ASA, statin  RESP- IS for atelectasis  RENAL- creatinine and lytes Ok- dc Foley  ENDO- CBG elevated- increase Novolog at meals, continue levemir, SSI  Continue cardiac rehab    LOS: 2 days    Loreli SlotSteven C Hendrickson 10/03/2017

## 2017-10-04 ENCOUNTER — Inpatient Hospital Stay (HOSPITAL_COMMUNITY): Payer: BLUE CROSS/BLUE SHIELD

## 2017-10-04 LAB — BASIC METABOLIC PANEL
ANION GAP: 9 (ref 5–15)
BUN: 5 mg/dL — ABNORMAL LOW (ref 6–20)
CHLORIDE: 101 mmol/L (ref 101–111)
CO2: 27 mmol/L (ref 22–32)
Calcium: 8.4 mg/dL — ABNORMAL LOW (ref 8.9–10.3)
Creatinine, Ser: 0.76 mg/dL (ref 0.61–1.24)
GFR calc non Af Amer: >60 mL/min (ref >60)
Glucose, Bld: 106 mg/dL — ABNORMAL HIGH (ref 65–99)
POTASSIUM: 3.9 mmol/L (ref 3.5–5.1)
SODIUM: 137 mmol/L (ref 135–145)

## 2017-10-04 LAB — CBC
HCT: 35.2 % — ABNORMAL LOW (ref 39.0–52.0)
Hemoglobin: 11.8 g/dL — ABNORMAL LOW (ref 13.0–17.0)
MCH: 29.9 pg (ref 26.0–34.0)
MCHC: 33.5 g/dL (ref 30.0–36.0)
MCV: 89.1 fL (ref 78.0–100.0)
PLATELETS: 167 10*3/uL (ref 150–400)
RBC: 3.95 MIL/uL — ABNORMAL LOW (ref 4.22–5.81)
RDW: 13.2 % (ref 11.5–15.5)
WBC: 10.4 10*3/uL (ref 4.0–10.5)

## 2017-10-04 LAB — GLUCOSE, CAPILLARY
GLUCOSE-CAPILLARY: 85 mg/dL (ref 65–99)
GLUCOSE-CAPILLARY: 195 mg/dL — AB (ref 65–99)
GLUCOSE-CAPILLARY: 155 mg/dL — AB (ref 65–99)
Glucose-Capillary: 141 mg/dL — ABNORMAL HIGH (ref 65–99)

## 2017-10-04 MED ORDER — FUROSEMIDE 40 MG PO TABS
40.0000 mg | ORAL_TABLET | Freq: Every day | ORAL | Status: DC
  Administered 2017-10-05: 40 mg via ORAL
  Filled 2017-10-04: qty 1

## 2017-10-04 MED ORDER — METOPROLOL TARTRATE 25 MG PO TABS
37.5000 mg | ORAL_TABLET | Freq: Two times a day (BID) | ORAL | Status: DC
  Administered 2017-10-04 – 2017-10-05 (×3): 37.5 mg via ORAL
  Filled 2017-10-04 (×3): qty 1

## 2017-10-04 MED ORDER — POTASSIUM CHLORIDE CRYS ER 20 MEQ PO TBCR
20.0000 meq | EXTENDED_RELEASE_TABLET | Freq: Every day | ORAL | Status: DC
  Administered 2017-10-05: 20 meq via ORAL
  Filled 2017-10-04: qty 1

## 2017-10-04 MED ORDER — FUROSEMIDE 10 MG/ML IJ SOLN
40.0000 mg | Freq: Once | INTRAMUSCULAR | Status: AC
  Administered 2017-10-04: 40 mg via INTRAVENOUS
  Filled 2017-10-04: qty 4

## 2017-10-04 MED ORDER — POTASSIUM CHLORIDE CRYS ER 20 MEQ PO TBCR
40.0000 meq | EXTENDED_RELEASE_TABLET | Freq: Once | ORAL | Status: AC
  Administered 2017-10-04: 40 meq via ORAL
  Filled 2017-10-04: qty 2

## 2017-10-04 NOTE — Progress Notes (Addendum)
      [address]       [place] [id number]             [phone]        3 Days Post-Op Procedure(s) (LRB): CORONARY ARTERY BYPASS GRAFTING (CABG) x5 using the left internal mammary artery, the right greater saphenous vein harvested endoscopically, and the left radial artery. (N/A) TRANSESOPHAGEAL ECHOCARDIOGRAM (TEE) (N/A) RADIAL ARTERY HARVEST (Left)  Subjective: Patient has complaints of pain in RIGHT thigh ([facility])  Objective: Vital signs in last 24 hours: Temp:  [97.6 F (36.4 C)-99.2 F (37.3 C)] 98.7 F (37.1 C) (02/21 0319) Pulse Rate:  [61-94] 84 (02/20 1303) Cardiac Rhythm: Normal sinus rhythm (02/20 2000) Resp:  [9-21] 21 (02/20 1303) BP: (96-126)/(66-84) 119/78 (02/20 2343) SpO2:  [87 %-98 %] 98 % (02/20 1303) Weight:  [197 lb 8 oz (89.6 kg)] 197 lb 8 oz (89.6 kg) (02/21 0319)  Pre op weight 83 kg Current Weight  10/04/17 197 lb 8 oz (89.6 kg)     Intake/Output from previous day: 02/20 0701 - 02/21 0700 In: 762 [P.O.:702; I.V.:60] Out: -    Physical Exam:  Cardiovascular: RRR Pulmonary: Slightly diminished at bases Abdomen: Soft, non tender, bowel sounds present. Extremities: Mild bilateral lower extremity edema. Motor/sensory LUE intact Wounds: Aquacel and LUE dressings intact  Lab Results: CBC: Recent Labs    10/03/17 0405 10/04/17 0320  WBC 12.3* 10.4  HGB 11.3* 11.8*  HCT 34.3* 35.2*  PLT 162 167   BMET:  Recent Labs    10/03/17 0405 10/04/17 0320  NA 136 137  K 4.5 3.9  CL 103 101  CO2 27 27  GLUCOSE 205* 106*  BUN 10 5*  CREATININE 0.89 0.76  CALCIUM 8.3* 8.4*    PT/INR:  Lab Results  Component Value Date   INR 1.54 10/01/2017   INR 1.07 10/01/2017   ABG:  INR: Will add last result for INR, ABG once components are confirmed Will add last 4 CBG results once components are confirmed  Assessment/Plan:  1. CV - SR/ST with PVCs. On Lopressor 25 mg bid and Imdur 15 mg daily. Will increase  Lopressor. 2.  Pulmonary - On room air this am. CXR this am appears to show small bilateral pleural effusions and atelectasis. Encourage incentive spirometer. 3. Volume Overload - Will give LasixIV this am 4.  Acute blood loss anemia - H and H stable at 11.8 and 35.2 5. Supplement potassium 6. DM-CBGs 98/187/85 . On Insulin. Pre op HGA1C 6.6. 7. Remove EPW 8. Possibly home in am  Lelon HuhDonielle M [facility]ZimmermanPA-C 10/04/2017,7:13 AM Patient seen and examined, agree with above. Right thigh incision OK.  Nausea with oxycodone, improved with Zofran Home tomorrow if he continues to progress  Viviann SpareSteven C. Dorris FetchHendrickson, MD [facility] [phone]

## 2017-10-04 NOTE — Progress Notes (Addendum)
Donielle,PA pulled Right Pacer wire. Patient tolerated removal well. Wire was intact. Gauze placed over site. Patient will remain on bed rest for 1 hour. Will continue to monitor patient.

## 2017-10-04 NOTE — Progress Notes (Signed)
Pulled Patient pacer wires per provider orders. Patient VS are stable HR 71, B/P 110/79. Patient is alert and oriented. Left Pacer wire came out ends intact no complications. Right pacer wire had resistance while attempting to remove. Asked a second nurse to attempt removal, resistance met. Contacted PA and notified of Right pacer wire resistance 

## 2017-10-04 NOTE — Progress Notes (Signed)
CARDIAC REHAB PHASE I   PRE:  Rate/Rhythm: 77 SR  BP:  Supine: 123/78  Sitting:   Standing:    SaO2: 93%RA  MODE:  Ambulation: 470 ft   POST:  Rate/Rhythm: 110 ST  BP:  Supine:   Sitting: 134/79  Standing:    SaO2: 94%RA 1025-1057 Pt walked 470 ft on RA with asst x 1. Gait steady. Would like rolling walker for home use. Notified case Production designer, theatre/television/filmmanager. Discussed CRP 2 and will refer to [place]. Began sternal ed and gave handout on In the tube.    Luetta Nuttingharlene Lyza Houseworth, RN BSN  10/04/2017 10:54 AM

## 2017-10-04 NOTE — Care Management Note (Signed)
Case Management Note Louis PieriniKristi Chrislyn Seedorf RN, BSN Unit 4E-Case Manager-- 2H coverage [phone]  Patient Details  Name: Louis Byrd MRN: [id number] Date of Birth: 08/07/1976  Subjective/Objective:  Pt admitted s/p CABGx5                   Action/Plan: PTA pt lived at home with wife- CM to follow for transition of care needs  Expected Discharge Date:                  Expected Discharge Plan:  Home/Self Care  In-House Referral:     Discharge planning Services  CM Consult  Post Acute Care Choice:  Durable Medical Equipment Choice offered to:  Patient  DME Arranged:  Walker rolling DME Agency:  [facility]  [facility]     Status of Service:  Completed, signed off  If discussed at Long Length of Stay Meetings, dates discussed:    Discharge Disposition:   Additional Comments:  10/04/17- 1130- Louis PieriniKristi Raiza Kiesel RN, CM- per cardiac rehab- pt would like a RW for home- [facility]up[facility]Donna [facility]W to be delivered to room prior to discharge.  Darrold SpanWebster, Mitchel Delduca Hall, RN 10/04/2017, 11:48 AM

## 2017-10-05 ENCOUNTER — Other Ambulatory Visit: Payer: Self-pay | Admitting: Physician Assistant

## 2017-10-05 DIAGNOSIS — Z736 Limitation of activities due to disability: Secondary | ICD-10-CM

## 2017-10-05 LAB — GLUCOSE, CAPILLARY
GLUCOSE-CAPILLARY: 40 mg/dL — AB (ref 65–99)
GLUCOSE-CAPILLARY: 78 mg/dL (ref 65–99)
Glucose-Capillary: 209 mg/dL — ABNORMAL HIGH (ref 65–99)

## 2017-10-05 MED ORDER — POTASSIUM CHLORIDE CRYS ER 20 MEQ PO TBCR: 20.0000 meq | EXTENDED_RELEASE_TABLET | Freq: Every day | ORAL | 0 refills | Status: DC

## 2017-10-05 MED ORDER — ASPIRIN 325 MG PO TBEC: 325.0000 mg | DELAYED_RELEASE_TABLET | Freq: Every day | ORAL | 0 refills | Status: DC

## 2017-10-05 MED ORDER — FUROSEMIDE 40 MG PO TABS: 40.0000 mg | ORAL_TABLET | Freq: Every day | ORAL | 0 refills | Status: DC

## 2017-10-05 MED ORDER — ONDANSETRON HCL 4 MG PO TABS: 4.0000 mg | ORAL_TABLET | Freq: Three times a day (TID) | ORAL | 0 refills | Status: DC | PRN

## 2017-10-05 MED ORDER — METOPROLOL TARTRATE 25 MG PO TABS: 37.5000 mg | ORAL_TABLET | Freq: Two times a day (BID) | ORAL | 1 refills | Status: DC

## 2017-10-05 MED ORDER — ISOSORBIDE MONONITRATE ER 30 MG PO TB24: 15.0000 mg | ORAL_TABLET | Freq: Every day | ORAL | 1 refills | Status: DC

## 2017-10-05 MED ORDER — OXYCODONE HCL 5 MG PO TABS: ORAL_TABLET | ORAL | 0 refills | Status: DC

## 2017-10-05 NOTE — Progress Notes (Signed)
Patient is ready for discharge. Patient is alert and oriented and VS are stable. Patient has all of his belongings. All of his questions regarding his discharge have been answered. IV has been removed without complication and patient tolerated removal well. Patient has been DC'd from telemetry and box removed. Patient will be transported home by his wife, Selena BattenKim who is here with him. He will leave the unit by wheelchair and meet his wife at the front entrance.

## 2017-10-05 NOTE — Progress Notes (Signed)
      [address]       [place] [id number]             [phone]        4 Days Post-Op Procedure(s) (LRB): CORONARY ARTERY BYPASS GRAFTING (CABG) x5 using the left internal mammary artery, the right greater saphenous vein harvested endoscopically, and the left radial artery. (N/A) TRANSESOPHAGEAL ECHOCARDIOGRAM (TEE) (N/A) RADIAL ARTERY HARVEST (Left)  Subjective: Patient has complaints of pain in his back. He states the bed is "awful".  Objective: Vital signs in last 24 hours: Temp:  [97.4 F (36.3 C)-98.7 F (37.1 C)] 98.3 F (36.8 C) (02/22 0627) Pulse Rate:  [73-114] 78 (02/22 0627) Cardiac Rhythm: Normal sinus rhythm (02/21 2030) Resp:  [17-19] 19 (02/22 0627) BP: (110-133)/(71-88) 133/88 (02/22 0627) SpO2:  [92 %-94 %] 93 % (02/22 0627) Weight:  [192 lb (87.1 kg)] 192 lb (87.1 kg) (02/22 0627)  Pre op weight 83 kg Current Weight  10/05/17 192 lb (87.1 kg)     Intake/Output from previous day: 02/21 0701 - 02/22 0700 In: 240 [P.O.:240] Out: -    Physical Exam:  Cardiovascular: RRR Pulmonary: Slightly diminished at bases Abdomen: Soft, non tender, bowel sounds present. Extremities: Mild bilateral lower extremity edema. Motor/sensory LUE intact Wounds: Clean and dry  Lab Results: CBC: Recent Labs    10/03/17 0405 10/04/17 0320  WBC 12.3* 10.4  HGB 11.3* 11.8*  HCT 34.3* 35.2*  PLT 162 167   BMET:  Recent Labs    10/03/17 0405 10/04/17 0320  NA 136 137  K 4.5 3.9  CL 103 101  CO2 27 27  GLUCOSE 205* 106*  BUN 10 5*  CREATININE 0.89 0.76  CALCIUM 8.3* 8.4*    PT/INR:  Lab Results  Component Value Date   INR 1.54 10/01/2017   INR 1.07 10/01/2017   ABG:  INR: Will add last result for INR, ABG once components are confirmed Will add last 4 CBG results once components are confirmed  Assessment/Plan:  1. CV - SR/ST with PVCs. On Lopressor 37.5 mg bid and Imdur 15 mg daily.  2.  Pulmonary - On room air this am.   Encourage incentive spirometer. 3. Volume Overload - Will give Lasix 40 mg daily and continue for one week after discharge 4.  Acute blood loss anemia - Last H and H stable at 11.8 and 35.2 5. DM-CBGs 155/141/40 . On Insulin. Pre op HGA1C 6.6. 7. Discharge later today  Louis Byrd M ZimmermanPA-C 10/05/2017,7:13 AM

## 2017-10-05 NOTE — Progress Notes (Signed)
[id number] Education completed with pt and wife who voiced understanding. Reviewed sternal precautions, IS, ex ed and heart healthy and watching carbs. Many questions by pt and wife. Understanding voiced of ed. Referring to [place] Phase 2. Pt does not want walker now. Discussed not chewing or dipping tobacco. Gave tobacco cessation handout.  Luetta Nuttingharlene Maxwel Meadowcroft RN BSN 10/05/2017 9:53 AM

## 2017-10-05 NOTE — Discharge Instructions (Signed)
Patient instructed to use dry 4x4s with tape and apply to chest tube wounds. Change dressing daily and PRN.  Once "oozing" stops, may let open to air.  Coronary Artery Bypass Grafting, Care After This sheet gives you information about how to care for yourself after your procedure. Your health care provider may also give you more specific instructions. If you have problems or questions, contact your health care provider. What can I expect after the procedure? After the procedure, it is common to have:  Nausea and a lack of appetite.  Constipation.  Weakness and fatigue.  Depression or irritability.  Pain or discomfort in your incision areas.  Follow these instructions at home: Medicines  Take over-the-counter and prescription medicines only as told by your health care provider. Do not stop taking medicines or start any new medicines without approval from your health care provider.  If you were prescribed an antibiotic medicine, take it as told by your health care provider. Do not stop taking the antibiotic even if you start to feel better.  Do not drive or use heavy machinery while taking prescription pain medicine. Incision care  Follow instructions from your health care provider about how to take care of your incisions. Make sure you: ? Wash your hands with soap and water before you change your bandage (dressing). If soap and water are not available, use hand sanitizer. ? Change your dressing as told by your health care provider. ? Leave stitches (sutures), skin glue, or adhesive strips in place. These skin closures may need to stay in place for 2 weeks or longer. If adhesive strip edges start to loosen and curl up, you may trim the loose edges. Do not remove adhesive strips completely unless your health care provider tells you to do that.  Keep incision areas clean, dry, and protected.  Check your incision areas every day for signs of infection. Check for: ? More redness,  swelling, or pain. ? More fluid or blood. ? Warmth. ? Pus or a bad smell.  If incisions were made in your legs: ? Avoid crossing your legs. ? Avoid sitting for long periods of time. Change positions every 30 minutes. ? Raise (elevate) your legs when you are sitting. Bathing  Do not take baths, swim, or use a hot tub until your health care provider approves.  Only take sponge baths. Pat the incisions dry. Do not rub incisions with a washcloth or towel.  Ask your health care provider when you can shower. Eating and drinking  Eat foods that are high in fiber, such as raw fruits and vegetables, whole grains, beans, and nuts. Meats should be lean cut. Avoid canned, processed, and fried foods. This can help prevent constipation and is a recommended part of a heart-healthy diet.  Drink enough fluid to keep your urine clear or pale yellow.  Limit alcohol intake to no more than 1 drink a day for nonpregnant women and 2 drinks a day for men. One drink equals 12 oz of beer, 5 oz of wine, or 1 oz of hard liquor. Activity  Rest and limit your activity as told by your health care provider. You may be instructed to: ? Stop any activity right away if you have chest pain, shortness of breath, irregular heartbeats, or dizziness. Get help right away if you have any of these symptoms. ? Move around frequently for short periods or take short walks as directed by your health care provider. Gradually increase your activities. You may need physical therapy  or cardiac rehabilitation to help strengthen your muscles and build your endurance. ? Avoid lifting, pushing, or pulling anything that is heavier than 10 lb (4.5 kg) for at least 6 weeks or as told by your health care provider.  Do not drive until your health care provider approves.  Ask your health care provider when you may return to work.  Ask your health care provider when you may resume sexual activity. General instructions  Do not use any  products that contain nicotine or tobacco, such as cigarettes and e-cigarettes. If you need help quitting, ask your health care provider.  Take 2-3 deep breaths every few hours during the day, while you recover. This helps expand your lungs and prevent complications like pneumonia after surgery.  If you were given a device called an incentive spirometer, use it several times a day to practice deep breathing. Support your chest with a pillow or your arms when you take deep breaths or cough.  Wear compression stockings as told by your health care provider. These stockings help to prevent blood clots and reduce swelling in your legs.  Weigh yourself every day. This helps identify if your body is holding (retaining) fluid that may make your heart and lungs work harder.  Keep all follow-up visits as told by your health care provider. This is important. Contact a health care provider if:  You have more redness, swelling, or pain around any incision.  You have more fluid or blood coming from any incision.  Any incision feels warm to the touch.  You have pus or a bad smell coming from any incision  You have a fever.  You have swelling in your ankles or legs.  You have pain in your legs.  You gain 2 lb (0.9 kg) or more a day.  You are nauseous or you vomit.  You have diarrhea. Get help right away if:  You have chest pain that spreads to your jaw or arms.  You are short of breath.  You have a fast or irregular heartbeat.  You notice a "clicking" in your breastbone (sternum) when you move.  You have numbness or weakness in your arms or legs.  You feel dizzy or light-headed. Summary  After the procedure, it is common to have pain or discomfort in the incision areas.  Do not take baths, swim, or use a hot tub until your health care provider approves.  Gradually increase your activities. You may need physical therapy or cardiac rehabilitation to help strengthen your muscles and  build your endurance.  Weigh yourself every day. This helps identify if your body is holding (retaining) fluid that may make your heart and lungs work harder. This information is not intended to replace advice given to you by your health care provider. Make sure you discuss any questions you have with your health care provider. Document Released: 02/17/2005 Document Revised: 06/19/2016 Document Reviewed: 06/19/2016 Elsevier Interactive Patient Education  [employer]2018 [employer]vier Inc.

## 2017-10-05 NOTE — Progress Notes (Signed)
Hypoglycemic Event  CBG: 40  Treatment: 4oz orange juice, peanut butter, graham                    Crackers, 240ml whole milk.  Symptoms: None  Follow-up CBG: Time:06:48 CBG Result:78  Possible Reasons for Event: Poor appetite  Comments/MD notified:No    Anne NgGardner, Marylyn Appenzeller Raymond

## 2017-10-12 DIAGNOSIS — Z951 Presence of aortocoronary bypass graft: Secondary | ICD-10-CM

## 2017-10-12 HISTORY — DX: Presence of aortocoronary bypass graft: Z95.1

## 2017-10-17 NOTE — Progress Notes (Signed)
Cardiology Office Note   Date:  10/18/2017   ID:  Louis Byrd, DOB 1975-09-12, MRN [id number]  PCP:  Dema Severin, NP  Cardiologist:  Was to follow-up [place], [place]. (Was seen by Dr. Royann Shivers recent hospitalization)  Chief Complaint  Patient presents with  . Coronary Artery Disease  . Hospitalization Follow-up     History of Present Illness: Louis Byrd is a delightful  42 y.o. male who presents for post hospital physician follow-up after admission for unstable angina, abnormal stress test, with other history to include Type 1 diabetes mellitus, palpitations, and hyperlipidemia.   The patient had no prior cardiac history. Nuclear medicine stress test revealed apical and apical septal reversible ischemia, with reduced EF of 42%. This was found at [facility] during admission there initially, and then transferred to [facility] for further evaluation. The patient was planned for diagnostic cardiac catheterization.  Cardiac catheterization was completed on 09/26/2017. As revealed severe multivessel CAD with total occlusion of the mid LAD, severe stenosis of the RCA, and severe stenosis of the left circumflex. The patient had normal LV systolic function of 60% with normal LVEDP.  Due to catheterization results the patient had consultation by cardiothoracic surgery to evaluate for CABG. Patient underwent coronary artery bypass grafting 5 by Dr. Charlett Lango on 10/01/2017. (LIMA to LAD, left radial artery to obtuse marginal 2, sequential saphenous vein graft to the posterior descending and posterior lateral. Endoscopic vein harvest of the right leg.   The patient was subsequently discharged on 10/03/2017. The patient was prescribed atorvastatin secondary prevention, aspirin 325 mg daily, furosemide 40 mg daily for one week,isosorbide for one month, along with ongoing insulin, levothyroxine,  metoprolol and potassium. He is here on posthospitalization follow-up before  being established in our [facility] cardiology location.   He is here today doing really well with some soreness in his neck and clavicle area. He denies any bleeding, recurrent chest pain, dyspnea, he does have some fatigue. He is medically compliant. His sister is making sure he gets his medications, but unfortunately gave him a full 30 mg of isosorbide about a week until realizing only was to be a a lower dose of 15 mg. Since she realized that she reduce the dose as prescribed. The patient was feeling a little warm fatigue on higher doses. Dropping the dose he is feeling better and has not had recurrent chest pain.  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 He is interested in cardiac rehabilitation.  Past Medical History:  Diagnosis Date  . Arthritis   . Bursitis of right hip   . Chest pain    a. 1/2/019 Ex [facility]): Ex time 8:00 No ecg changes. Large severe apical rev defect and small mod sev apicoseptal rev defect. EF 42%.  . Coronary artery disease   . Hyperlipidemia   . Hypothyroidism   . Palpitations    a. premature atrial contractions.  . Pulmonary nodule    a. 08/2017 CXR - [facility]: "pulm nodule posterior lung base on lateral film, rec noncontrast chest CT for further eval.  . Seizures (HCC)    "only related to low blood sugars" (09/25/2017)  . Type I diabetes mellitus (HCC)    a. Dx age 69;  b. Most recent A1c 6.3.    Past Surgical History:  Procedure Laterality Date  . CORONARY ARTERY BYPASS GRAFT N/A 10/01/2017   Procedure: CORONARY ARTERY BYPASS GRAFTING (CABG) x5 using  the left internal mammary artery, the right greater saphenous vein harvested endoscopically, and the left radial artery.;  Surgeon: Loreli Slot, MD;  Location: [facility] OR;  Service: Open Heart Surgery;  Laterality: N/A;  . I and D of groin  2017   for MRSA infection  . KNEE ARTHROSCOPY Right 1990s  . LEFT HEART CATH AND CORONARY ANGIOGRAPHY  N/A 09/26/2017   Procedure: LEFT HEART CATH AND CORONARY ANGIOGRAPHY;  Surgeon: Tonny Bollman, MD;  Location: [facility] INVASIVE CV LAB;  Service: Cardiovascular;  Laterality: N/A;  . RADIAL ARTERY HARVEST Left 10/01/2017   Procedure: RADIAL ARTERY HARVEST;  Surgeon: Loreli Slot, MD;  Location: [facility] OR;  Service: Open Heart Surgery;  Laterality: Left;  . TEE WITHOUT CARDIOVERSION N/A 10/01/2017   Procedure: TRANSESOPHAGEAL ECHOCARDIOGRAM (TEE);  Surgeon: Loreli Slot, MD;  Location: [facility] OR;  Service: Open Heart Surgery;  Laterality: N/A;     Current Outpatient Medications  Medication Sig Dispense Refill  . aspirin EC 325 MG EC tablet Take 1 tablet (325 mg total) by mouth daily. 30 tablet 0  . furosemide (LASIX) 40 MG tablet Take 1 tablet (40 mg total) by mouth daily. For one week then stop. 7 tablet 0  . insulin aspart (NOVOLOG) 100 UNIT/ML injection Inject 4-13 Units into the skin 3 (three) times daily before meals. Sliding Scale depending on food that is consumed.    . isosorbide mononitrate (IMDUR) 30 MG 24 hr tablet Take 0.5 tablets (15 mg total) by mouth daily. 30 tablet 3  . levothyroxine (SYNTHROID, LEVOTHROID) 175 MCG tablet Take 175 mcg by mouth daily.  0  . metoprolol tartrate (LOPRESSOR) 25 MG tablet Take 1.5 tablets (37.5 mg total) by mouth 2 (two) times daily. 60 tablet 1  . ondansetron (ZOFRAN) 4 MG tablet Take 1 tablet (4 mg total) by mouth every 8 (eight) hours as needed for nausea or vomiting. 30 tablet 0  . oxyCODONE (OXY IR/ROXICODONE) 5 MG immediate release tablet Take 5 mg by mouth every 4-6 hours PRN severe pain 30 tablet 0  . potassium chloride SA (K-DUR,KLOR-CON) 20 MEQ tablet Take 1 tablet (20 mEq total) by mouth daily. For one week then stop 7 tablet 0  . TRESIBA FLEXTOUCH 100 UNIT/ML SOPN FlexTouch Pen Inject 40 Units into the skin daily.  0  . rosuvastatin (CRESTOR) 20 MG tablet Take 1 tablet (20 mg total) by mouth every evening. 30 tablet 3   No current  facility-administered medications for this visit.     Allergies:   Patient has no known allergies.    Social History:  The patient  reports that  has never smoked. His smokeless tobacco use includes chew and snuff. He reports that he drinks about 16.8 oz of alcohol per week. He reports that he does not use drugs.   Family History:  The patient's family history includes Diabetes Mellitus I in his brother; Heart attack in his father.    ROS: All other systems are reviewed and negative. Unless otherwise mentioned in H&P    PHYSICAL EXAM: VS:  BP 120/64   Pulse 70   Ht 5\' 9"  (1.753 m)   Wt 185 lb 3.2 oz (84 kg)   BMI 27.35 kg/m  , BMI Body mass index is 27.35 kg/m. GEN: Well nourished, well developed, in no acute distress  HEENT: normal  Neck: no JVD, carotid bruits, or masses Cardiac: RRR; no murmurs, rubs, or gallops,no edema  Respiratory:  clear to auscultation bilaterally, normal work of  breathing GI: soft, nontender, nondistended, + BS MS: no deformity or atrophy well healed sternotomy, chest tube sites are well healed as well. The patient has left radial vein harvest site which does have some erythema along the incision is a little tender, right leg saphenous vein graft site is healing well and is also a little tender with some mild erythema noted. No harvest site appear to be infected, there is no bleeding. Skin: warm and dry, no rash Neuro:  Strength and sensation are intact Psych: euthymic mood, full affect   Recent Labs: 10/01/2017: ALT 33 10/02/2017: Magnesium 2.2 10/04/2017: BUN 5; Creatinine, Ser 0.76; Hemoglobin 11.8; Platelets 167; Potassium 3.9; Sodium 137    Lipid Panel No results found for: CHOL, TRIG, HDL, CHOLHDL, VLDL, LDLCALC, LDLDIRECT    Wt Readings from Last 3 Encounters:  10/18/17 185 lb 3.2 oz (84 kg)  10/05/17 192 lb (87.1 kg)  09/27/17 180 lb (81.6 kg)      Other studies Reviewed:  Cardiac Cath  10/10/17  Conclusion     Mid RCA lesion  is 80% stenosed.  Post Atrio-1 lesion is 80% stenosed.  Post Atrio-2 lesion is 90% stenosed.  Ost Cx to Prox Cx lesion is 80% stenosed.  Prox Cx to Mid Cx lesion is 90% stenosed.  Ost 2nd Mrg lesion is 50% stenosed.  Ost 1st Diag to 1st Diag lesion is 95% stenosed.  Prox LAD lesion is 100% stenosed.  The left ventricular systolic function is normal.  LV end diastolic pressure is normal.  The left ventricular ejection fraction is 55-65% by visual estimate.   1. Severe multivessel CAD with total occlusion of the mid-LAD, severe stenosis of the RCA, and severe stenosis of the left circumflex 2. Normal LV function with LVEF estimated at 60%, normal LVEDP  The patient has Type I DM and severe 3 vessel CAD with normal LV function, surgical coronary anatomy. Will place TCTS consult for CABG. Start heparin after TR band off.      ASSESSMENT AND PLAN:  1.  CAD: Multivessel disease found on cardiac catheterization after undergoing nuclear medicine stress [facility] with transfer to [facility] for further evaluation. Subsequent coronary artery bypass grafting  X 5 by Dr. Dorris Fetch 10/01/2017.  He is recovering well but does have some overall neck shoulder and back soreness. He also has some incisional soreness proving. I reviewed his cardiac catheterization, and also bypass surgery. I've answered multiple questions concerning activities, medications, and need to have cardiac rehabilitation. He verbalizes understanding and is willing to proceed with rehabilitation at [facility].  The patient will be established with our aspirin will base cardiology group, but will be seen at [facility] as that practice site is not open yet. I've asked him to follow-up within a month to establish with a cardiologist and to be sure that rehabilitation in connected to that practice location.  I have refilled isosorbide 15 mg daily for one month. He was only supposed to take it for a month but  was given a full tablet for about a week and has run out. He is to follow-up with cardiothoracic surgeon on arch 26 2019.  A letter is provided to take back to his workplace facility noted that he has been seen today require continued absence from work and how being released by cardiothoracic surgeon, and seen on follow-up in our [place] cardiology location.  2. Hypercholesterolemia:  He has had trouble tolerating statin therapy. He is currently on atorvastatin 40 mg  daily. He said it makes him feel extremely fatigued, but no myalgias. When he changed to rosuvastatin 20 mg daily CBC and tolerated that. He may be a candidate Repatha. We'll defer to follow-up cardiology appointment for recommendation.  3. Type 1 diabetes: He will continue to be followed by PCP for insulin adjustment and management.   Current medicines are reviewed at length with the patient today.  Over 40 minutes were spent with this patient and family member answering multiple questions. He will follow up with cardiothoracic surgeon for cardiac rehabilitation.  Labs/ tests ordered today include:  Bettey MareKathryn M. Liborio NixonLawrence DNP, ANP, AACC   10/18/2017 10:15 AM    [facility] [address], [place], [place] [address] Phone: [phone](336) [phone]; Fax: [phone]) [phone]

## 2017-10-18 ENCOUNTER — Other Ambulatory Visit: Payer: Self-pay | Admitting: *Deleted

## 2017-10-18 ENCOUNTER — Encounter: Payer: Self-pay | Admitting: Adult Health

## 2017-10-18 ENCOUNTER — Ambulatory Visit (INDEPENDENT_AMBULATORY_CARE_PROVIDER_SITE_OTHER): Payer: BLUE CROSS/BLUE SHIELD | Admitting: Adult Health

## 2017-10-18 VITALS — BP 120/64 | HR 70 | Ht 69.0 in | Wt 185.2 lb

## 2017-10-18 DIAGNOSIS — E108 Type 1 diabetes mellitus with unspecified complications: Secondary | ICD-10-CM

## 2017-10-18 DIAGNOSIS — E7800 Pure hypercholesterolemia, unspecified: Secondary | ICD-10-CM

## 2017-10-18 DIAGNOSIS — Z951 Presence of aortocoronary bypass graft: Secondary | ICD-10-CM | POA: Diagnosis not present

## 2017-10-18 DIAGNOSIS — I2510 Atherosclerotic heart disease of native coronary artery without angina pectoris: Secondary | ICD-10-CM

## 2017-10-18 MED ORDER — ISOSORBIDE MONONITRATE ER 30 MG PO TB24: 15.0000 mg | ORAL_TABLET | Freq: Every day | ORAL | 3 refills | Status: DC

## 2017-10-18 MED ORDER — ISOSORBIDE MONONITRATE ER 30 MG PO TB24: 15.0000 mg | ORAL_TABLET | Freq: Every day | ORAL | 2 refills | Status: DC

## 2017-10-18 MED ORDER — ROSUVASTATIN CALCIUM 20 MG PO TABS: 20.0000 mg | ORAL_TABLET | Freq: Every evening | ORAL | 3 refills | Status: DC

## 2017-10-18 NOTE — Patient Instructions (Signed)
Medication Instructions:  STOP LASIX  STOP ATORVASTATIN  START CRESTOR 20MG  IN THE EVENING  If you need a refill on your cardiac medications before your next appointment, please call your pharmacy.  Special Instructions: [facility]  Follow-Up: Your physician wants you to follow-up in: OUR  OFFICE IN ONE MONTH.  Thank you for choosing [facility] at [facility]!!

## 2017-10-26 ENCOUNTER — Other Ambulatory Visit: Payer: Self-pay | Admitting: Adult Health

## 2017-11-05 ENCOUNTER — Other Ambulatory Visit: Payer: Self-pay | Admitting: Thoracic Surgery (Cardiothoracic Vascular Surgery)

## 2017-11-05 DIAGNOSIS — Z951 Presence of aortocoronary bypass graft: Secondary | ICD-10-CM

## 2017-11-06 ENCOUNTER — Ambulatory Visit
Admission: RE | Admit: 2017-11-06 | Discharge: 2017-11-06 | Disposition: A | Discharge status: Home / Self Care | Payer: BLUE CROSS/BLUE SHIELD | Source: Ambulatory Visit | Attending: Thoracic Surgery (Cardiothoracic Vascular Surgery) | Admitting: Thoracic Surgery (Cardiothoracic Vascular Surgery)

## 2017-11-06 ENCOUNTER — Other Ambulatory Visit: Payer: Self-pay

## 2017-11-06 ENCOUNTER — Ambulatory Visit (INDEPENDENT_AMBULATORY_CARE_PROVIDER_SITE_OTHER): Payer: Self-pay | Admitting: Thoracic Surgery (Cardiothoracic Vascular Surgery)

## 2017-11-06 ENCOUNTER — Other Ambulatory Visit: Payer: Self-pay | Admitting: *Deleted

## 2017-11-06 ENCOUNTER — Encounter: Payer: Self-pay | Admitting: Thoracic Surgery (Cardiothoracic Vascular Surgery)

## 2017-11-06 VITALS — BP 133/81 | HR 85 | Resp 16 | Ht 69.0 in | Wt 187.8 lb

## 2017-11-06 DIAGNOSIS — R911 Solitary pulmonary nodule: Secondary | ICD-10-CM

## 2017-11-06 DIAGNOSIS — R918 Other nonspecific abnormal finding of lung field: Secondary | ICD-10-CM

## 2017-11-06 DIAGNOSIS — Z951 Presence of aortocoronary bypass graft: Secondary | ICD-10-CM

## 2017-11-06 NOTE — Progress Notes (Signed)
[address]       [place] [id number]             [phone]     HPI: Mr. Louis Byrd returns for scheduled postoperative follow-up visit  Ladean Rayallen Walsworth is a 42 year old man with a history of type 1 diabetes, hyperlipidemia, hypothyroidism, arthritis, and seizures.  He presented with a 6339-month history of fatigue.  About a week prior to admission he was having chest tightness and shortness of breath.  He ruled out for MI but stress test showed reversible ischemia.  His ejection fraction was 42%.  At catheterization he had severe three-vessel disease with a totally occluded LAD.  He underwent coronary bypass grafting x5 on 10/01/2017.  We did use a radial graft in addition to the LIMA and saphenous vein.  Postoperative course was uncomplicated and went home on day 4.  He feels well.  He says he already feels better than he had for several months prior to surgery.  He does have some soreness but is not taking any narcotics.  He has not had any recurrent angina.  Past Medical History:  Diagnosis Date  . Arthritis   . Bursitis of right hip   . Chest pain    a. 1/2/019 Ex MV [facility](): Ex time 8:00 No ecg changes. Large severe apical rev defect and small mod sev apicoseptal rev defect. EF 42%.  . Coronary artery disease   . Hyperlipidemia   . Hypothyroidism   . Palpitations    a. premature atrial contractions.  . Pulmonary nodule    a. 08/2017 [facility]: "pulm nodule posterior lung base on lateral film, rec noncontrast chest CT for further eval.  . Seizures (HCC)    "only related to low blood sugars" (09/25/2017)  . Type I diabetes mellitus (HCC)    a. Dx age 42;  b. Most recent A1c 6.3.    Current Outpatient Medications  Medication Sig Dispense Refill  . aspirin EC 325 MG EC tablet Take 1 tablet (325 mg total) by mouth daily. 30 tablet 0  . insulin aspart (NOVOLOG) 100 UNIT/ML injection Inject 4-13 Units into the skin 3 (three) times daily before meals.  Sliding Scale depending on food that is consumed.    Marland Kitchen. levothyroxine (SYNTHROID, LEVOTHROID) 175 MCG tablet Take 175 mcg by mouth daily.  0  . metoprolol tartrate (LOPRESSOR) 25 MG tablet Take 1.5 tablets (37.5 mg total) by mouth 2 (two) times daily. 60 tablet 1  . rosuvastatin (CRESTOR) 20 MG tablet Take 1 tablet (20 mg total) by mouth every evening. 30 tablet 3  . TRESIBA FLEXTOUCH 100 UNIT/ML SOPN FlexTouch Pen Inject 40 Units into the skin daily.  0   No current facility-administered medications for this visit.     Physical Exam BP 133/81 (BP Location: Right Arm, Patient Position: Sitting, Cuff Size: Large)   Pulse 85   Resp 16   Ht 5\' 9"  (1.753 m)   Wt 187 lb 12.8 oz (85.2 kg)   SpO2 99% Comment: RA  BMI 27.3173 kg/m  42 year old man in no acute distress Alert and oriented x3 with no focal deficits Lungs clear with equal breath sounds bilaterally Sternum stable, incision healing well Left arm incision with some eschar formation and minimal erythema Leg incision healing well, no peripheral edema  Diagnostic Tests: CLINICAL DATA:  Post CABG  EXAM: CHEST - 2 VIEW  COMPARISON:  10/04/2017  FINDINGS: Prior CABG. Heart and mediastinal contours are  within normal limits. No focal opacities or effusions. No acute bony abnormality. No pneumothorax.  IMPRESSION: CABG.  No active cardiopulmonary disease.   Electronically Signed   By: Charlett Nose M.D.   On: 11/06/2017 11:49 I personally reviewed the chest x-ray concur with the findings noted above  Impression: Mr. Zuckerman is a 42 year old man who presented with an unstable coronary syndrome.  He had severe three-vessel coronary disease and underwent coronary bypass grafting x5 on 10/01/2017.  His postoperative course was uncomplicated and he went home on day 4.  He is doing well at this point in time.  He has been driving without any problems.  He has some minor incisional discomfort but has not required narcotics.  There  are no longer any restrictions on his activities, but he was urged to gradually build into new activities.  His work is of a physical nature.  I think he needs another 6weeks before he returns to work.  He saw Joni Reining at [facility] cardiology.  He will follow-up with cardiology in [place] long-term.  Plan: I will be happy to see Mr. Deblasi back again at any time in the future if I can be of any further assistance with his care.  Loreli Slot, MD [facility] [phone]  Looking back at his CXR reports the initial CXR recommended a CT chest to r/o a left lower lobe lung nodule. The subsequent reports make no mention of it. To be on the safe side we will get a noncontrast CT chest.  Viviann Spare C. Dorris Fetch, MD [facility] [phone]

## 2017-11-08 ENCOUNTER — Ambulatory Visit
Admission: RE | Admit: 2017-11-08 | Discharge: 2017-11-08 | Disposition: A | Discharge status: Home / Self Care | Payer: BLUE CROSS/BLUE SHIELD | Source: Ambulatory Visit | Attending: Thoracic Surgery (Cardiothoracic Vascular Surgery) | Admitting: Thoracic Surgery (Cardiothoracic Vascular Surgery)

## 2017-11-08 DIAGNOSIS — R911 Solitary pulmonary nodule: Secondary | ICD-10-CM

## 2017-11-08 DIAGNOSIS — R918 Other nonspecific abnormal finding of lung field: Secondary | ICD-10-CM

## 2017-11-12 ENCOUNTER — Telehealth: Payer: Self-pay | Admitting: Thoracic Surgery (Cardiothoracic Vascular Surgery)

## 2017-11-12 NOTE — Telephone Encounter (Signed)
      [address]       [place] [id number]             [phone]      Called with CT result  Nodule was a calcified granuloma- benign, no follow up necessary. No suspicious nodules  Salvatore DecentSteven C. Dorris FetchHendrickson, MD [facility] [phone]

## 2017-11-19 ENCOUNTER — Encounter: Payer: Self-pay | Admitting: Cardiology

## 2017-11-19 ENCOUNTER — Ambulatory Visit (INDEPENDENT_AMBULATORY_CARE_PROVIDER_SITE_OTHER): Payer: BLUE CROSS/BLUE SHIELD | Admitting: Cardiology

## 2017-11-19 VITALS — BP 120/62 | HR 72 | Ht 69.0 in | Wt 188.0 lb

## 2017-11-19 DIAGNOSIS — I2510 Atherosclerotic heart disease of native coronary artery without angina pectoris: Secondary | ICD-10-CM | POA: Diagnosis not present

## 2017-11-19 DIAGNOSIS — E785 Hyperlipidemia, unspecified: Secondary | ICD-10-CM | POA: Diagnosis not present

## 2017-11-19 DIAGNOSIS — E781 Pure hyperglyceridemia: Secondary | ICD-10-CM

## 2017-11-19 DIAGNOSIS — Z951 Presence of aortocoronary bypass graft: Secondary | ICD-10-CM | POA: Diagnosis not present

## 2017-11-19 DIAGNOSIS — R079 Chest pain, unspecified: Secondary | ICD-10-CM

## 2017-11-19 DIAGNOSIS — E1059 Type 1 diabetes mellitus with other circulatory complications: Secondary | ICD-10-CM

## 2017-11-19 NOTE — Progress Notes (Signed)
Cardiology Office Note:    Date:  11/19/2017   ID:  Louis Byrd, DOB 09/19/1975, MRN [id number]  PCP:  Dema SeverinYork, Regina F, NP  Cardiologist:  Gypsy Balsamobert Krasowski, MD    Referring MD: Dema SeverinYork, Regina F, NP   Chief Complaint  Patient presents with  . 1 month follow up  Doing well  History of Present Illness:    Louis Byrd is a 42 y.o. male with long-term type 1 diabetes, recent coronary artery bypass graft with 5 grafts.  Comes to our office for follow-up overall doing well.  He said that he feels tremendously better as compared to before.  On my questioning for how long he has been feeling sick before bypass surgery said for about a year he did not have much chest pain except the ended just before being admitted to the hospital.  His chief complaint was profound fatigue tiredness.  He blames his hypothyroidism for it.  Obviously now after bypass surgery he feels dramatically better.  We spent Gradle of time talking about his problem I described cardiac catheterization to him the need for grafting I we explained all medications and discussed those.  He is doing very well.  He is getting ready to sign up with rehab which I think will be beneficial for him and I strongly recommended for him to do it.  Past Medical History:  Diagnosis Date  . Arthritis   . Bursitis of right hip   . Chest pain    a. 1/2/019 Ex [facility]): Ex time 8:00 No ecg changes. Large severe apical rev defect and small mod sev apicoseptal rev defect. EF 42%.  . Coronary artery disease   . Hyperlipidemia   . Hypothyroidism   . Palpitations    a. premature atrial contractions.  . Pulmonary nodule    a. 08/2017 [facility]: "pulm nodule posterior lung base on lateral film, rec noncontrast chest CT for further eval.  . Seizures (HCC)    "only related to low blood sugars" (09/25/2017)  . Type I diabetes mellitus (HCC)    a. Dx age 42;  b. Most recent A1c 6.3.    Past Surgical History:  Procedure Laterality Date  .  CORONARY ARTERY BYPASS GRAFT N/A 10/01/2017   Procedure: CORONARY ARTERY BYPASS GRAFTING (CABG) x5 using the left internal mammary artery, the right greater saphenous vein harvested endoscopically, and the left radial artery.;  Surgeon: Loreli SlotHendrickson, Steven C, MD;  [facility];  Service: Open Heart Surgery;  Laterality: N/A;  . I and D of groin  2017   for MRSA infection  . KNEE ARTHROSCOPY Right 1990s  . LEFT HEART CATH AND CORONARY ANGIOGRAPHY N/A 09/26/2017   Procedure: LEFT HEART CATH AND CORONARY ANGIOGRAPHY;  Surgeon: Tonny Bollmanooper, Michael, MD;  [facility];  Service: Cardiovascular;  Laterality: N/A;  . RADIAL ARTERY HARVEST Left 10/01/2017   Procedure: RADIAL ARTERY HARVEST;  Surgeon: Loreli SlotHendrickson, Steven C, MD;  [facility];  Service: Open Heart Surgery;  Laterality: Left;  . TEE WITHOUT CARDIOVERSION N/A 10/01/2017   Procedure: TRANSESOPHAGEAL ECHOCARDIOGRAM (TEE);  Surgeon: Loreli SlotHendrickson, Steven C, MD;  [facility];  Service: Open Heart Surgery;  Laterality: N/A;    Current Medications: Current Meds  Medication Sig  . aspirin EC 325 MG EC tablet Take 1 tablet (325 mg total) by mouth daily.  . insulin aspart (NOVOLOG) 100 UNIT/ML injection Inject 4-13 Units into the skin 3 (three) times daily before meals. Sliding Scale  depending on food that is consumed.  Marland Kitchen levothyroxine (SYNTHROID, LEVOTHROID) 175 MCG tablet Take 175 mcg by mouth daily.  . metoprolol tartrate (LOPRESSOR) 25 MG tablet Take 1.5 tablets (37.5 mg total) by mouth 2 (two) times daily.  . rosuvastatin (CRESTOR) 20 MG tablet Take 1 tablet (20 mg total) by mouth every evening.  . TRESIBA FLEXTOUCH 100 UNIT/ML SOPN FlexTouch Pen Inject 40 Units into the skin daily.     Allergies:   Patient has no known allergies.   Social History   Socioeconomic History  . Marital status: Married    Spouse name: Not on file  . Number of children: Not on file  . Years of education: Not on file  . Highest education  level: Not on file  Occupational History  . Occupation: Armed forces training and education officer    Comment: does some heavy exertion.  Social Needs  . Financial resource strain: Not on file  . Food insecurity:    Worry: Not on file    Inability: Not on file  . Transportation needs:    Medical: Not on file    Non-medical: Not on file  Tobacco Use  . Smoking status: Never Smoker  . Smokeless tobacco: Current User    Types: Chew, Snuff  Substance and Sexual Activity  . Alcohol use: Yes    Alcohol/week: 16.8 oz    Types: 28 Cans of beer per week    Frequency: Never    Comment: 09/25/2017 "2, 25oz beers/night" - has stopped as of  09/24/17  . Drug use: No  . Sexual activity: Not on file  Lifestyle  . Physical activity:    Days per week: Not on file    Minutes per session: Not on file  . Stress: Not on file  Relationships  . Social connections:    Talks on phone: Not on file    Gets together: Not on file    Attends religious service: Not on file    Active member of club or organization: Not on file    Attends meetings of clubs or organizations: Not on file    Relationship status: Not on file  Other Topics Concern  . Not on file  Social History Narrative   Lives in [place] with his wife and their children.     Family History: The patient's family history includes Diabetes Mellitus I in his brother; Heart attack in his father. ROS:   Please see the history of present illness.    All 14 point review of systems negative except as described per history of present illness  EKGs/Labs/Other Studies Reviewed:      Recent Labs: 10/01/2017: ALT 33 10/02/2017: Magnesium 2.2 10/04/2017: BUN 5; Creatinine, Ser 0.76; Hemoglobin 11.8; Platelets 167; Potassium 3.9; Sodium 137  Recent Lipid Panel No results found for: CHOL, TRIG, HDL, CHOLHDL, VLDL, LDLCALC, LDLDIRECT  Physical Exam:    VS:  BP 120/62   Pulse 72   Ht 5\' 9"  (1.753 m)   Wt 188 lb (85.3 kg)   SpO2 97%   BMI 27.76 kg/m     Wt  Readings from Last 3 Encounters:  11/19/17 188 lb (85.3 kg)  11/06/17 187 lb 12.8 oz (85.2 kg)  10/18/17 185 lb 3.2 oz (84 kg)     GEN:  Well nourished, well developed in no acute distress HEENT: Normal NECK: No JVD; No carotid bruits LYMPHATICS: No lymphadenopathy CARDIAC: RRR, no murmurs, no rubs, no gallops RESPIRATORY:  Clear to auscultation without rales, wheezing or rhonchi  ABDOMEN: Soft, non-tender, non-distended MUSCULOSKELETAL:  No edema; No deformity  SKIN: Warm and dry LOWER EXTREMITIES: no swelling NEUROLOGIC:  Alert and oriented x 3 PSYCHIATRIC:  Normal affect   ASSESSMENT:    1. S/P CABG x 5   2. Coronary artery disease involving native coronary artery of native heart without angina pectoris   3. Pure hyperglyceridemia   4. Type 1 diabetes mellitus with other circulatory complication (HCC)   5. Hyperlipidemia, unspecified hyperlipidemia type   6. Chest pain, unspecified type    PLAN:    In order of problems listed above:  1. Status post coronary artery bypass graft wounds are healed completely.  This is almost 2 months after his bypass surgery.  We did review all medications in my opinion he needs to be on ACE inhibitor this for I will check his Chem-7 today if Chem-7 is acceptable we will start ACE inhibitor.  He is already on beta-blocker which I will continue. 2. Dyslipidemia: He is on Crestor 20 I will check his fasting lipid profile 3. #1 diabetes.  He may benefit from endocrinologist which we already started talking about. 4. Chest pain denies having any all the soreness after bypass surgery.   Medication Adjustments/Labs and Tests Ordered: Current medicines are reviewed at length with the patient today.  Concerns regarding medicines are outlined above.  Orders Placed This Encounter  Procedures  . Basic Metabolic Panel (BMET)  . Lipid Profile  . EKG 12-Lead   Medication changes: No orders of the defined types were placed in this  encounter.   Signed, Georgeanna Lea, MD, [facility] 11/19/2017 12:17 PM    [facility]

## 2017-11-19 NOTE — Patient Instructions (Signed)
Medication Instructions:  Your physician recommends that you continue on your current medications as directed. Please refer to the Current Medication list given to you today.   Labwork: Your physician recommends that you return for lab work today: lipid panel, BMP.   Testing/Procedures: You had an EKG today.   Follow-Up: Your physician recommends that you schedule a follow-up appointment in: 2 months.   Any Other Special Instructions Will Be Listed Below (If Applicable).     If you need a refill on your cardiac medications before your next appointment, please call your pharmacy.

## 2017-11-20 ENCOUNTER — Telehealth: Payer: Self-pay | Admitting: *Deleted

## 2017-11-20 DIAGNOSIS — E785 Hyperlipidemia, unspecified: Secondary | ICD-10-CM

## 2017-11-20 DIAGNOSIS — I2510 Atherosclerotic heart disease of native coronary artery without angina pectoris: Secondary | ICD-10-CM

## 2017-11-20 DIAGNOSIS — E119 Type 2 diabetes mellitus without complications: Secondary | ICD-10-CM

## 2017-11-20 LAB — BASIC METABOLIC PANEL
BUN / CREAT RATIO: 12 (ref 9–20)
BUN: 10 mg/dL (ref 6–24)
CALCIUM: 9.7 mg/dL (ref 8.7–10.2)
CHLORIDE: 99 mmol/L (ref 96–106)
CO2: 24 mmol/L (ref 20–29)
CREATININE: 0.82 mg/dL (ref 0.76–1.27)
GFR calc Af Amer: 127 mL/min/{1.73_m2} (ref >59)
GFR calc non Af Amer: 110 mL/min/{1.73_m2} (ref >59)
GLUCOSE: 129 mg/dL — AB (ref 65–99)
Potassium: 4.5 mmol/L (ref 3.5–5.2)
Sodium: 141 mmol/L (ref 134–144)

## 2017-11-20 LAB — LIPID PANEL
CHOLESTEROL TOTAL: 190 mg/dL (ref 100–199)
Chol/HDL Ratio: 2.5 ratio (ref 0.0–5.0)
HDL: 75 mg/dL (ref >39)
LDL CALC: 82 mg/dL (ref 0–99)
TRIGLYCERIDES: 164 mg/dL — AB (ref 0–149)
VLDL Cholesterol Cal: 33 mg/dL (ref 5–40)

## 2017-11-20 MED ORDER — ROSUVASTATIN CALCIUM 40 MG PO TABS: 40.0000 mg | ORAL_TABLET | Freq: Every evening | ORAL | 2 refills | Status: DC

## 2017-11-20 NOTE — Telephone Encounter (Signed)
Patient informed of results. Patient advised to increase crestor from 20 mg to 40 mg daily. Refill sent to pharmacy. Advised to go to [facility] in [place] or come back to our office for repeat lab work the week of 01/01/2018. Patient verbalized understanding. No further questions.

## 2017-11-20 NOTE — Telephone Encounter (Signed)
-----   Message from Georgeanna Leaobert J Krasowski, MD sent at 11/20/2017  1:52 PM EDT ----- Increase crestor to 40 mg po qd  flp ast alt in 6 weeks

## 2017-12-10 ENCOUNTER — Telehealth: Payer: Self-pay | Admitting: Cardiology

## 2017-12-10 MED ORDER — METOPROLOL TARTRATE 25 MG PO TABS: 37.5000 mg | ORAL_TABLET | Freq: Two times a day (BID) | ORAL | 3 refills | Status: DC

## 2017-12-10 NOTE — Telephone Encounter (Signed)
Refill sent.

## 2017-12-10 NOTE — Telephone Encounter (Signed)
°*  STAT* If patient is at the pharmacy, call can be transferred to refill team.   1. Which medications need to be refilled? (please list name of each medication and dose if known) Metoprolol  takes 1 1/2 2 times daily  2. Which pharmacy/location (including street and city if local pharmacy) is medication to be sent to? [facility]  3. Do they need a 30 day or 90 day supply? 90

## 2017-12-19 ENCOUNTER — Telehealth: Payer: Self-pay | Admitting: Cardiology

## 2017-12-19 ENCOUNTER — Encounter: Payer: Self-pay | Admitting: *Deleted

## 2017-12-19 NOTE — Telephone Encounter (Signed)
Dr. Bing Matter advised to call and check with surgeon who performed CABG in 09/2017, Dr. Dorris Fetch, to make sure from his point of view, patient is able to go back to work on 12/31/2017. Darl Pikes, Dr. Sunday Corn nurse advised patient is cleared to go back to work on 12/31/2017 with no restrictions. Dr. Bing Matter in agreement. Called to inform patient of this information and sent him a clearance letter in the mail. No further questions.

## 2017-12-19 NOTE — Telephone Encounter (Signed)
New message:       Pt is needing a note to return back to work on 12/31/17

## 2018-01-02 ENCOUNTER — Encounter: Payer: Self-pay | Admitting: Cardiology

## 2018-01-03 ENCOUNTER — Telehealth: Payer: Self-pay | Admitting: *Deleted

## 2018-01-03 NOTE — Telephone Encounter (Signed)
Patient states after returning to work on Monday he pulled a muscle in his chest on the left side and his chest is swollen every now and then. There is a nurse at his work who assessed him when this happened and his blood pressure was elevated at 140/95. Nurse advised him to go to [facility] clinic where he had an EKG done and then was sent to [facility] for chest xray. Patient asking for a note stating Dr. 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 Dr. Bing Matter advised to see patient in office before making any decisions. Patient's follow up appointment moved to tomorrow 01/03/2018 at 11:00 am. No further questions.

## 2018-01-04 ENCOUNTER — Ambulatory Visit (INDEPENDENT_AMBULATORY_CARE_PROVIDER_SITE_OTHER): Payer: BLUE CROSS/BLUE SHIELD | Admitting: Cardiology

## 2018-01-04 ENCOUNTER — Encounter: Payer: Self-pay | Admitting: *Deleted

## 2018-01-04 ENCOUNTER — Encounter: Payer: Self-pay | Admitting: Cardiology

## 2018-01-04 VITALS — BP 120/70 | HR 89 | Ht 69.0 in | Wt 196.0 lb

## 2018-01-04 DIAGNOSIS — I2510 Atherosclerotic heart disease of native coronary artery without angina pectoris: Secondary | ICD-10-CM

## 2018-01-04 DIAGNOSIS — E781 Pure hyperglyceridemia: Secondary | ICD-10-CM

## 2018-01-04 DIAGNOSIS — Z951 Presence of aortocoronary bypass graft: Secondary | ICD-10-CM | POA: Diagnosis not present

## 2018-01-04 MED ORDER — ASPIRIN EC 81 MG PO TBEC: 81.0000 mg | DELAYED_RELEASE_TABLET | Freq: Every day | ORAL | 3 refills | Status: DC

## 2018-01-04 MED ORDER — LISINOPRIL 5 MG PO TABS: 5.0000 mg | ORAL_TABLET | Freq: Every day | ORAL | 6 refills | Status: DC

## 2018-01-04 NOTE — Patient Instructions (Signed)
Medication Instructions:  Your physician has recommended you make the following CHANGE in your medication: Lisinopril 5 mg daily and aspirin 81 mg daily    Labwork: Your physician recommends that you return for lab work in a week : BMP which he can do at [facility]    Testing/Procedures: None  Follow-Up: Your physician recommends that you schedule a follow-up appointment in: 1 month   Any Other Special Instructions Will Be Listed Below (If Applicable).     If you need a refill on your cardiac medications before your next appointment, please call your pharmacy.

## 2018-01-04 NOTE — Progress Notes (Signed)
Cardiology Office Note:    Date:  01/04/2018   ID:  Louis Byrd, DOB 1975/12/22, MRN [id number]  PCP:  Dema Severin, NP  Cardiologist:  Gypsy Balsam, MD    Referring MD: Dema Severin, NP   Chief Complaint  Patient presents with  . Follow-up  Have swelling my chest  History of Present Illness:    Louis Byrd is a 42 y.o. male with coronary artery disease status post recent coronary artery bypass graft.  He is also diabetic and does have dyslipidemia about a week ago he ended up noticing swelling in the left side of his chest.  He went to his nurse at work she sent him to primary care physician EKG was done which was normal chest x-ray was done which was normal and he was referred back to [place].  On the physical exam I see minimal if any swelling there is some minimal tenderness in the area but otherwise things are looking good overall he is feeling well denies have any chest pain tightness squeezing pressure burning chest.  Past Medical History:  Diagnosis Date  . Arthritis   . Bursitis of right hip   . Chest pain    a. 1/2/019 Ex MV [facility]): Ex time 8:00 No ecg changes. Large severe apical rev defect and small mod sev apicoseptal rev defect. EF 42%.  . Coronary artery disease   . Hyperlipidemia   . Hypothyroidism   . Palpitations    a. premature atrial contractions.  . Pulmonary nodule    a. 08/2017 CXR - [facility]: "pulm nodule posterior lung base on lateral film, rec noncontrast chest CT for further eval.  . Seizures (HCC)    "only related to low blood sugars" (09/25/2017)  . Type I diabetes mellitus (HCC)    a. Dx age 86;  b. Most recent A1c 6.3.    Past Surgical History:  Procedure Laterality Date  . CORONARY ARTERY BYPASS GRAFT N/A 10/01/2017   Procedure: CORONARY ARTERY BYPASS GRAFTING (CABG) x5 using the left internal mammary artery, the right greater saphenous vein harvested endoscopically, and the left radial artery.;  Surgeon: Loreli Slot, MD;   Location: [facility] OR;  Service: Open Heart Surgery;  Laterality: N/A;  . I and D of groin  2017   for MRSA infection  . KNEE ARTHROSCOPY Right 1990s  . LEFT HEART CATH AND CORONARY ANGIOGRAPHY N/A 09/26/2017   Procedure: LEFT HEART CATH AND CORONARY ANGIOGRAPHY;  Surgeon: Tonny Bollman, MD;  Location: [facility] INVASIVE CV LAB;  Service: Cardiovascular;  Laterality: N/A;  . RADIAL ARTERY HARVEST Left 10/01/2017   Procedure: RADIAL ARTERY HARVEST;  Surgeon: Loreli Slot, MD;  Location: [facility] OR;  Service: Open Heart Surgery;  Laterality: Left;  . TEE WITHOUT CARDIOVERSION N/A 10/01/2017   Procedure: TRANSESOPHAGEAL ECHOCARDIOGRAM (TEE);  Surgeon: Loreli Slot, MD;  Location: [facility] OR;  Service: Open Heart Surgery;  Laterality: N/A;    Current Medications: Current Meds  Medication Sig  . aspirin EC 325 MG EC tablet Take 1 tablet (325 mg total) by mouth daily.  . insulin aspart (NOVOLOG) 100 UNIT/ML injection Inject 4-13 Units into the skin 3 (three) times daily before meals. Sliding Scale depending on food that is consumed.  Marland Kitchen levothyroxine (SYNTHROID, LEVOTHROID) 175 MCG tablet Take 175 mcg by mouth daily.  . metoprolol tartrate (LOPRESSOR) 25 MG tablet Take 1.5 tablets (37.5 mg total) by mouth 2 (two) times daily.  . rosuvastatin (CRESTOR) 40 MG tablet Take  1 tablet (40 mg total) by mouth every evening.  . TRESIBA FLEXTOUCH 100 UNIT/ML SOPN FlexTouch Pen Inject 40 Units into the skin daily.     Allergies:   Patient has no known allergies.   Social History   Socioeconomic History  . Marital status: Married    Spouse name: Not on file  . Number of children: Not on file  . Years of education: Not on file  . Highest education level: Not on file  Occupational History  . Occupation: Armed forces training and education officer    Comment: does some heavy exertion.  Social Needs  . Financial resource strain: Not on file  . Food insecurity:    Worry: Not on file    Inability: Not on file  . Transportation  needs:    Medical: Not on file    Non-medical: Not on file  Tobacco Use  . Smoking status: Never Smoker  . Smokeless tobacco: Current User    Types: Chew, Snuff  Substance and Sexual Activity  . Alcohol use: Yes    Alcohol/week: 16.8 oz    Types: 28 Cans of beer per week    Frequency: Never    Comment: 09/25/2017 "2, 25oz beers/night" - has stopped as of  09/24/17  . Drug use: No  . Sexual activity: Not on file  Lifestyle  . Physical activity:    Days per week: Not on file    Minutes per session: Not on file  . Stress: Not on file  Relationships  . Social connections:    Talks on phone: Not on file    Gets together: Not on file    Attends religious service: Not on file    Active member of club or organization: Not on file    Attends meetings of clubs or organizations: Not on file    Relationship status: Not on file  Other Topics Concern  . Not on file  Social History Narrative   Lives in [place] with his wife and their children.     Family History: The patient's family history includes Diabetes Mellitus I in his brother; Heart attack in his father. ROS:   Please see the history of present illness.    All 14 point review of systems negative except as described per history of present illness  EKGs/Labs/Other Studies Reviewed:      Recent Labs: 10/01/2017: ALT 33 10/02/2017: Magnesium 2.2 10/04/2017: Hemoglobin 11.8; Platelets 167 11/19/2017: BUN 10; Creatinine, Ser 0.82; Potassium 4.5; Sodium 141  Recent Lipid Panel    Component Value Date/Time   CHOL 190 11/19/2017 1215   TRIG 164 (H) 11/19/2017 1215   HDL 75 11/19/2017 1215   CHOLHDL 2.5 11/19/2017 1215   LDLCALC 82 11/19/2017 1215    Physical Exam:    VS:  BP 120/70   Pulse 89   Ht  (1.753 m)   Wt 196 lb (88.9 kg)   SpO2 95%   BMI 28.94 kg/m     Wt Readings from Last 3 Encounters:  01/04/18 196 lb (88.9 kg)  11/19/17 188 lb (85.3 kg)  11/06/17 187 lb 12.8 oz (85.2 kg)     GEN:  Well  nourished, well developed in no acute distress HEENT: Normal NECK: No JVD; No carotid bruits LYMPHATICS: No lymphadenopathy CARDIAC: RRR, no murmurs, no rubs, no gallops RESPIRATORY:  Clear to auscultation without rales, wheezing or rhonchi  ABDOMEN: Soft, non-tender, non-distended MUSCULOSKELETAL:  No edema; No deformity  SKIN: Warm and dry LOWER EXTREMITIES: no swelling NEUROLOGIC:  Alert and oriented x 3 PSYCHIATRIC:  Normal affect   ASSESSMENT:    No diagnosis found. PLAN:    In order of problems listed above:  1. Coronary artery disease: Stable doing well without any typical angina pectoris.  Continue present management 2. Swelling in the left upper portion of the chest only minimal with minimal tenderness.  He works physically 12 hours every single day I spoke to his nurse at work.  Will reduce amount of work that he will do.  We will go for 2 weeks to 6 hours a day.  I told him if this sensation recur or become worse he need to let me know right away.  He does have 2 days of right now therefore he will have some time to recover. 3. Dyslipidemia: High intensity statin which I will continue. 4. We need to restart him on ACE inhibitor.  I gave him 5 mg tablets.  His last kidney function was normal we will check a 7 in a week.   Medication Adjustments/Labs and Tests Ordered: Current medicines are reviewed at length with the patient today.  Concerns regarding medicines are outlined above.  No orders of the defined types were placed in this encounter.  Medication changes: No orders of the defined types were placed in this encounter.   Signed, Georgeanna Lea, MD, [facility] 01/04/2018 11:21 AM    [facility]

## 2018-01-18 ENCOUNTER — Ambulatory Visit: Payer: BLUE CROSS/BLUE SHIELD | Admitting: Cardiology

## 2018-01-21 ENCOUNTER — Emergency Department (HOSPITAL_COMMUNITY)
Admission: EM | Admit: 2018-01-21 | Discharge: 2018-01-21 | Disposition: A | Discharge status: Home / Self Care | Payer: BLUE CROSS/BLUE SHIELD | Attending: Emergency Medicine | Admitting: Emergency Medicine

## 2018-01-21 ENCOUNTER — Emergency Department (HOSPITAL_COMMUNITY): Payer: BLUE CROSS/BLUE SHIELD

## 2018-01-21 ENCOUNTER — Encounter (HOSPITAL_COMMUNITY): Payer: Self-pay | Admitting: *Deleted

## 2018-01-21 ENCOUNTER — Telehealth: Payer: Self-pay | Admitting: Cardiology

## 2018-01-21 ENCOUNTER — Other Ambulatory Visit: Payer: Self-pay

## 2018-01-21 DIAGNOSIS — R030 Elevated blood-pressure reading, without diagnosis of hypertension: Secondary | ICD-10-CM | POA: Insufficient documentation

## 2018-01-21 DIAGNOSIS — Z5321 Procedure and treatment not carried out due to patient leaving prior to being seen by health care provider: Secondary | ICD-10-CM | POA: Insufficient documentation

## 2018-01-21 LAB — BASIC METABOLIC PANEL
Anion gap: 11 (ref 5–15)
BUN: 8 mg/dL (ref 6–20)
CALCIUM: 9.2 mg/dL (ref 8.9–10.3)
CO2: 25 mmol/L (ref 22–32)
CREATININE: 0.82 mg/dL (ref 0.61–1.24)
Chloride: 104 mmol/L (ref 101–111)
GFR calc non Af Amer: >60 mL/min (ref >60)
GLUCOSE: 94 mg/dL (ref 65–99)
Potassium: 3.8 mmol/L (ref 3.5–5.1)
Sodium: 140 mmol/L (ref 135–145)

## 2018-01-21 LAB — I-STAT TROPONIN, ED: TROPONIN I, POC: 0 ng/mL (ref 0.00–0.08)

## 2018-01-21 LAB — CBC
HCT: 47.4 % (ref 39.0–52.0)
Hemoglobin: 15.9 g/dL (ref 13.0–17.0)
MCH: 27.7 pg (ref 26.0–34.0)
MCHC: 33.5 g/dL (ref 30.0–36.0)
MCV: 82.7 fL (ref 78.0–100.0)
PLATELETS: 305 10*3/uL (ref 150–400)
RBC: 5.73 MIL/uL (ref 4.22–5.81)
RDW: 13.7 % (ref 11.5–15.5)
WBC: 8 10*3/uL (ref 4.0–10.5)

## 2018-01-21 NOTE — ED Triage Notes (Signed)
Pt presents with c/o elevated BP. Went to UC in [place] today and BP was 153/97 and within 30 minutes it was back to normal. Episode today where he felt "uncomfortable" in his chest,  nausea and lightheadedness lasting about 40 minutes. Hx CABG in February.

## 2018-01-21 NOTE — Telephone Encounter (Signed)
Patient BP is fluctuating a lot, went to  Urgent care and needs a call back. EKG was normal

## 2018-01-21 NOTE — Telephone Encounter (Signed)
Patient's blood pressure went up: 153-97. Patient felt a little lightheaded, sick to his stomach, and his face was hot. Patient was not doing anything strenuous at the time.  Nurse made him go to Urgent Care and get EKG done. Doctor wanted him to go to [facility] and have some blood work drawn: troponin. The patient wanted to know if he did in fact need to go get this done or if her could see Dr. Bing MatterKrasowski. Advised patient that Dr. Bing MatterKrasowski is out of the office this week and that if the physician at Urgent Care felt he needed to have this lab checked then he should do so. Patient verbalized understanding and will have go to [facility] for evaluation. Patient does have a copy of the EKG done at Urgent Care to show the [facility] physician. No further questions.

## 2018-01-22 MED ORDER — CARVEDILOL 12.5 MG PO TABS: 12.5000 mg | ORAL_TABLET | Freq: Two times a day (BID) | ORAL | 11 refills | Status: DC

## 2018-01-22 NOTE — Telephone Encounter (Signed)
Patient advised to stop metoprolol and start carvedilol 12.5 mg twice daily. Appointment scheduled for 01/29/18 at 8 am to see Dr. Bing MatterKrasowski. Patient verbalized understanding of instructions.  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 Dr. Bing MatterKrasowski wrote for the patient to work for 6 hours for 2 weeks and then resume regular work schedule, which is 3, 12 hour shifts. Discussed with Dr. Dulce SellarMunley who agreed and wrote for patient to work 8 hour shift for one week, 10 hour shifts for one week, then may resume regular work schedule. Patient is coming by the office today to pick this letter up. No further questions.

## 2018-01-22 NOTE — Addendum Note (Signed)
Addended by: Ayesha MohairWELLS, MICHAELA E on: 01/22/2018 09:57 AM   Modules accepted: Orders

## 2018-01-22 NOTE — ED Notes (Signed)
Follow up call made  Left LWBS so he could go to work  Has a call in to his md to be seen  01/22/18  0918  s Aiva Miskell rn

## 2018-01-22 NOTE — Telephone Encounter (Signed)
Dc metoprolol  Carvedilol 12.5 BID new  See RJK next week

## 2018-01-22 NOTE — Telephone Encounter (Signed)
Patient went to the emergency department last night and troponin was negative. Blood pressure at work this morning 140/100. He went home and checked blood pressure and was 140/103.   Patient has been taking lisinopril 5 mg daily and metoprolol tartrate 37.5 mg twice daily.   Patient is concerned with diastolic number. Please advise.

## 2018-01-28 LAB — BASIC METABOLIC PANEL WITH GFR
BUN/Creatinine Ratio: 15 (ref 9–20)
BUN: 12 mg/dL (ref 6–24)
CO2: 26 mmol/L (ref 20–29)
Calcium: 9.6 mg/dL (ref 8.7–10.2)
Chloride: 99 mmol/L (ref 96–106)
Creatinine, Ser: 0.8 mg/dL (ref 0.76–1.27)
GFR calc Af Amer: 128 mL/min/1.73
GFR calc non Af Amer: 111 mL/min/1.73
Glucose: 150 mg/dL — ABNORMAL HIGH (ref 65–99)
Potassium: 4.3 mmol/L (ref 3.5–5.2)
Sodium: 137 mmol/L (ref 134–144)

## 2018-01-29 ENCOUNTER — Encounter: Payer: Self-pay | Admitting: Cardiology

## 2018-01-29 ENCOUNTER — Ambulatory Visit (INDEPENDENT_AMBULATORY_CARE_PROVIDER_SITE_OTHER): Payer: BLUE CROSS/BLUE SHIELD | Admitting: Cardiology

## 2018-01-29 ENCOUNTER — Encounter: Payer: Self-pay | Admitting: *Deleted

## 2018-01-29 VITALS — BP 120/80 | HR 82 | Ht 69.0 in | Wt 195.4 lb

## 2018-01-29 DIAGNOSIS — E1159 Type 2 diabetes mellitus with other circulatory complications: Secondary | ICD-10-CM | POA: Insufficient documentation

## 2018-01-29 DIAGNOSIS — Z951 Presence of aortocoronary bypass graft: Secondary | ICD-10-CM | POA: Diagnosis not present

## 2018-01-29 DIAGNOSIS — E781 Pure hyperglyceridemia: Secondary | ICD-10-CM | POA: Diagnosis not present

## 2018-01-29 DIAGNOSIS — I2510 Atherosclerotic heart disease of native coronary artery without angina pectoris: Secondary | ICD-10-CM | POA: Diagnosis not present

## 2018-01-29 DIAGNOSIS — I152 Hypertension secondary to endocrine disorders: Secondary | ICD-10-CM | POA: Insufficient documentation

## 2018-01-29 DIAGNOSIS — I10 Essential (primary) hypertension: Secondary | ICD-10-CM | POA: Diagnosis not present

## 2018-01-29 DIAGNOSIS — E1059 Type 1 diabetes mellitus with other circulatory complications: Secondary | ICD-10-CM

## 2018-01-29 HISTORY — DX: Essential (primary) hypertension: I10

## 2018-01-29 MED ORDER — LISINOPRIL 10 MG PO TABS: 10.0000 mg | ORAL_TABLET | Freq: Every day | ORAL | 6 refills | Status: DC

## 2018-01-29 NOTE — Patient Instructions (Signed)
Medication Instructions:  Your physician has recommended you make the following change in your medication:  INCREASE your Lisinopril to 10 mg daily  Labwork: Your physician recommends that you return for lab work in: 3-5 day, BMP, at [facility] in [place]  Today you will have a lipid to check your cholesterol.  Testing/Procedures: None ordered  Follow-Up: Your physician recommends that you schedule a follow-up appointment in: 3 months with Dr. Bing MatterKrasowski   Any Other Special Instructions Will Be Listed Below (If Applicable).     If you need a refill on your cardiac medications before your next appointment, please call your pharmacy.

## 2018-01-29 NOTE — Progress Notes (Signed)
Cardiology Office Note:    Date:  01/29/2018   ID:  Louis Byrd, DOB 05-29-1976, MRN [id number]  PCP:  Dema Severin, NP  Cardiologist:  Gypsy Balsam, MD    Referring MD: Dema Severin, NP   Chief Complaint  Patient presents with  . Discuss blood pressure  Have problem with blood pressure  History of Present Illness:    Louis Byrd is a 42 y.o. male with recent coronary artery bypass graft, diabetes.  Also does have history of hypertension.  Lately he noted his face getting red from time to time his blood pressure will be high.  He works when he get that way he goes to the nurse to check his blood pressure machine see numbers 150/90.  He is kind of alarmed by that.  Otherwise he seems to be doing well.  He can walk climb stairs with no difficulties.  Session sensation that he had on the left side is gone.  His sugar apparently when allowing he became hypoglycemic and had episode of seizures few months ago.  The dose of insulin has been adjusted and now he is fine.  Does not exercise on the regular basis.  Past Medical History:  Diagnosis Date  . Arthritis   . Bursitis of right hip   . Chest pain    a. 1/2/019 Ex MV [facility]): Ex time 8:00 No ecg changes. Large severe apical rev defect and small mod sev apicoseptal rev defect. EF 42%.  . Coronary artery disease   . Hyperlipidemia   . Hypothyroidism   . Palpitations    a. premature atrial contractions.  . Pulmonary nodule    a. 08/2017 CXR - [facility]: "pulm nodule posterior lung base on lateral film, rec noncontrast chest CT for further eval.  . Seizures (HCC)    "only related to low blood sugars" (09/25/2017)  . Type I diabetes mellitus (HCC)    a. Dx age 64;  b. Most recent A1c 6.3.    Past Surgical History:  Procedure Laterality Date  . CORONARY ARTERY BYPASS GRAFT N/A 10/01/2017   Procedure: CORONARY ARTERY BYPASS GRAFTING (CABG) x5 using the left internal mammary artery, the right greater saphenous vein  harvested endoscopically, and the left radial artery.;  Surgeon: Loreli Slot, MD;  Location: [facility] OR;  Service: Open Heart Surgery;  Laterality: N/A;  . I and D of groin  2017   for MRSA infection  . KNEE ARTHROSCOPY Right 1990s  . LEFT HEART CATH AND CORONARY ANGIOGRAPHY N/A 09/26/2017   Procedure: LEFT HEART CATH AND CORONARY ANGIOGRAPHY;  Surgeon: Tonny Bollman, MD;  Location: [facility] INVASIVE CV LAB;  Service: Cardiovascular;  Laterality: N/A;  . RADIAL ARTERY HARVEST Left 10/01/2017   Procedure: RADIAL ARTERY HARVEST;  Surgeon: Loreli Slot, MD;  Location: [facility] OR;  Service: Open Heart Surgery;  Laterality: Left;  . TEE WITHOUT CARDIOVERSION N/A 10/01/2017   Procedure: TRANSESOPHAGEAL ECHOCARDIOGRAM (TEE);  Surgeon: Loreli Slot, MD;  Location: [facility] OR;  Service: Open Heart Surgery;  Laterality: N/A;    Current Medications: Current Meds  Medication Sig  . aspirin EC 81 MG tablet Take 1 tablet (81 mg total) by mouth daily.  . carvedilol (COREG) 12.5 MG tablet Take 1 tablet (12.5 mg total) by mouth 2 (two) times daily.  . insulin aspart (NOVOLOG) 100 UNIT/ML injection Inject 4-13 Units into the skin 3 (three) times daily before meals. Sliding Scale depending on food that is consumed.  Marland Kitchen levothyroxine (  SYNTHROID, LEVOTHROID) 175 MCG tablet Take 175 mcg by mouth daily.  Marland Kitchen lisinopril (PRINIVIL,ZESTRIL) 5 MG tablet Take 1 tablet (5 mg total) by mouth daily.  . rosuvastatin (CRESTOR) 40 MG tablet Take 1 tablet (40 mg total) by mouth every evening.  . TRESIBA FLEXTOUCH 100 UNIT/ML SOPN FlexTouch Pen Inject 40 Units into the skin daily.     Allergies:   Patient has no known allergies.   Social History   Socioeconomic History  . Marital status: Married    Spouse name: Not on file  . Number of children: Not on file  . Years of education: Not on file  . Highest education level: Not on file  Occupational History  . Occupation: Armed forces training and education officer    Comment: does some heavy  exertion.  Social Needs  . Financial resource strain: Not on file  . Food insecurity:    Worry: Not on file    Inability: Not on file  . Transportation needs:    Medical: Not on file    Non-medical: Not on file  Tobacco Use  . Smoking status: Never Smoker  . Smokeless tobacco: Current User    Types: Chew, Snuff  Substance and Sexual Activity  . Alcohol use: Yes    Alcohol/week: 16.8 oz    Types: 28 Cans of beer per week    Frequency: Never    Comment: 09/25/2017 "2, 25oz beers/night" - has stopped as of  09/24/17  . Drug use: No  . Sexual activity: Not on file  Lifestyle  . Physical activity:    Days per week: Not on file    Minutes per session: Not on file  . Stress: Not on file  Relationships  . Social connections:    Talks on phone: Not on file    Gets together: Not on file    Attends religious service: Not on file    Active member of club or organization: Not on file    Attends meetings of clubs or organizations: Not on file    Relationship status: Not on file  Other Topics Concern  . Not on file  Social History Narrative   Lives in [place] with his wife and their children.     Family History: The patient's family history includes Diabetes Mellitus I in his brother; Heart attack in his father. ROS:   Please see the history of present illness.    All 14 point review of systems negative except as described per history of present illness  EKGs/Labs/Other Studies Reviewed:      Recent Labs: 10/01/2017: ALT 33 10/02/2017: Magnesium 2.2 01/21/2018: Hemoglobin 15.9; Platelets 305 01/28/2018: BUN 12; Creatinine, Ser 0.80; Potassium 4.3; Sodium 137  Recent Lipid Panel    Component Value Date/Time   CHOL 190 11/19/2017 1215   TRIG 164 (H) 11/19/2017 1215   HDL 75 11/19/2017 1215   CHOLHDL 2.5 11/19/2017 1215   LDLCALC 82 11/19/2017 1215    Physical Exam:    VS:  BP 120/80   Pulse 82   Ht 5\' 9"  (1.753 m)   Wt 195 lb 6.4 oz (88.6 kg)   SpO2 97%   BMI 28.86  kg/m     Wt Readings from Last 3 Encounters:  01/29/18 195 lb 6.4 oz (88.6 kg)  01/04/18 196 lb (88.9 kg)  11/19/17 188 lb (85.3 kg)     GEN:  Well nourished, well developed in no acute distress HEENT: Normal NECK: No JVD; No carotid bruits LYMPHATICS: No lymphadenopathy CARDIAC: RRR,  no murmurs, no rubs, no gallops RESPIRATORY:  Clear to auscultation without rales, wheezing or rhonchi  ABDOMEN: Soft, non-tender, non-distended MUSCULOSKELETAL:  No edema; No deformity  SKIN: Warm and dry LOWER EXTREMITIES: no swelling NEUROLOGIC:  Alert and oriented x 3 PSYCHIATRIC:  Normal affect   ASSESSMENT:    1. S/P CABG x 5   2. Coronary artery disease involving native coronary artery of native heart without angina pectoris   3. 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 Pure hyperglyceridemia   5. Essential hypertension    PLAN:    In order of problems listed above:  1. Status post coronary artery bypass graft: Recovered doing well. 2. Coronary artery disease doing well from that point we will continue present management. 3. Diabetes: Did have episode of hypoglycemia.  Advised adjusted his insulin doing well. 4. Dyslipidemia: We will check his fasting lipid profile 5. Essential hypertension: He is blood pressure appears to be controlled today.  However with his spike of blood pressure I will ask him to double the dose of lisinopril.  He will check his Chem-7 within next 3 to 5 days.  See him back in my office in about 3 months or sooner if he has a problem.  We discussed the issue of need to exercise on the regular basis which she will try to do.  We will give him a letter excusing him from work for next 2 days.  To get his blood pressure stable.   Medication Adjustments/Labs and Tests Ordered: Current medicines are reviewed at length with the patient today.  Concerns regarding medicines are outlined above.  No orders of the defined types were placed in  this encounter.  Medication changes: No orders of the defined types were placed in this encounter.   Signed, Georgeanna Leaobert J. Lindley Stachnik, MD, [facility] 01/29/2018 8:27 AM    [facility]

## 2018-01-30 ENCOUNTER — Other Ambulatory Visit: Payer: Self-pay

## 2018-01-30 DIAGNOSIS — I2510 Atherosclerotic heart disease of native coronary artery without angina pectoris: Secondary | ICD-10-CM

## 2018-01-30 DIAGNOSIS — E781 Pure hyperglyceridemia: Secondary | ICD-10-CM

## 2018-01-30 LAB — LIPID PANEL
Chol/HDL Ratio: 2.4 ratio (ref 0.0–5.0)
Cholesterol, Total: 158 mg/dL (ref 100–199)
HDL: 66 mg/dL (ref >39)
LDL Calculated: 78 mg/dL (ref 0–99)
Triglycerides: 71 mg/dL (ref 0–149)
VLDL CHOLESTEROL CAL: 14 mg/dL (ref 5–40)

## 2018-01-30 MED ORDER — EZETIMIBE 10 MG PO TABS: 10.0000 mg | ORAL_TABLET | Freq: Every day | ORAL | 2 refills | Status: DC

## 2018-02-04 ENCOUNTER — Ambulatory Visit: Payer: BLUE CROSS/BLUE SHIELD | Admitting: Cardiology

## 2018-02-07 ENCOUNTER — Ambulatory Visit: Payer: BLUE CROSS/BLUE SHIELD | Admitting: Cardiology

## 2018-03-26 ENCOUNTER — Telehealth: Payer: Self-pay

## 2018-03-26 NOTE — Telephone Encounter (Signed)
Left voicemail reminding patient that he is overdue for his liver and lipid panel.

## 2018-06-19 ENCOUNTER — Telehealth: Payer: Self-pay | Admitting: Cardiology

## 2018-06-19 NOTE — Telephone Encounter (Signed)
Patient called reporting being "Sick" since Monday. He was throwing up all day on Monday. Yesterday he has been having indigestion along with chest pain that has continued into today. He describes it as central chest pain that has remained constant since yesterday that radiates to his lower back. He reports when he takes a deep breath it increases the pain. Patient is out of town. Advised patient to go to closest emergency department and follow up with [place] afterwards. Patient verbally understands.

## 2019-03-05 DIAGNOSIS — K219 Gastro-esophageal reflux disease without esophagitis: Secondary | ICD-10-CM

## 2019-03-05 DIAGNOSIS — E039 Hypothyroidism, unspecified: Secondary | ICD-10-CM

## 2019-03-05 DIAGNOSIS — I2510 Atherosclerotic heart disease of native coronary artery without angina pectoris: Secondary | ICD-10-CM

## 2019-03-05 DIAGNOSIS — R0789 Other chest pain: Secondary | ICD-10-CM

## 2019-03-05 DIAGNOSIS — E109 Type 1 diabetes mellitus without complications: Secondary | ICD-10-CM

## 2019-03-05 DIAGNOSIS — I10 Essential (primary) hypertension: Secondary | ICD-10-CM

## 2019-03-05 DIAGNOSIS — Z951 Presence of aortocoronary bypass graft: Secondary | ICD-10-CM

## 2019-03-06 DIAGNOSIS — R079 Chest pain, unspecified: Secondary | ICD-10-CM

## 2019-03-10 ENCOUNTER — Other Ambulatory Visit: Payer: Self-pay

## 2019-03-10 ENCOUNTER — Ambulatory Visit (INDEPENDENT_AMBULATORY_CARE_PROVIDER_SITE_OTHER): Payer: BLUE CROSS/BLUE SHIELD | Admitting: Cardiology

## 2019-03-10 ENCOUNTER — Encounter: Payer: Self-pay | Admitting: Cardiology

## 2019-03-10 VITALS — BP 112/70 | HR 82 | Ht 70.0 in | Wt 188.4 lb

## 2019-03-10 DIAGNOSIS — E1059 Type 1 diabetes mellitus with other circulatory complications: Secondary | ICD-10-CM

## 2019-03-10 DIAGNOSIS — Z951 Presence of aortocoronary bypass graft: Secondary | ICD-10-CM

## 2019-03-10 DIAGNOSIS — E039 Hypothyroidism, unspecified: Secondary | ICD-10-CM

## 2019-03-10 DIAGNOSIS — I10 Essential (primary) hypertension: Secondary | ICD-10-CM

## 2019-03-10 NOTE — Patient Instructions (Signed)
Medication Instructions:  Your physician recommends that you continue on your current medications as directed. Please refer to the Current Medication list given to you today.  If you need a refill on your cardiac medications before your next appointment, please call your pharmacy.   Lab work: None.  If you have labs (blood work) drawn today and your tests are completely normal, you will receive your results only by: . MyChart Message (if you have MyChart) OR . A paper copy in the mail If you have any lab test that is abnormal or we need to change your treatment, we will call you to review the results.  Testing/Procedures: None.   Follow-Up: At [facility], you and your health needs are our priority.  As part of our continuing mission to provide you with exceptional heart care, we have created designated Provider Care Teams.  These Care Teams include your primary Cardiologist (physician) and Advanced Practice Providers (APPs -  Physician Assistants and Nurse Practitioners) who all work together to provide you with the care you need, when you need it. You will need a follow up appointment in 1 months.  Please call our office 2 months in advance to schedule this appointment.  You may see No primary care provider on file. or another member of our [facility] Provider Team in [place]: Brian Munley, MD . Rajan Revankar, MD  Any Other Special Instructions Will Be Listed Below (If Applicable).     

## 2019-03-10 NOTE — Progress Notes (Signed)
Cardiology Office Note:    Date:  03/10/2019   ID:  Louis Byrd, DOB 09/29/1975, MRN [id number]  PCP:  Dema SeverinYork, Regina F, NP  Cardiologist:  Gypsy Balsamobert Krasowski, MD    Referring MD: Dema SeverinYork, Regina F, NP   Chief Complaint  Patient presents with  . Hospitalization Follow-up  I was in the hospital  History of Present Illness:    Louis Byrd is a 43 y.o. male with coronary artery disease status post coronary artery bypass graft done a year ago.  Also diabetes hypertension dyslipidemia hypothyroidism.  Recently he ended up getting to round of hospital because of some very atypical symptoms.  Stress test was done which showed no evidence of ischemia.  He thinks that everything is related to Synthroid that dose has been increased recently.  He cannot Synthroid completely away by himself and started feeling better.  For appropriately he was put a smaller dose while in the hospital.  Now he comes to my office doing better but still complaining of feeling some strength sensation he describes as fogginess.  Past Medical History:  Diagnosis Date  . Arthritis   . Bursitis of right hip   . Chest pain    a. 1/2/019 Ex MV [facility]([place]): Ex time 8:00 No ecg changes. Large severe apical rev defect and small mod sev apicoseptal rev defect. EF 42%.  . Coronary artery disease   . Hyperlipidemia   . Hypothyroidism   . Palpitations    a. premature atrial contractions.  . Pulmonary nodule    a. 08/[facility]: "pulm nodule posterior lung base on lateral film, rec noncontrast chest CT for further eval.  . Seizures (HCC)    "only related to low blood sugars" (09/25/2017)  . Type I diabetes mellitus (HCC)    a. Dx age 43;  b. Most recent A1c 6.3.    Past Surgical History:  Procedure Laterality Date  . CORONARY ARTERY BYPASS GRAFT N/A 10/01/2017   Procedure: CORONARY ARTERY BYPASS GRAFTING (CABG) x5 using the left internal mammary artery, the right greater saphenous vein harvested endoscopically, and  the left radial artery.;  Surgeon: Loreli SlotHendrickson, Steven C, MD;  [facility];  Service: Open Heart Surgery;  Laterality: N/A;  . I and D of groin  2017   for MRSA infection  . KNEE ARTHROSCOPY Right 1990s  . LEFT HEART CATH AND CORONARY ANGIOGRAPHY N/A 09/26/2017   Procedure: LEFT HEART CATH AND CORONARY ANGIOGRAPHY;  Surgeon: Tonny Bollmanooper, Michael, MD;  [facility];  Service: Cardiovascular;  Laterality: N/A;  . RADIAL ARTERY HARVEST Left 10/01/2017   Procedure: RADIAL ARTERY HARVEST;  Surgeon: Loreli SlotHendrickson, Steven C, MD;  [facility];  Service: Open Heart Surgery;  Laterality: Left;  . TEE WITHOUT CARDIOVERSION N/A 10/01/2017   Procedure: TRANSESOPHAGEAL ECHOCARDIOGRAM (TEE);  Surgeon: Loreli SlotHendrickson, Steven C, MD;  [facility];  Service: Open Heart Surgery;  Laterality: N/A;    Current Medications: Current Meds  Medication Sig  . aspirin EC 81 MG tablet Take 1 tablet (81 mg total) by mouth daily.  . carvedilol (COREG) 12.5 MG tablet Take 1 tablet (12.5 mg total) by mouth 2 (two) times daily.  . insulin regular (NOVOLIN R) 100 units/mL injection Inject into the skin 3 (three) times daily before meals.  Marland Kitchen. levothyroxine (SYNTHROID) 112 MCG tablet Take 112 mcg by mouth daily.   Marland Kitchen. lisinopril (PRINIVIL,ZESTRIL) 10 MG tablet Take 1 tablet (10 mg total) by mouth daily.  . rosuvastatin (CRESTOR)  40 MG tablet Take 40 mg by mouth daily.  . TRESIBA FLEXTOUCH 100 UNIT/ML SOPN FlexTouch Pen Inject 40 Units into the skin daily.     Allergies:   Patient has no known allergies.   Social History   Socioeconomic History  . Marital status: Married    Spouse name: Not on file  . Number of children: Not on file  . Years of education: Not on file  . Highest education level: Not on file  Occupational History  . 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  Social Needs  . Financial resource strain: Not on file  . Food insecurity    Worry: Not on file     Inability: Not on file  . Transportation needs    Medical: Not on file    Non-medical: Not on file  Tobacco Use  . Smoking status: Never Smoker  . Smokeless tobacco: Current User    Types: Chew, Snuff  Substance and Sexual Activity  . Alcohol use: Yes    Alcohol/week: 28.0 standard drinks    Types: 28 Cans of beer per week    Frequency: Never    Comment: 09/25/2017 "2, 25oz beers/night" - has stopped as of  09/24/17  . Drug use: No  . Sexual activity: Not on file  Lifestyle  . Physical activity    Days per week: Not on file    Minutes per session: Not on file  . Stress: Not on file  Relationships  . Social Musicianconnections    Talks on phone: Not on file    Gets together: Not on file    Attends religious service: Not on file    Active member of club or organization: Not on file    Attends meetings of clubs or organizations: Not on file    Relationship status: Not on file  Other Topics Concern  . Not on file  Social History Narrative   [place] in [place] with his wife and their children.     Family History: The patient's family history includes Diabetes Mellitus I in his brother; Heart attack in his father. ROS:   Please see the history of present illness.    All 14 point review of systems negative except as described per history of present illness  EKGs/Labs/Other Studies Reviewed:      Recent Labs: No results found for requested labs within last 8760 hours.  Recent Lipid Panel    Component Value Date/Time   CHOL 158 01/29/2018 0845   TRIG 71 01/29/2018 0845   HDL 66 01/29/2018 0845   CHOLHDL 2.4 01/29/2018 0845   LDLCALC 78 01/29/2018 0845    Physical Exam:    VS:  BP 112/70   Pulse 82   Ht 5\' 10"  (1.778 m)   Wt 188 lb 6.4 oz (85.5 kg)   SpO2 97%   BMI 27.03 kg/m     Wt Readings from Last 3 Encounters:  03/10/19 188 lb 6.4 oz (85.5 kg)  01/29/18 195 lb 6.4 oz (88.6 kg)  01/04/18 196 lb (88.9 kg)     GEN:  Well nourished, well developed in no acute  distress HEENT: Normal NECK: No JVD; No carotid bruits LYMPHATICS: No lymphadenopathy CARDIAC: RRR, no murmurs, no rubs, no gallops RESPIRATORY:  Clear to auscultation without rales, wheezing or rhonchi  ABDOMEN: Soft, non-tender, non-distended MUSCULOSKELETAL:  No edema; No deformity  SKIN: Warm and dry LOWER EXTREMITIES: no swelling NEUROLOGIC:  Alert and oriented x 3 PSYCHIATRIC:  Normal affect   ASSESSMENT:    1. S/P CABG x 5   2. Essential hypertension   3. 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 Acquired hypothyroidism    PLAN:    In order of problems listed above:  1. Coronary disease status post coronary bypass graft year ago.  He says recent stress test was negative. 2. Essential hypertension blood pressure controlled continue present management. 3. Diabetes better under control.  We will continue present management. 4. Acquired hypothyroidism: Recent dose of Synthroid adjusted he is doing better.   Medication Adjustments/Labs and Tests Ordered: Current medicines are reviewed at length with the patient today.  Concerns regarding medicines are outlined above.  No orders of the defined types were placed in this encounter.  Medication changes: No orders of the defined types were placed in this encounter.   Signed, Park Liter, MD, [facility] 03/10/2019 4:51 PM    [facility]

## 2019-03-23 IMAGING — DX DG CHEST 1V PORT
1 series · 1 of 1 positions shown · non-contrast
Comparison: 09/27/2017 and earlier.

CLINICAL DATA: 42-year-old male postoperative day zero status post
CABG.

EXAM:
PORTABLE CHEST 1 VIEW

[chest]
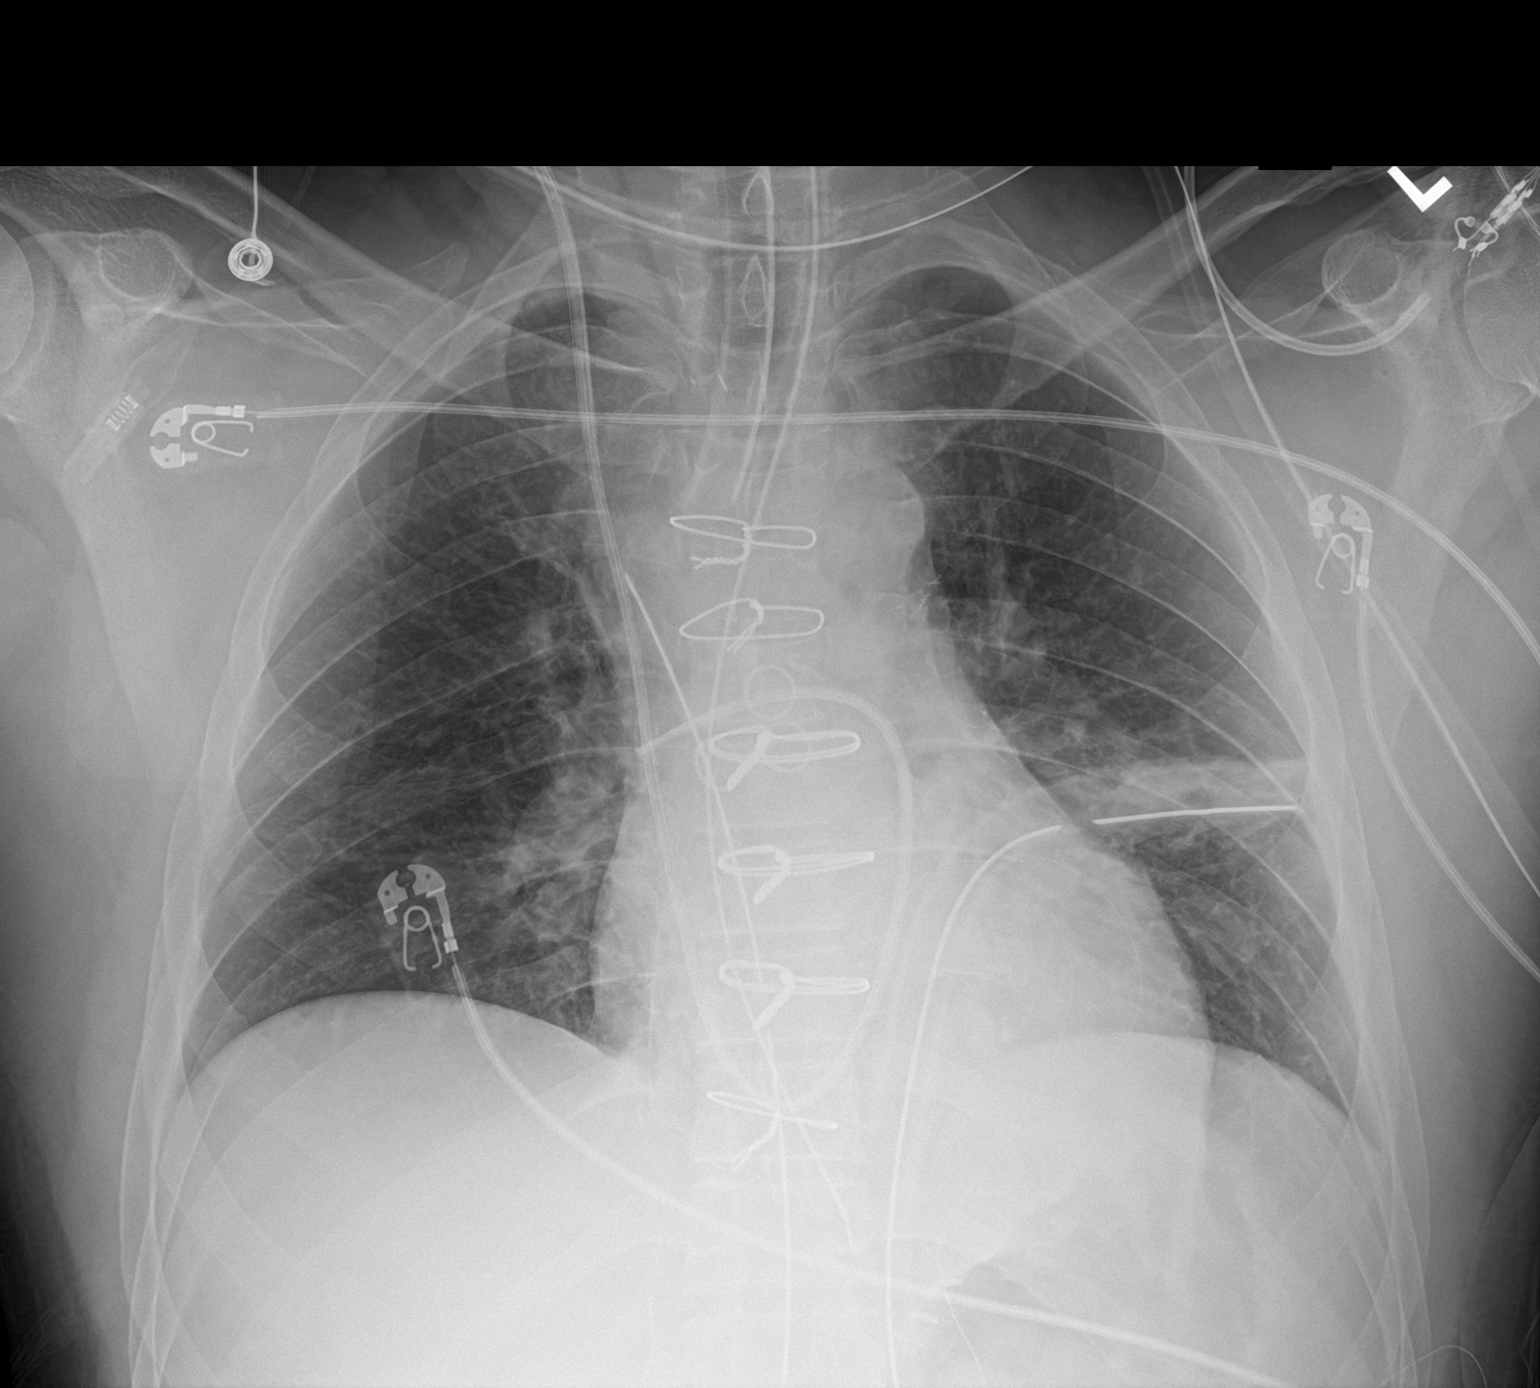

[1 of 1 positions shown; findings below may reference images not displayed]

FINDINGS: Portable AP semi upright view at 7277 hrs.

Endotracheal tube tip in good position just below the clavicles.
Enteric tube courses to the gastroesophageal junction region, side
hole the level of the distal esophagus. Right IJ approach Swan-Ganz
catheter tip is at the level of the right hilum, likely near the
right main pulmonary artery bifurcation. Mediastinal and left chest
tubes are in place.

No pneumothorax or pulmonary edema. Sequelae of CABG. Stable
mediastinal contours. No confluent pulmonary opacity aside from that
along the course of the left chest tube.
IMPRESSION: 1. NG tube tip at the gastroesophageal junction level, side hole
within the distal esophagus. This should be advanced 6-7 cm for
optimal placement.
2. Satisfactory placement of other lines and tubes. Swan-Ganz
catheter tip at the distal right main pulmonary artery level.
3.  No pneumothorax or pulmonary edema.

## 2019-04-18 ENCOUNTER — Encounter: Payer: Self-pay | Admitting: Cardiology

## 2019-04-18 ENCOUNTER — Ambulatory Visit (INDEPENDENT_AMBULATORY_CARE_PROVIDER_SITE_OTHER): Payer: Self-pay | Admitting: Cardiology

## 2019-04-18 ENCOUNTER — Other Ambulatory Visit: Payer: Self-pay

## 2019-04-18 VITALS — BP 114/64 | HR 108 | Wt 183.4 lb

## 2019-04-18 DIAGNOSIS — E781 Pure hyperglyceridemia: Secondary | ICD-10-CM

## 2019-04-18 DIAGNOSIS — I10 Essential (primary) hypertension: Secondary | ICD-10-CM

## 2019-04-18 DIAGNOSIS — E1059 Type 1 diabetes mellitus with other circulatory complications: Secondary | ICD-10-CM

## 2019-04-18 DIAGNOSIS — Z951 Presence of aortocoronary bypass graft: Secondary | ICD-10-CM

## 2019-04-18 NOTE — Patient Instructions (Signed)
Medication Instructions:  Your physician recommends that you continue on your current medications as directed. Please refer to the Current Medication list given to you today.  If you need a refill on your cardiac medications before your next appointment, please call your pharmacy.   Lab work: None.  If you have labs (blood work) drawn today and your tests are completely normal, you will receive your results only by: . MyChart Message (if you have MyChart) OR . A paper copy in the mail If you have any lab test that is abnormal or we need to change your treatment, we will call you to review the results.  Testing/Procedures: Your physician has requested that you have an echocardiogram. Echocardiography is a painless test that uses sound waves to create images of your heart. It provides your doctor with information about the size and shape of your heart and how well your heart's chambers and valves are working. This procedure takes approximately one hour. There are no restrictions for this procedure.    Follow-Up: At [facility], you and your health needs are our priority.  As part of our continuing mission to provide you with exceptional heart care, we have created designated Provider Care Teams.  These Care Teams include your primary Cardiologist (physician) and Advanced Practice Providers (APPs -  Physician Assistants and Nurse Practitioners) who all work together to provide you with the care you need, when you need it. You will need a follow up appointment in 1 months.  Please call our office 2 months in advance to schedule this appointment.  You may see No primary care provider on file. or another member of our [facility] Provider Team in [place]: Brian Munley, MD . Rajan Revankar, MD  Any Other Special Instructions Will Be Listed Below (If Applicable).   Echocardiogram An echocardiogram is a procedure that uses painless sound waves (ultrasound) to produce an image of the heart.  Images from an echocardiogram can provide important information about:  Signs of coronary artery disease (CAD).  Aneurysm detection. An aneurysm is a weak or damaged part of an artery wall that bulges out from the normal force of blood pumping through the body.  Heart size and shape. Changes in the size or shape of the heart can be associated with certain conditions, including heart failure, aneurysm, and CAD.  Heart muscle function.  Heart valve function.  Signs of a past heart attack.  Fluid buildup around the heart.  Thickening of the heart muscle.  A tumor or infectious growth around the heart valves. Tell a health care provider about:  Any allergies you have.  All medicines you are taking, including vitamins, herbs, eye drops, creams, and over-the-counter medicines.  Any blood disorders you have.  Any surgeries you have had.  Any medical conditions you have.  Whether you are pregnant or may be pregnant. What are the risks? Generally, this is a safe procedure. However, problems may occur, including:  Allergic reaction to dye (contrast) that may be used during the procedure. What happens before the procedure? No specific preparation is needed. You may eat and drink normally. What happens during the procedure?   An IV tube may be inserted into one of your veins.  You may receive contrast through this tube. A contrast is an injection that improves the quality of the pictures from your heart.  A gel will be applied to your chest.  A wand-like tool (transducer) will be moved over your chest. The gel will help to   transmit the sound waves from the transducer.  The sound waves will harmlessly bounce off of your heart to allow the heart images to be captured in real-time motion. The images will be recorded on a computer. The procedure may vary among health care providers and hospitals. What happens after the procedure?  You may return to your normal, everyday life,  including diet, activities, and medicines, unless your health care provider tells you not to do that. Summary  An echocardiogram is a procedure that uses painless sound waves (ultrasound) to produce an image of the heart.  Images from an echocardiogram can provide important information about the size and shape of your heart, heart muscle function, heart valve function, and fluid buildup around your heart.  You do not need to do anything to prepare before this procedure. You may eat and drink normally.  After the echocardiogram is completed, you may return to your normal, everyday life, unless your health care provider tells you not to do that. This information is not intended to replace advice given to you by your health care provider. Make sure you discuss any questions you have with your health care provider. Document Released: 07/28/2000 Document Revised: 11/21/2018 Document Reviewed: 09/02/2016 Elsevier Patient Education  2020 Elsevier Inc.     

## 2019-04-18 NOTE — Addendum Note (Signed)
Addended by: Ashok Norris on: 04/18/2019 04:16 PM   Modules accepted: Orders

## 2019-04-18 NOTE — Progress Notes (Signed)
Cardiology Office Note:    Date:  04/18/2019   ID:  Louis Byrd, DOB 11/10/1975, MRN [id number]  PCP:  Dema SeverinYork, Regina F, NP  Cardiologist:  Gypsy Balsamobert Krasowski, MD    Referring MD: Dema SeverinYork, Regina F, NP   Chief Complaint  Patient presents with  . 1 month follow up  Not feeling well  History of Present Illness:    Louis Byrd is a 43 y.o. male with complex past medical history.  Diabetes, coronary artery disease status post coronary artery bypass graft done last year.  Also hypothyroidism, hypertension [facility] overall he is feeling very poorly weak tired exhausted have no energy.  He cannot do anything.  No chest pain tightness squeezing pressure burning chest.  He did have some issue with hypothyroidism however that is been under control right now in spite of that he is not feeling well recently he was in the hospital had a stress test done which showed no evidence of ischemia  Past Medical History:  Diagnosis Date  . Arthritis   . Bursitis of right hip   . Chest pain    a. 1/2/019 Ex [facility]([place]): Ex time 8:00 No ecg changes. Large severe apical rev defect and small mod sev apicoseptal rev defect. EF 42%.  . Coronary artery disease   . Hyperlipidemia   . Hypothyroidism   . Palpitations    a. premature atrial contractions.  . Pulmonary nodule    a. 08/2017 [facility]: "pulm nodule posterior lung base on lateral film, rec noncontrast chest CT for further eval.  . Seizures (HCC)    "only related to low blood sugars" (09/25/2017)  . Type I diabetes mellitus (HCC)    a. Dx age 43;  b. Most recent A1c 6.3.    Past Surgical History:  Procedure Laterality Date  . CORONARY ARTERY BYPASS GRAFT N/A 10/01/2017   Procedure: CORONARY ARTERY BYPASS GRAFTING (CABG) x5 using the left internal mammary artery, the right greater saphenous vein harvested endoscopically, and the left radial artery.;  Surgeon: Loreli SlotHendrickson, Steven C, MD;  [facility];  Service: Open  Heart Surgery;  Laterality: N/A;  . I and D of groin  2017   for MRSA infection  . KNEE ARTHROSCOPY Right 1990s  . LEFT HEART CATH AND CORONARY ANGIOGRAPHY N/A 09/26/2017   Procedure: LEFT HEART CATH AND CORONARY ANGIOGRAPHY;  Surgeon: Tonny Bollmanooper, Michael, MD;  [facility];  Service: Cardiovascular;  Laterality: N/A;  . RADIAL ARTERY HARVEST Left 10/01/2017   Procedure: RADIAL ARTERY HARVEST;  Surgeon: Loreli SlotHendrickson, Steven C, MD;  [facility];  Service: Open Heart Surgery;  Laterality: Left;  . TEE WITHOUT CARDIOVERSION N/A 10/01/2017   Procedure: TRANSESOPHAGEAL ECHOCARDIOGRAM (TEE);  Surgeon: Loreli SlotHendrickson, Steven C, MD;  [facility];  Service: Open Heart Surgery;  Laterality: N/A;    Current Medications: Current Meds  Medication Sig  . aspirin EC 81 MG tablet Take 1 tablet (81 mg total) by mouth daily.  . carvedilol (COREG) 12.5 MG tablet Take 1 tablet (12.5 mg total) by mouth 2 (two) times daily.  . insulin regular (NOVOLIN R) 100 units/mL injection Inject into the skin 3 (three) times daily before meals.  Marland Kitchen. levothyroxine (SYNTHROID) 112 MCG tablet Take 112 mcg by mouth daily.   Marland Kitchen. lisinopril (PRINIVIL,ZESTRIL) 10 MG tablet Take 1 tablet (10 mg total) by mouth daily.  . rosuvastatin (CRESTOR) 40 MG tablet Take 40 mg by mouth daily.  .Marland Kitchen  TRESIBA FLEXTOUCH 100 UNIT/ML SOPN FlexTouch Pen Inject 40 Units into the skin daily.     Allergies:   Patient has no known allergies.   Social History   Socioeconomic History  . Marital status: Married    Spouse name: Not on file  . Number of children: Not on file  . Years of education: Not on file  . Highest education level: Not on file  Occupational History  . Occupation: Armed forces training and education officer    Comment: does some heavy exertion.  Social Needs  . Financial resource strain: Not on file  . Food insecurity    Worry: Not on file    Inability: Not on file  . Transportation needs    Medical: Not on file    Non-medical: Not on  file  Tobacco Use  . Smoking status: Never Smoker  . Smokeless tobacco: Current User    Types: Chew, Snuff  Substance and Sexual Activity  . Alcohol use: Yes    Alcohol/week: 28.0 standard drinks    Types: 28 Cans of beer per week    Frequency: Never    Comment: 09/25/2017 "2, 25oz beers/night" - has stopped as of  09/24/17  . Drug use: No  . Sexual activity: Not on file  Lifestyle  . Physical activity    Days per week: Not on file    Minutes per session: Not on file  . Stress: Not on file  Relationships  . Social Musician on phone: Not on file    Gets together: Not on file    Attends religious service: Not on file    Active member of club or organization: Not on file    Attends meetings of clubs or organizations: Not on file    Relationship status: Not on file  Other Topics Concern  . Not on file  Social History Narrative   Lives in [place] with his wife and their children.     Family History: The patient's family history includes Diabetes Mellitus I in his brother; Heart attack in his father. ROS:   Please see the history of present illness.    All 14 point review of systems negative except as described per history of present illness  EKGs/Labs/Other Studies Reviewed:      Recent Labs: No results found for requested labs within last 8760 hours.  Recent Lipid Panel    Component Value Date/Time   CHOL 158 01/29/2018 0845   TRIG 71 01/29/2018 0845   HDL 66 01/29/2018 0845   CHOLHDL 2.4 01/29/2018 0845   LDLCALC 78 01/29/2018 0845    Physical Exam:    VS:  BP 114/64   Pulse (!) 108   Wt 183 lb 6.4 oz (83.2 kg)   SpO2 97%   BMI 26.32 kg/m     Wt Readings from Last 3 Encounters:  04/18/19 183 lb 6.4 oz (83.2 kg)  03/10/19 188 lb 6.4 oz (85.5 kg)  01/29/18 195 lb 6.4 oz (88.6 kg)     GEN:  Well nourished, well developed in no acute distress HEENT: Normal NECK: No JVD; No carotid bruits LYMPHATICS: No lymphadenopathy CARDIAC: RRR, no  murmurs, no rubs, no gallops RESPIRATORY:  Clear to auscultation without rales, wheezing or rhonchi  ABDOMEN: Soft, non-tender, non-distended MUSCULOSKELETAL:  No edema; No deformity  SKIN: Warm and dry LOWER EXTREMITIES: no swelling NEUROLOGIC:  Alert and oriented x 3 PSYCHIATRIC:  Normal affect   ASSESSMENT:    1. S/P CABG x 5  2. Pure hypertriglyceridemia   3. Essential hypertension   4. 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 Status post coronary bypass graft stable recent stress test no evidence of ischemia. 2. Dyslipidemia he is on Crestor 40 which I will continue. 3. Essential hypertension blood pressure controlled continue present management. 4. Type 2 diabetes followed by anti-medicine team. 5. Overall he feels very poorly will do EKG will do echocardiogram will call primary care physician to get his recent labs.   Medication Adjustments/Labs and Tests Ordered: Current medicines are reviewed at length with the patient today.  Concerns regarding medicines are outlined above.  No orders of the defined types were placed in this encounter.  Medication changes: No orders of the defined types were placed in this encounter.   Signed, Park Liter, MD, [facility] 04/18/2019 4:06 PM    [place]

## 2019-05-06 ENCOUNTER — Ambulatory Visit (INDEPENDENT_AMBULATORY_CARE_PROVIDER_SITE_OTHER): Payer: Self-pay

## 2019-05-06 ENCOUNTER — Other Ambulatory Visit: Payer: Self-pay

## 2019-05-06 DIAGNOSIS — I10 Essential (primary) hypertension: Secondary | ICD-10-CM

## 2019-05-06 NOTE — Progress Notes (Signed)
Complete echocardiogram has been performed.  Jimmy Dossie Swor RDCS, RVT 

## 2019-06-23 ENCOUNTER — Ambulatory Visit (INDEPENDENT_AMBULATORY_CARE_PROVIDER_SITE_OTHER): Payer: Self-pay | Admitting: Cardiology

## 2019-06-23 ENCOUNTER — Other Ambulatory Visit: Payer: Self-pay

## 2019-06-23 ENCOUNTER — Encounter: Payer: Self-pay | Admitting: Cardiology

## 2019-06-23 VITALS — BP 108/70 | HR 84 | Ht 70.0 in | Wt 184.8 lb

## 2019-06-23 DIAGNOSIS — Z951 Presence of aortocoronary bypass graft: Secondary | ICD-10-CM

## 2019-06-23 DIAGNOSIS — E781 Pure hyperglyceridemia: Secondary | ICD-10-CM

## 2019-06-23 DIAGNOSIS — I10 Essential (primary) hypertension: Secondary | ICD-10-CM

## 2019-06-23 DIAGNOSIS — I2510 Atherosclerotic heart disease of native coronary artery without angina pectoris: Secondary | ICD-10-CM

## 2019-06-23 NOTE — Addendum Note (Signed)
Addended by: Ashok Norris on: 06/23/2019 03:55 PM   Modules accepted: Orders

## 2019-06-23 NOTE — Patient Instructions (Signed)
Medication Instructions:  .Your physician recommends that you continue on your current medications as directed. Please refer to the Current Medication list given to you today.  *If you need a refill on your cardiac medications before your next appointment, please call your pharmacy*  Lab Work: Your physician recommends that you return for lab work today: Tsh   If you have labs (blood work) drawn today and your tests are completely normal, you will receive your results only by: Marland Kitchen MyChart Message (if you have MyChart) OR . A paper copy in the mail If you have any lab test that is abnormal or we need to change your treatment, we will call you to review the results.  Testing/Procedures: None.   Follow-Up: At [facility], you and your health needs are our priority.  As part of our continuing mission to provide you with exceptional heart care, we have created designated Provider Care Teams.  These Care Teams include your primary Cardiologist (physician) and Advanced Practice Providers (APPs -  Physician Assistants and Nurse Practitioners) who all work together to provide you with the care you need, when you need it.  Your next appointment:   2 month fu   The format for your next appointment:   In Person  Provider:   Jenne Campus, MD  Other Instructions

## 2019-06-23 NOTE — Progress Notes (Signed)
Cardiology Office Note:    Date:  06/23/2019   ID:  Louis Byrd, DOB Aug 15, 1975, MRN [id number]  PCP:  Dema Severin, NP  Cardiologist:  Gypsy Balsam, MD    Referring MD: Dema Severin, NP   Chief Complaint  Patient presents with  . Follow-up  Still weak and tired  History of Present Illness:    Louis Byrd is a 43 y.o. male with history of coronary artery disease, status post coronary bypass graft few years ago.  Also dyslipidemia diabetes comes today to my office for follow-up last time I seen him which was about 2 months ago he was complaining of having severe fatigue tiredness.  Stress test was done recently which was negative.  He also bottles of blood is being done which also did not reveal the problem however he told me today that recently his primary care physician dramatically decreased the dose of his Synthroid.  Since that time there was no testing done he does not want to come back to his primary care physician.  Therefore, I will ask him to have TSH done today.  I will also check complete metabolic panel, I will see him back in about 2 months.  If his TSH is normal then I will try to cut down his carvedilol to see if he can make him feel any better.  Past Medical History:  Diagnosis Date  . Arthritis   . Bursitis of right hip   . Chest pain    a. 1/2/019 Ex MV [facility] Salvia): Ex time 8:00 No ecg changes. Large severe apical rev defect and small mod sev apicoseptal rev defect. EF 42%.  . Coronary artery disease   . Hyperlipidemia   . Hypothyroidism   . Palpitations    a. premature atrial contractions.  . Pulmonary nodule    a. 08/2017 CXR - [facility]: "pulm nodule posterior lung base on lateral film, rec noncontrast chest CT for further eval.  . Seizures (HCC)    "only related to low blood sugars" (09/25/2017)  . Type I diabetes mellitus (HCC)    a. Dx age 16;  b. Most recent A1c 6.3.    Past Surgical History:  Procedure Laterality Date  . CORONARY ARTERY  BYPASS GRAFT N/A 10/01/2017   Procedure: CORONARY ARTERY BYPASS GRAFTING (CABG) x5 using the left internal mammary artery, the right greater saphenous vein harvested endoscopically, and the left radial artery.;  Surgeon: Loreli Slot, MD;  Location: [facility] OR;  Service: Open Heart Surgery;  Laterality: N/A;  . I and D of groin  2017   for MRSA infection  . KNEE ARTHROSCOPY Right 1990s  . LEFT HEART CATH AND CORONARY ANGIOGRAPHY N/A 09/26/2017   Procedure: LEFT HEART CATH AND CORONARY ANGIOGRAPHY;  Surgeon: Tonny Bollman, MD;  Location: [facility] INVASIVE CV LAB;  Service: Cardiovascular;  Laterality: N/A;  . RADIAL ARTERY HARVEST Left 10/01/2017   Procedure: RADIAL ARTERY HARVEST;  Surgeon: Loreli Slot, MD;  Location: [facility] OR;  Service: Open Heart Surgery;  Laterality: Left;  . TEE WITHOUT CARDIOVERSION N/A 10/01/2017   Procedure: TRANSESOPHAGEAL ECHOCARDIOGRAM (TEE);  Surgeon: Loreli Slot, MD;  Location: [facility] OR;  Service: Open Heart Surgery;  Laterality: N/A;    Current Medications: Current Meds  Medication Sig  . aspirin EC 81 MG tablet Take 1 tablet (81 mg total) by mouth daily.  . carvedilol (COREG) 12.5 MG tablet Take 1 tablet (12.5 mg total) by mouth 2 (two) times daily.  Marland Kitchen  insulin glargine (LANTUS) 100 UNIT/ML injection Inject 20 Units into the skin at bedtime.  . insulin regular (NOVOLIN R) 100 units/mL injection Inject into the skin 3 (three) times daily before meals.  Marland Kitchen. levothyroxine (SYNTHROID) 112 MCG tablet Take 112 mcg by mouth daily.   Marland Kitchen. lisinopril (PRINIVIL,ZESTRIL) 10 MG tablet Take 1 tablet (10 mg total) by mouth daily.  . rosuvastatin (CRESTOR) 40 MG tablet Take 40 mg by mouth daily.     Allergies:   Patient has no known allergies.   Social History   Socioeconomic History  . Marital status: Married    Spouse name: Not on file  . Number of children: Not on file  . Years of education: Not on file  . Highest education level: Not on file  Occupational  History  . 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  Social Needs  . Financial resource strain: Not on file  . Food insecurity    Worry: Not on file    Inability: Not on file  . Transportation needs    Medical: Not on file    Non-medical: Not on file  Tobacco Use  . Smoking status: Never Smoker  . Smokeless tobacco: Current User    Types: Chew, Snuff  Substance and Sexual Activity  . Alcohol use: Yes    Alcohol/week: 28.0 standard drinks    Types: 28 Cans of beer per week    Frequency: Never    Comment: 09/25/2017 "2, 25oz beers/night" - has stopped as of  09/24/17  . Drug use: No  . Sexual activity: Not on file  Lifestyle  . Physical activity    Days per week: Not on file    Minutes per session: Not on file  . Stress: Not on file  Relationships  . Social Musicianconnections    Talks on phone: Not on file    Gets together: Not on file    Attends religious service: Not on file    Active member of club or organization: Not on file    Attends meetings of clubs or organizations: Not on file    Relationship status: Not on file  Other Topics Concern  . Not on file  Social History Narrative   [place] in [place] with his wife and their children.     Family History: The patient's family history includes Diabetes Mellitus I in his brother; Heart attack in his father. ROS:   Please see the history of present illness.    All 14 point review of systems negative except as described per history of present illness  EKGs/Labs/Other Studies Reviewed:      Recent Labs: No results found for requested labs within last 8760 hours.  Recent Lipid Panel    Component Value Date/Time   CHOL 158 01/29/2018 0845   TRIG 71 01/29/2018 0845   HDL 66 01/29/2018 0845   CHOLHDL 2.4 01/29/2018 0845   LDLCALC 78 01/29/2018 0845    Physical Exam:    VS:  BP 108/70   Pulse 84   Ht 5\' 10"  (1.778 m)   Wt 184 lb 12.8 oz (83.8 kg)   SpO2 96%   BMI 26.52 kg/m     Wt  Readings from Last 3 Encounters:  06/23/19 184 lb 12.8 oz (83.8 kg)  04/18/19 183 lb 6.4 oz (83.2 kg)  03/10/19 188 lb 6.4 oz (85.5 kg)     GEN:  Well nourished, well developed in no acute distress HEENT: Normal NECK: No  JVD; No carotid bruits LYMPHATICS: No lymphadenopathy CARDIAC: RRR, no murmurs, no rubs, no gallops RESPIRATORY:  Clear to auscultation without rales, wheezing or rhonchi  ABDOMEN: Soft, non-tender, non-distended MUSCULOSKELETAL:  No edema; No deformity  SKIN: Warm and dry LOWER EXTREMITIES: no swelling NEUROLOGIC:  Alert and oriented x 3 PSYCHIATRIC:  Normal affect   ASSESSMENT:    1. Essential hypertension   2. Pure hypertriglyceridemia   3. Coronary artery disease involving native coronary artery of native heart without angina pectoris   4. S/P CABG x 5    PLAN:    In order of problems listed above:  1. Essential hypertension blood pressure well controlled continue present management 2. Dyslipidemia continue with statin. 3. Coronary disease stable stress test from summer negative. 4. Hypothyroidism we will check his TSH today. 5. Profound weakness and fatigue complete metabolic panel will be checked   Medication Adjustments/Labs and Tests Ordered: Current medicines are reviewed at length with the patient today.  Concerns regarding medicines are outlined above.  Orders Placed This Encounter  Procedures  . TSH   Medication changes: No orders of the defined types were placed in this encounter.   Signed, Park Liter, MD, [facility] 06/23/2019 3:50 PM    [place]

## 2019-06-24 LAB — COMPREHENSIVE METABOLIC PANEL
ALT: 14 IU/L (ref 0–44)
AST: 19 IU/L (ref 0–40)
Albumin/Globulin Ratio: 2 (ref 1.2–2.2)
Albumin: 4.5 g/dL (ref 4.0–5.0)
Alkaline Phosphatase: 94 IU/L (ref 39–117)
BUN/Creatinine Ratio: 13 (ref 9–20)
BUN: 9 mg/dL (ref 6–24)
Bilirubin Total: 0.3 mg/dL (ref 0.0–1.2)
CO2: 26 mmol/L (ref 20–29)
Calcium: 9.6 mg/dL (ref 8.7–10.2)
Chloride: 96 mmol/L (ref 96–106)
Creatinine, Ser: 0.69 mg/dL — ABNORMAL LOW (ref 0.76–1.27)
GFR calc Af Amer: 135 mL/min/{1.73_m2} (ref >59)
GFR calc non Af Amer: 117 mL/min/{1.73_m2} (ref >59)
Globulin, Total: 2.3 g/dL (ref 1.5–4.5)
Glucose: 339 mg/dL — ABNORMAL HIGH (ref 65–99)
Potassium: 4.9 mmol/L (ref 3.5–5.2)
Sodium: 137 mmol/L (ref 134–144)
Total Protein: 6.8 g/dL (ref 6.0–8.5)

## 2019-06-24 LAB — TSH: TSH: 9.36 u[IU]/mL — ABNORMAL HIGH (ref 0.450–4.500)

## 2019-08-25 ENCOUNTER — Ambulatory Visit (INDEPENDENT_AMBULATORY_CARE_PROVIDER_SITE_OTHER): Payer: Self-pay | Admitting: Cardiology

## 2019-08-25 ENCOUNTER — Encounter: Payer: Self-pay | Admitting: Cardiology

## 2019-08-25 ENCOUNTER — Other Ambulatory Visit: Payer: Self-pay

## 2019-08-25 VITALS — BP 120/70 | HR 93 | Ht 70.0 in | Wt 190.0 lb

## 2019-08-25 DIAGNOSIS — E039 Hypothyroidism, unspecified: Secondary | ICD-10-CM

## 2019-08-25 DIAGNOSIS — I10 Essential (primary) hypertension: Secondary | ICD-10-CM

## 2019-08-25 DIAGNOSIS — Z951 Presence of aortocoronary bypass graft: Secondary | ICD-10-CM

## 2019-08-25 DIAGNOSIS — E1059 Type 1 diabetes mellitus with other circulatory complications: Secondary | ICD-10-CM

## 2019-08-25 NOTE — Progress Notes (Signed)
Cardiology Office Note:    Date:  08/25/2019   ID:  Louis Byrd, DOB [birth year], MRN [id number]  PCP:  Dema Severin, NP  Cardiologist:  Gypsy Balsam, MD    Referring MD: Dema Severin, NP   Chief Complaint  Patient presents with  . Follow-up  Doing better but still weak and tired  History of Present Illness:    Louis Byrd is a 44 y.o. male with coronary disease status post coronary bypass graft done years ago, type 1 diabetes, dyslipidemia comes today to my office for follow-up.  Recently discovered that he had hypothyroidism.  He increased medication by himself.  We will forward this to his primary care physician.  He is taking right now 125 mcg on Synthroid.  He is also trying to get in touch with his endocrinologist which I think is an excellent idea.  Denies having any issues tired exhausted but a little bit better I suspect improvement in thyroid control and make him feel better.  Past Medical History:  Diagnosis Date  . Arthritis   . Bursitis of right hip   . Chest pain    a. 1/2/019 Ex MV [facility] Salvia): Ex time 8:00 No ecg changes. Large severe apical rev defect and small mod sev apicoseptal rev defect. EF 42%.  . Coronary artery disease   . Hyperlipidemia   . Hypothyroidism   . Palpitations    a. premature atrial contractions.  . Pulmonary nodule    a. 08/2017 CXR - [facility]: "pulm nodule posterior lung base on lateral film, rec noncontrast chest CT for further eval.  . Seizures (HCC)    "only related to low blood sugars" (09/25/2017)  . Type I diabetes mellitus (HCC)    a. Dx age 89;  b. Most recent A1c 6.3.    Past Surgical History:  Procedure Laterality Date  . CORONARY ARTERY BYPASS GRAFT N/A 10/01/2017   Procedure: CORONARY ARTERY BYPASS GRAFTING (CABG) x5 using the left internal mammary artery, the right greater saphenous vein harvested endoscopically, and the left radial artery.;  Surgeon: Loreli Slot, MD;  Location: [facility] OR;  Service: Open  Heart Surgery;  Laterality: N/A;  . I and D of groin  2017   for MRSA infection  . KNEE ARTHROSCOPY Right 1990s  . LEFT HEART CATH AND CORONARY ANGIOGRAPHY N/A 09/26/2017   Procedure: LEFT HEART CATH AND CORONARY ANGIOGRAPHY;  Surgeon: Tonny Bollman, MD;  Location: [facility] INVASIVE CV LAB;  Service: Cardiovascular;  Laterality: N/A;  . RADIAL ARTERY HARVEST Left 10/01/2017   Procedure: RADIAL ARTERY HARVEST;  Surgeon: Loreli Slot, MD;  Location: [facility] OR;  Service: Open Heart Surgery;  Laterality: Left;  . TEE WITHOUT CARDIOVERSION N/A 10/01/2017   Procedure: TRANSESOPHAGEAL ECHOCARDIOGRAM (TEE);  Surgeon: Loreli Slot, MD;  Location: [facility] OR;  Service: Open Heart Surgery;  Laterality: N/A;    Current Medications: Current Meds  Medication Sig  . aspirin EC 81 MG tablet Take 1 tablet (81 mg total) by mouth daily.  . carvedilol (COREG) 12.5 MG tablet Take 1 tablet (12.5 mg total) by mouth 2 (two) times daily.  . insulin glargine (LANTUS) 100 UNIT/ML injection Inject 25 Units into the skin at bedtime.   . insulin regular (NOVOLIN R) 100 units/mL injection Inject into the skin 3 (three) times daily before meals.  Marland Kitchen levothyroxine (SYNTHROID) 112 MCG tablet Take 112 mcg by mouth daily.   Marland Kitchen lisinopril (PRINIVIL,ZESTRIL) 10 MG tablet Take 1 tablet (10 mg total)  by mouth daily.  . rosuvastatin (CRESTOR) 40 MG tablet Take 40 mg by mouth daily.     Allergies:   Patient has no known allergies.   Social History   Socioeconomic History  . Marital status: Married    Spouse name: Not on file  . Number of children: Not on file  . Years of education: Not on file  . Highest education level: Not on file  Occupational History  . Occupation: Armed forces training and education officer    Comment: does some heavy exertion.  Tobacco Use  . Smoking status: Never Smoker  . Smokeless tobacco: Current User    Types: Chew, Snuff  Substance and Sexual Activity  . Alcohol use: Yes    Alcohol/week: 28.0 standard drinks     Types: 28 Cans of beer per week    Comment: 09/25/2017 "2, 25oz beers/night" - has stopped as of  09/24/17  . Drug use: No  . Sexual activity: Not on file  Other Topics Concern  . Not on file  Social History Narrative   Lives in [place] with his wife and their children.   Social Determinants of Health   Financial Resource Strain:   . Difficulty of Paying Living Expenses: Not on file  Food Insecurity:   . Worried About Programme researcher, broadcasting/film/video in the Last Year: Not on file  . Ran Out of Food in the Last Year: Not on file  Transportation Needs:   . Lack of Transportation (Medical): Not on file  . Lack of Transportation (Non-Medical): Not on file  Physical Activity:   . Days of Exercise per Week: Not on file  . Minutes of Exercise per Session: Not on file  Stress:   . Feeling of Stress : Not on file  Social Connections:   . Frequency of Communication with Friends and Family: Not on file  . Frequency of Social Gatherings with Friends and Family: Not on file  . Attends Religious Services: Not on file  . Active Member of Clubs or Organizations: Not on file  . Attends Banker Meetings: Not on file  . Marital Status: Not on file     Family History: The patient's family history includes Diabetes Mellitus I in his brother; Heart attack in his father. ROS:   Please see the history of present illness.    All 14 point review of systems negative except as described per history of present illness  EKGs/Labs/Other Studies Reviewed:      Recent Labs: 06/23/2019: ALT 14; BUN 9; Creatinine, Ser 0.69; Potassium 4.9; Sodium 137; TSH 9.360  Recent Lipid Panel    Component Value Date/Time   CHOL 158 01/29/2018 0845   TRIG 71 01/29/2018 0845   HDL 66 01/29/2018 0845   CHOLHDL 2.4 01/29/2018 0845   LDLCALC 78 01/29/2018 0845    Physical Exam:    VS:  BP 120/70   Pulse 93   Ht 5\' 10"  (1.778 m)   Wt 190 lb (86.2 kg)   SpO2 97%   BMI 27.26 kg/m     Wt Readings from Last 3  Encounters:  08/25/19 190 lb (86.2 kg)  06/23/19 184 lb 12.8 oz (83.8 kg)  04/18/19 183 lb 6.4 oz (83.2 kg)     GEN:  Well nourished, well developed in no acute distress HEENT: Normal NECK: No JVD; No carotid bruits LYMPHATICS: No lymphadenopathy CARDIAC: RRR, no murmurs, no rubs, no gallops RESPIRATORY:  Clear to auscultation without rales, wheezing or rhonchi  ABDOMEN: Soft, non-tender,  non-distended MUSCULOSKELETAL:  No edema; No deformity  SKIN: Warm and dry LOWER EXTREMITIES: no swelling NEUROLOGIC:  Alert and oriented x 3 PSYCHIATRIC:  Normal affect   ASSESSMENT:    1. S/P CABG x 5   2. 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 Acquired hypothyroidism   4. Essential hypertension    PLAN:    In order of problems listed above:  1. Coronary disease status post coronary bypass graft.  Looks good from that point of view.  We will continue present management 2. Type 1 diabetes followed by 10 medicine team but he wants to get back to his endocrinologist which I think is an excellent idea 3. Acquired hypothyroidism Synthroid increased continue present management.  TSH will be checked 4. Essential hypertension stable   Medication Adjustments/Labs and Tests Ordered: Current medicines are reviewed at length with the patient today.  Concerns regarding medicines are outlined above.  No orders of the defined types were placed in this encounter.  Medication changes: No orders of the defined types were placed in this encounter.   Signed, Park Liter, MD, [facility] 08/25/2019 3:07 PM    [place]

## 2019-08-25 NOTE — Patient Instructions (Signed)
Medication Instructions:   Your physician recommends that you continue on your current medications as directed. Please refer to the Current Medication list given to you today.  *If you need a refill on your cardiac medications before your next appointment, please call your pharmacy*  Lab Work: TSH- Today   If you have labs (blood work) drawn today and your tests are completely normal, you will receive your results only by: Marland Kitchen MyChart Message (if you have MyChart) OR . A paper copy in the mail If you have any lab test that is abnormal or we need to change your treatment, we will call you to review the results.  Testing/Procedures: None ordered   Follow-Up: At [facility], you and your health needs are our priority.  As part of our continuing mission to provide you with exceptional heart care, we have created designated Provider Care Teams.  These Care Teams include your primary Cardiologist (physician) and Advanced Practice Providers (APPs -  Physician Assistants and Nurse Practitioners) who all work together to provide you with the care you need, when you need it.  Your next appointment:   2 month(s)  The format for your next appointment:   In Person  Provider:   Gypsy Balsam, MD  Other Instructions None

## 2019-08-26 LAB — TSH: TSH: 4.18 u[IU]/mL (ref 0.450–4.500)

## 2019-08-29 ENCOUNTER — Telehealth: Payer: Self-pay | Admitting: Emergency Medicine

## 2019-08-29 NOTE — Telephone Encounter (Signed)
Left lab results on a patients voicemail per dpr. Advised patient to call with any questions.

## 2019-10-23 ENCOUNTER — Ambulatory Visit (INDEPENDENT_AMBULATORY_CARE_PROVIDER_SITE_OTHER): Payer: Self-pay | Admitting: Cardiology

## 2019-10-23 ENCOUNTER — Encounter: Payer: Self-pay | Admitting: Cardiology

## 2019-10-23 ENCOUNTER — Other Ambulatory Visit: Payer: Self-pay

## 2019-10-23 VITALS — BP 118/70 | HR 80 | Ht 70.0 in | Wt 198.8 lb

## 2019-10-23 DIAGNOSIS — E781 Pure hyperglyceridemia: Secondary | ICD-10-CM

## 2019-10-23 DIAGNOSIS — Z951 Presence of aortocoronary bypass graft: Secondary | ICD-10-CM

## 2019-10-23 DIAGNOSIS — E039 Hypothyroidism, unspecified: Secondary | ICD-10-CM

## 2019-10-23 DIAGNOSIS — E1059 Type 1 diabetes mellitus with other circulatory complications: Secondary | ICD-10-CM

## 2019-10-23 DIAGNOSIS — I10 Essential (primary) hypertension: Secondary | ICD-10-CM

## 2019-10-23 NOTE — Patient Instructions (Signed)

## 2019-10-23 NOTE — Progress Notes (Signed)
Cardiology Office Note:    Date:  10/23/2019   ID:  Louis Byrd, DOB 07/23/1976, MRN [id number]  PCP:  Dema Severin, NP  Cardiologist:  Gypsy Balsam, MD    Referring MD: Dema Severin, NP   No chief complaint on file. Doing well  History of Present Illness:    Louis Byrd is a 44 y.o. male with past medical history significant for coronary artery disease, status post coronary artery bypass graft  2  years ago, diabetes, hypothyroidism, essential hypertension.  Comes today 2 months of follow-up.  Overall seems to be doing well.  He still waiting for appointment with endocrinologist to address 2 issues of hypothyroidism as well as diabetes.  Overall he is gradually feeling better.  He is trying to be active and has no difficulty doing things except getting tired  Past Medical History:  Diagnosis Date  . Arthritis   . Bursitis of right hip   . Chest pain    a. 1/2/019 Ex MV [facility] Salvia): Ex time 8:00 No ecg changes. Large severe apical rev defect and small mod sev apicoseptal rev defect. EF 42%.  . Coronary artery disease   . Hyperlipidemia   . Hypothyroidism   . Palpitations    a. premature atrial contractions.  . Pulmonary nodule    a. 08/2017 CXR - [facility]: "pulm nodule posterior lung base on lateral film, rec noncontrast chest CT for further eval.  . Seizures (HCC)    "only related to low blood sugars" (09/25/2017)  . Type I diabetes mellitus (HCC)    a. Dx age 17;  b. Most recent A1c 6.3.    Past Surgical History:  Procedure Laterality Date  . CORONARY ARTERY BYPASS GRAFT N/A 10/01/2017   Procedure: CORONARY ARTERY BYPASS GRAFTING (CABG) x5 using the left internal mammary artery, the right greater saphenous vein harvested endoscopically, and the left radial artery.;  Surgeon: Loreli Slot, MD;  Location: [facility] OR;  Service: Open Heart Surgery;  Laterality: N/A;  . I and D of groin  2017   for MRSA infection  . KNEE ARTHROSCOPY Right 1990s  . LEFT HEART  CATH AND CORONARY ANGIOGRAPHY N/A 09/26/2017   Procedure: LEFT HEART CATH AND CORONARY ANGIOGRAPHY;  Surgeon: Tonny Bollman, MD;  Location: [facility] INVASIVE CV LAB;  Service: Cardiovascular;  Laterality: N/A;  . RADIAL ARTERY HARVEST Left 10/01/2017   Procedure: RADIAL ARTERY HARVEST;  Surgeon: Loreli Slot, MD;  Location[facility] OR;  Service: Open Heart Surgery;  Laterality: Left;  . TEE WITHOUT CARDIOVERSION N/A 10/01/2017   Procedure: TRANSESOPHAGEAL ECHOCARDIOGRAM (TEE);  Surgeon: Loreli Slot, MD;  Location: [facility] OR;  Service: Open Heart Surgery;  Laterality: N/A;    Current Medications: Current Meds  Medication Sig  . aspirin EC 81 MG tablet Take 1 tablet (81 mg total) by mouth daily.  . carvedilol (COREG) 12.5 MG tablet Take 1 tablet (12.5 mg total) by mouth 2 (two) times daily.  . insulin glargine (LANTUS) 100 UNIT/ML injection Inject 25 Units into the skin at bedtime.   . insulin regular (NOVOLIN R) 100 units/mL injection Inject into the skin 3 (three) times daily before meals.  Marland Kitchen levothyroxine (SYNTHROID) 112 MCG tablet Take 112 mcg by mouth daily.   Marland Kitchen lisinopril (PRINIVIL,ZESTRIL) 10 MG tablet Take 1 tablet (10 mg total) by mouth daily.  . rosuvastatin (CRESTOR) 40 MG tablet Take 40 mg by mouth daily.     Allergies:   Patient has no known allergies.  Social History   Socioeconomic History  . Marital status: Married    Spouse name: Not on file  . Number of children: Not on file  . Years of education: Not on file  . Highest education level: Not on file  Occupational History  . Occupation: Cytogeneticist    Comment: does some heavy exertion.  Tobacco Use  . Smoking status: Never Smoker  . Smokeless tobacco: Current User    Types: Chew, Snuff  Substance and Sexual Activity  . Alcohol use: Yes    Alcohol/week: 28.0 standard drinks    Types: 28 Cans of beer per week    Comment: 09/25/2017 "2, 25oz beers/night" - has stopped as of  09/24/17  . Drug use: No  .  Sexual activity: Not on file  Other Topics Concern  . Not on file  Social History Narrative   Lives in [place] with his wife and their children.   Social Determinants of Health   Financial Resource Strain:   . Difficulty of Paying Living Expenses:   Food Insecurity:   . Worried About Charity fundraiser in the Last Year:   . Arboriculturist in the Last Year:   Transportation Needs:   . Film/video editor (Medical):   Marland Kitchen Lack of Transportation (Non-Medical):   Physical Activity:   . Days of Exercise per Week:   . Minutes of Exercise per Session:   Stress:   . Feeling of Stress :   Social Connections:   . Frequency of Communication with Friends and Family:   . Frequency of Social Gatherings with Friends and Family:   . Attends Religious Services:   . Active Member of Clubs or Organizations:   . Attends Archivist Meetings:   Marland Kitchen Marital Status:      Family History: The patient's family history includes Diabetes Mellitus I in his brother; Heart attack in his father. ROS:   Please see the history of present illness.    All 14 point review of systems negative except as described per history of present illness  EKGs/Labs/Other Studies Reviewed:      Recent Labs: 06/23/2019: ALT 14; BUN 9; Creatinine, Ser 0.69; Potassium 4.9; Sodium 137 08/25/2019: TSH 4.180  Recent Lipid Panel    Component Value Date/Time   CHOL 158 01/29/2018 0845   TRIG 71 01/29/2018 0845   HDL 66 01/29/2018 0845   CHOLHDL 2.4 01/29/2018 0845   LDLCALC 78 01/29/2018 0845    Physical Exam:    VS:  BP 118/70   Pulse 80   Ht 5\' 10"  (1.778 m)   Wt 198 lb 12.8 oz (90.2 kg)   SpO2 97%   BMI 28.52 kg/m     Wt Readings from Last 3 Encounters:  10/23/19 198 lb 12.8 oz (90.2 kg)  08/25/19 190 lb (86.2 kg)  06/23/19 184 lb 12.8 oz (83.8 kg)     GEN:  Well nourished, well developed in no acute distress HEENT: Normal NECK: No JVD; No carotid bruits LYMPHATICS: No  lymphadenopathy CARDIAC: RRR, no murmurs, no rubs, no gallops RESPIRATORY:  Clear to auscultation without rales, wheezing or rhonchi  ABDOMEN: Soft, non-tender, non-distended MUSCULOSKELETAL:  No edema; No deformity  SKIN: Warm and dry LOWER EXTREMITIES: no swelling NEUROLOGIC:  Alert and oriented x 3 PSYCHIATRIC:  Normal affect   ASSESSMENT:    1. S/P CABG x 5   2. Pure hypertriglyceridemia   3. 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 Acquired hypothyroidism   5. Essential hypertension    PLAN:    In order of problems listed above:  1. Status post coronary bypass graft stable from that point to be doing well. 2. Dyslipidemia: On Crestor 40 which I will continue. 3. Type 1 diabetes he is waiting for appointment with endocrinologist. 4. Hypothyroidism, waiting for appointment with endocrinologist. 5. Essential hypertension well-controlled.   Medication Adjustments/Labs and Tests Ordered: Current medicines are reviewed at length with the patient today.  Concerns regarding medicines are outlined above.  No orders of the defined types were placed in this encounter.  Medication changes: No orders of the defined types were placed in this encounter.   Signed, Georgeanna Lea, MD, [facility] 10/23/2019 3:08 PM    [facility]

## 2019-12-27 ENCOUNTER — Emergency Department (HOSPITAL_COMMUNITY): Payer: Self-pay

## 2019-12-27 ENCOUNTER — Encounter (HOSPITAL_COMMUNITY): Payer: Self-pay | Admitting: Emergency Medicine

## 2019-12-27 ENCOUNTER — Emergency Department (HOSPITAL_COMMUNITY)
Admission: EM | Admit: 2019-12-27 | Discharge: 2019-12-27 | Disposition: A | Discharge status: Home / Self Care | Payer: Self-pay | Attending: Emergency Medicine | Admitting: Emergency Medicine

## 2019-12-27 ENCOUNTER — Other Ambulatory Visit: Payer: Self-pay

## 2019-12-27 DIAGNOSIS — R079 Chest pain, unspecified: Secondary | ICD-10-CM

## 2019-12-27 DIAGNOSIS — Z7982 Long term (current) use of aspirin: Secondary | ICD-10-CM | POA: Insufficient documentation

## 2019-12-27 DIAGNOSIS — R61 Generalized hyperhidrosis: Secondary | ICD-10-CM | POA: Insufficient documentation

## 2019-12-27 DIAGNOSIS — R5383 Other fatigue: Secondary | ICD-10-CM | POA: Insufficient documentation

## 2019-12-27 DIAGNOSIS — Z794 Long term (current) use of insulin: Secondary | ICD-10-CM | POA: Insufficient documentation

## 2019-12-27 DIAGNOSIS — Z79899 Other long term (current) drug therapy: Secondary | ICD-10-CM | POA: Insufficient documentation

## 2019-12-27 DIAGNOSIS — E039 Hypothyroidism, unspecified: Secondary | ICD-10-CM | POA: Insufficient documentation

## 2019-12-27 DIAGNOSIS — F17220 Nicotine dependence, chewing tobacco, uncomplicated: Secondary | ICD-10-CM | POA: Insufficient documentation

## 2019-12-27 DIAGNOSIS — R0602 Shortness of breath: Secondary | ICD-10-CM | POA: Insufficient documentation

## 2019-12-27 DIAGNOSIS — I2510 Atherosclerotic heart disease of native coronary artery without angina pectoris: Secondary | ICD-10-CM | POA: Insufficient documentation

## 2019-12-27 DIAGNOSIS — I10 Essential (primary) hypertension: Secondary | ICD-10-CM | POA: Insufficient documentation

## 2019-12-27 DIAGNOSIS — F17228 Nicotine dependence, chewing tobacco, with other nicotine-induced disorders: Secondary | ICD-10-CM | POA: Insufficient documentation

## 2019-12-27 DIAGNOSIS — E109 Type 1 diabetes mellitus without complications: Secondary | ICD-10-CM | POA: Insufficient documentation

## 2019-12-27 DIAGNOSIS — Z951 Presence of aortocoronary bypass graft: Secondary | ICD-10-CM | POA: Insufficient documentation

## 2019-12-27 LAB — BASIC METABOLIC PANEL
Anion gap: 15 (ref 5–15)
BUN: <5 mg/dL — ABNORMAL LOW (ref 6–20)
CO2: 23 mmol/L (ref 22–32)
Calcium: 9.8 mg/dL (ref 8.9–10.3)
Chloride: 104 mmol/L (ref 98–111)
Creatinine, Ser: 0.66 mg/dL (ref 0.61–1.24)
GFR calc Af Amer: >60 mL/min (ref >60)
GFR calc non Af Amer: >60 mL/min (ref >60)
Glucose, Bld: 91 mg/dL (ref 70–99)
Potassium: 3.6 mmol/L (ref 3.5–5.1)
Sodium: 142 mmol/L (ref 135–145)

## 2019-12-27 LAB — TROPONIN I (HIGH SENSITIVITY)
Troponin I (High Sensitivity): 11 ng/L (ref <18)
Troponin I (High Sensitivity): 13 ng/L (ref <18)

## 2019-12-27 LAB — CBC
HCT: 45.3 % (ref 39.0–52.0)
Hemoglobin: 15.8 g/dL (ref 13.0–17.0)
MCH: 30.9 pg (ref 26.0–34.0)
MCHC: 34.9 g/dL (ref 30.0–36.0)
MCV: 88.5 fL (ref 80.0–100.0)
Platelets: 289 10*3/uL (ref 150–400)
RBC: 5.12 MIL/uL (ref 4.22–5.81)
RDW: 12.4 % (ref 11.5–15.5)
WBC: 5.8 10*3/uL (ref 4.0–10.5)
nRBC: 0 % (ref 0.0–0.2)

## 2019-12-27 LAB — TSH: TSH: 1.009 u[IU]/mL (ref 0.350–4.500)

## 2019-12-27 MED ORDER — SODIUM CHLORIDE 0.9% FLUSH: 3.0000 mL | Freq: Once | INTRAVENOUS | Status: DC

## 2019-12-27 MED ORDER — NITROGLYCERIN 0.4 MG SL SUBL: 0.4000 mg | SUBLINGUAL_TABLET | SUBLINGUAL | Status: DC | PRN

## 2019-12-27 NOTE — ED Triage Notes (Signed)
Pt c/o chest pain, dizziness, diaphoresis, and shortness of breath. Hx CABG and type 1 diabetes.

## 2019-12-27 NOTE — ED Notes (Signed)
Pt verbalized understanding of d/c instructions, follow up care and s/s requiring return to ed. Pt had no further questions and refused wheelchair. Pt ambulated unassisted to exit.

## 2019-12-27 NOTE — Discharge Instructions (Signed)
Please return for worsening pain.  Please follow-up with your primary cardiologist give them a call on Monday and discuss your visit here.  Try zantac or pepcid twice a day.  Try to avoid things that may make this worse, most commonly these are spicy foods tomato based products fatty foods chocolate and peppermint.  Alcohol and tobacco can also make this worse.  Return to the emergency department for sudden worsening pain fever or inability to eat or drink.

## 2019-12-27 NOTE — ED Provider Notes (Signed)
[name] EMERGENCY DEPARTMENT Provider Note   CSN: [id number] Arrival date & time: 12/27/19  1555     History Chief Complaint  Patient presents with  . Chest Pain    Louis Byrd is a 44 y.o. male.  44 yo M with a chief complaint of chest pain. Patient states he was playing music with a friend and suddenly felt a pressure to his chest and became diaphoretic. Felt he was going to pass out. The diaphoresis is ended but the patient is felt very weak and continues to feel pressure on his chest. Denies radiation. Has a history of five way bypass. Symptoms that he had prior to that were somewhat similar he would have chest pressure have been diaphoresis with very minimal exertion and was very fatigued. Recently had his levothyroxine changed. Denies any other medication changes.  The history is provided by the patient.  Chest Pain Pain location:  Substernal area Pain quality: pressure   Pain radiates to:  Does not radiate Pain severity:  Moderate Onset quality:  Gradual Duration:  4 hours Timing:  Constant Progression:  Unchanged Chronicity:  New Relieved by:  Nothing Worsened by:  Nothing Ineffective treatments:  None tried Associated symptoms: diaphoresis, fatigue and shortness of breath   Associated symptoms: no abdominal pain, no fever, no headache, no palpitations and no vomiting        Past Medical History:  Diagnosis Date  . Arthritis   . Bursitis of right hip   . Chest pain    a. 1/2/019 Ex [facility]): Ex time 8:00 No ecg changes. Large severe apical rev defect and small mod sev apicoseptal rev defect. EF 42%.  . Coronary artery disease   . Hyperlipidemia   . Hypothyroidism   . Palpitations    a. premature atrial contractions.  . Pulmonary nodule    a. 08/2017 CXR - [facility] "pulm nodule posterior lung base on lateral film, rec noncontrast chest CT for further eval.  . Seizures (HCC)    "only related to low blood sugars" (09/25/2017)    . Type I diabetes mellitus (HCC)    a. Dx age 35;  b. Most recent A1c 6.3.    Patient Active Problem List   Diagnosis Date Noted  . Essential hypertension 01/29/2018  . S/P CABG x 5 10/12/2017  . Coronary artery disease 10/01/2017  . Chest pain 09/25/2017  . Solitary pulmonary nodule 09/25/2017  . Diabetes (HCC) 09/25/2017  . Hyperlipidemia 09/25/2017  . Hypothyroidism 09/25/2017  . Unstable angina [facility])     Past Surgical History:  Procedure Laterality Date  . CORONARY ARTERY BYPASS GRAFT N/A 10/01/2017   Procedure: CORONARY ARTERY BYPASS GRAFTING (CABG) x5 using the left internal mammary artery, the right greater saphenous vein harvested endoscopically, and the left radial artery.;  Surgeon: Loreli Slot, MD;  Location: [facility] OR;  Service: Open Heart Surgery;  Laterality: N/A;  . I and D of groin  2017   for MRSA infection  . KNEE ARTHROSCOPY Right 1990s  . LEFT HEART CATH AND CORONARY ANGIOGRAPHY N/A 09/26/2017   Procedure: LEFT HEART CATH AND CORONARY ANGIOGRAPHY;  Surgeon: Tonny Bollman, MD;  Location: [facility] INVASIVE CV LAB;  Service: Cardiovascular;  Laterality: N/A;  . RADIAL ARTERY HARVEST Left 10/01/2017   Procedure: RADIAL ARTERY HARVEST;  Surgeon: Loreli Slot, MD;  Location: [facility] OR;  Service: Open Heart Surgery;  Laterality: Left;  . TEE WITHOUT CARDIOVERSION N/A 10/01/2017   Procedure: TRANSESOPHAGEAL ECHOCARDIOGRAM (TEE);  Surgeon: Loreli Slot, MD;  Location: [facility] OR;  Service: Open Heart Surgery;  Laterality: N/A;       Family History  Problem Relation Age of Onset  . Heart attack Father   . Diabetes Mellitus I Brother     Social History   Tobacco Use  . Smoking status: Never Smoker  . Smokeless tobacco: Current User    Types: Chew, Snuff  Substance Use Topics  . Alcohol use: Yes    Alcohol/week: 28.0 standard drinks    Types: 28 Cans of beer per week    Comment: 09/25/2017 "2, 25oz beers/night" - has stopped as of  09/24/17  . Drug  use: No    Home Medications Prior to Admission medications   Medication Sig Start Date End Date Taking? Authorizing Provider  aspirin EC 81 MG tablet Take 1 tablet (81 mg total) by mouth daily. 01/04/18  Yes Georgeanna Lea, MD  carvedilol (COREG) 12.5 MG tablet Take 1 tablet (12.5 mg total) by mouth 2 (two) times daily. 01/22/18 03/09/20 Yes Baldo Daub, MD  lisinopril (PRINIVIL,ZESTRIL) 10 MG tablet Take 1 tablet (10 mg total) by mouth daily. 01/29/18 03/09/20 Yes Georgeanna Lea, MD  omeprazole (PRILOSEC) 20 MG capsule Take 20 mg by mouth daily. 12/19/19  Yes [provider]  SYNTHROID 125 MCG tablet Take 125 mcg by mouth daily. 12/19/19  Yes [provider]  HUMALOG 100 UNIT/ML injection Inject 3-12 Units into the skin daily as needed. 12/19/19   [provider]  Insulin Glargine (BASAGLAR KWIKPEN) 100 UNIT/ML Inject 30 Units into the skin daily. 12/19/19   [provider]  insulin regular (NOVOLIN R) 100 units/mL injection Inject into the skin 3 (three) times daily before meals.    [provider]    Allergies    Patient has no known allergies.  Review of Systems   Review of Systems  Constitutional: Positive for diaphoresis and fatigue. Negative for chills and fever.  HENT: Negative for congestion and facial swelling.   Eyes: Negative for discharge and visual disturbance.  Respiratory: Positive for shortness of breath.   Cardiovascular: Positive for chest pain. Negative for palpitations.  Gastrointestinal: Negative for abdominal pain, diarrhea and vomiting.  Musculoskeletal: Negative for arthralgias and myalgias.  Skin: Negative for color change and rash.  Neurological: Negative for tremors, syncope and headaches.  Psychiatric/Behavioral: Negative for confusion and dysphoric mood.    Physical Exam Updated Vital Signs BP 130/83   Pulse (!) 48   Temp 97.7 F (36.5 C) (Oral)   Resp 15   SpO2 96%   Physical Exam Vitals and  nursing note reviewed.  Constitutional:      Appearance: He is well-developed.  HENT:     Head: Normocephalic and atraumatic.  Eyes:     Pupils: Pupils are equal, round, and reactive to light.  Neck:     Vascular: No JVD.  Cardiovascular:     Rate and Rhythm: Normal rate and regular rhythm.     Heart sounds: No murmur. No friction rub. No gallop.   Pulmonary:     Effort: No respiratory distress.     Breath sounds: No wheezing.  Abdominal:     General: There is no distension.     Tenderness: There is no guarding or rebound.  Musculoskeletal:        General: Normal range of motion.     Cervical back: Normal range of motion and neck supple.  Skin:    Coloration:  Skin is not pale.     Findings: No rash.  Neurological:     Mental Status: He is alert and oriented to person, place, and time.  Psychiatric:        Behavior: Behavior normal.     ED Results / Procedures / Treatments   Labs (all labs ordered are listed, but only abnormal results are displayed) Labs Reviewed  BASIC METABOLIC PANEL - Abnormal; Notable for the following components:      Result Value   BUN <5 (*)    All other components within normal limits  CBC  TSH  TROPONIN I (HIGH SENSITIVITY)  TROPONIN I (HIGH SENSITIVITY)    EKG EKG Interpretation  Date/Time:  Saturday Dec 27 2019 15:58:09 EDT Ventricular Rate:  68 PR Interval:  136 QRS Duration: 88 QT Interval:  382 QTC Calculation: 406 R Axis:   42 Text Interpretation: Sinus rhythm with Premature atrial complexes Otherwise normal ECG Baseline wander TECHNICALLY DIFFICULT Otherwise no significant change Confirmed by Deno Etienne [phone]) on 12/27/2019 6:41:57 PM   Radiology DG Chest 2 View  Result Date: 12/27/2019 CLINICAL DATA:  Chest pain EXAM: CHEST - 2 VIEW COMPARISON:  03/05/2019 FINDINGS: Cardiac shadows within normal limits. Postsurgical changes are again seen. The lungs are clear. No focal infiltrate is noted. No bony abnormality is seen.  IMPRESSION: No active cardiopulmonary disease. Electronically Signed   By: Inez Catalina M.D.   On: 12/27/2019 16:33    Procedures Procedures (including critical care time)  Medications Ordered in ED Medications  sodium chloride flush (NS) 0.9 % injection 3 mL (has no administration in time range)  nitroGLYCERIN (NITROSTAT) SL tablet 0.4 mg (has no administration in time range)    ED Course  I have reviewed the triage vital signs and the nursing notes.  Pertinent labs & imaging results that were available during my care of the patient were reviewed by me and considered in my medical decision making (see chart for details).    MDM Rules/Calculators/A&P                      44 yo M with a cc of chest pain. Patient has a history of five way bypass done a couple years ago. Has had some symptoms that are similar to this just prior to bypass. Currently he is well-appearing and nontoxic. He is describing continued chest discomfort. His EKG does not show any obvious new ischemic changes. His troponins are negative x2. Will give nitroglycerin. Discussed with cardiology. Will come and evaluate.   Patient was seen by the cardiology fellow on-call, Dr. Neena Rhymes.  He felt that the patient's symptoms were unlikely to be cardiac in origin based on the history and with 2 - troponins and a reassuring EKG he felt it would be reasonable to have him follow-up with his primary cardiologist in the office.  He did recommend getting a TSH as the patient had had a prior history of similar symptoms when he was hypothyroid.  TSH has returned and is normal.  We will have the patient follow-up with his cardiologist.  Will return for worsening symptoms.  10:15 PM:  I have discussed the diagnosis/risks/treatment options with the patient and family and believe the pt to be eligible for discharge home to follow-up with Cards. We also discussed returning to the ED immediately if new or worsening sx occur. We discussed the  sx which are most concerning (e.g., sudden worsening pain, fever, inability to tolerate by mouth)  that necessitate immediate return. Medications administered to the patient during their visit and any new prescriptions provided to the patient are listed below.  Medications given during this visit Medications  sodium chloride flush (NS) 0.9 % injection 3 mL (has no administration in time range)  nitroGLYCERIN (NITROSTAT) SL tablet 0.4 mg (has no administration in time range)     The patient appears reasonably screen and/or stabilized for discharge and I doubt any other medical condition or other [facility] requiring further screening, evaluation, or treatment in the ED at this time prior to discharge.    Final Clinical Impression(s) / ED Diagnoses Final diagnoses:  Nonspecific chest pain    Rx / DC Orders ED Discharge Orders    None       Melene Plan, DO 12/27/19 2215

## 2019-12-27 NOTE — H&P (Signed)
Cardiology Admission History and Physical:   Patient ID: Louis Byrd MRN: [id number]; DOB: 1976/05/09   Admission date: 12/27/2019  Primary Care Provider: Dema Severin, NP Primary Cardiologist: Dr. Bing Matter Primary Electrophysiologist:  None   Chief Complaint:  Diaphoresis, chest pain  Patient Profile:   Louis Byrd is a 44 y.o. male with DM1, hypothyroidism, HTN, and severe multivessel CAD s/p CABG in 2019 who presents with diaphoresis and CP.   History of Present Illness:   Louis Byrd reports sudden onset diaphoresis and chest pain earlier today. He was with a friend playing music at the time of onset. He recalls around 2:30 or 3pm he had just eaten a pizza and shortly afterward he suddenly felt diaphoresis over his whole body. His friend noted that he looked very pale at the time. He also describes chest discomfort localizing to the R side of the chest and to the abdomen. He recalls having similar symptoms last summer for which he was seen in the ED and admitted to the hospital for hyperthyroidism. He has been on a stable dose on synthroid until 6 days ago, at which time his dose was increased to .   The patient became very anxious about his symptoms earlier. He checked his blood sugar and it was found to be in the 160's. He came to the ED for evaluation.   On arrival to the ED the patient was noted to be mildly bradycardic (HR's in the 50's) but otherwise his vital signs were unremarkable. His ECG was unchanged compared to baseline and was without ischemic changes. His initial troponin was normal. He was given SLN without any change in his symptoms. Cardiology was consulted for evaluation.   On my assessment, the patient describes continued right sided chest and abdominal discomfort that "feels like he rubbed icy/hot onto his skin." The other symptoms of diaphoresis have resolved. He denies having similar symptoms to to the ones that he has several years ago when he was first  diagnosed with CAD. He notable has never had an MI, but at the time of diagnosis he was having severe exertional fatigue (which he is not having currently).   The patient has been taking all of his medications as prescribed. He drinks alcohol fairly heavily (4 to 6 beers per day) but did not drink today. He has never had withdrawal symptoms and can go a day without drinking without issue. He occasionally uses marijuana but did not use this today. He uses no other illicit substances.    Heart Pathway Score:     Past Medical History:  Diagnosis Date  . Arthritis   . Bursitis of right hip   . Chest pain    a. 1/2/019 Ex [facility]): Ex time 8:00 No ecg changes. Large severe apical rev defect and small mod sev apicoseptal rev defect. EF 42%.  . Coronary artery disease   . Hyperlipidemia   . Hypothyroidism   . Palpitations    a. premature atrial contractions.  . Pulmonary nodule    a. 08/2017 CXR - [facility]: "pulm nodule posterior lung base on lateral film, rec noncontrast chest CT for further eval.  . Seizures (HCC)    "only related to low blood sugars" (09/25/2017)  . Type I diabetes mellitus (HCC)    a. Dx age 56;  b. Most recent A1c 6.3.    Past Surgical History:  Procedure Laterality Date  . CORONARY ARTERY BYPASS GRAFT N/A 10/01/2017   Procedure: CORONARY ARTERY BYPASS  GRAFTING (CABG) x5 using the left internal mammary artery, the right greater saphenous vein harvested endoscopically, and the left radial artery.;  Surgeon: Loreli SlotHendrickson, Steven C, MD;  [facility];  Service: Open Heart Surgery;  Laterality: N/A;  . I and D of groin  2017   for MRSA infection  . KNEE ARTHROSCOPY Right 1990s  . LEFT HEART CATH AND CORONARY ANGIOGRAPHY N/A 09/26/2017   Procedure: LEFT HEART CATH AND CORONARY ANGIOGRAPHY;  Surgeon: Tonny Bollmanooper, Michael, MD;  [facility]VAS[facility] CV LAB;  Service: Cardiovascular;  Laterality: N/A;  . RADIAL ARTERY HARVEST Left 10/01/2017   Procedure: RADIAL ARTERY  HARVEST;  Surgeon: Loreli SlotHendrickson, Steven C, MD;  [facility];  Service: Open Heart Surgery;  Laterality: Left;  . TEE WITHOUT CARDIOVERSION N/A 10/01/2017   Procedure: TRANSESOPHAGEAL ECHOCARDIOGRAM (TEE);  Surgeon: Loreli SlotHendrickson, Steven C, MD;  [facility];  Service: Open Heart Surgery;  Laterality: N/A;     Medications Prior to Admission: Prior to Admission medications   Medication Sig Start Date End Date Taking? Authorizing Provider  aspirin EC 81 MG tablet Take 1 tablet (81 mg total) by mouth daily. 01/04/18   Georgeanna LeaKrasowski, Robert J, MD  carvedilol (COREG) 12.5 MG tablet Take 1 tablet (12.5 mg total) by mouth 2 (two) times daily. 01/22/18 03/09/20  Baldo DaubMunley, Brian J, MD  insulin glargine (LANTUS) 100 UNIT/ML injection Inject 25 Units into the skin at bedtime.     [provider]  insulin regular (NOVOLIN R) 100 units/mL injection Inject into the skin 3 (three) times daily before meals.    [provider]  levothyroxine (SYNTHROID) 112 MCG tablet Take 112 mcg by mouth daily.  09/07/17   [provider]  lisinopril (PRINIVIL,ZESTRIL) 10 MG tablet Take 1 tablet (10 mg total) by mouth daily. 01/29/18 03/09/20  Georgeanna LeaKrasowski, Robert J, MD  rosuvastatin (CRESTOR) 40 MG tablet Take 40 mg by mouth daily.    [provider]     Allergies:   No Known Allergies  Social History:   Social History   Socioeconomic History  . Marital status: Married    Spouse name: Not on file  . Number of children: Not on file  . Years of education: Not on file  . Highest education level: Not on file  Occupational History  . 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  Tobacco Use  . Smoking status: Never Smoker  . Smokeless tobacco: Current User    Types: Chew, Snuff  Substance and Sexual Activity  . Alcohol use: Yes    Alcohol/week: 28.0 standard drinks    Types: 28 Cans of beer per week    Comment: 09/25/2017 "2, 25oz beers/night" - has stopped as of   09/24/17  . Drug use: No  . Sexual activity: Not on file  Other Topics Concern  . Not on file  Social History Narrative   [place] in [place] with his wife and their children.   Social Determinants of Health   Financial Resource Strain:   . Difficulty of Paying Living Expenses:   Food Insecurity:   . Worried About Programme researcher, broadcasting/film/videounning Out of Food in the Last Year:   . Baristaan Out of Food in the Last Year:   Transportation Needs:   . Freight forwarderLack of Transportation (Medical):   Marland Kitchen. Lack of Transportation (Non-Medical):   Physical Activity:   . Days of Exercise per Week:   . Minutes of Exercise per Session:   Stress:   . Feeling of  Stress :   Social Connections:   . Frequency of Communication with Friends and Family:   . Frequency of Social Gatherings with Friends and Family:   . Attends Religious Services:   . Active Member of Clubs or Organizations:   . Attends Archivist Meetings:   Marland Kitchen Marital Status:   Intimate Partner Violence:   . Fear of Current or Ex-Partner:   . Emotionally Abused:   Marland Kitchen Physically Abused:   . Sexually Abused:     Family History:   The patient's family history includes Diabetes Mellitus I in his brother; Heart attack in his father.    ROS:  Please see the history of present illness.  All other ROS reviewed and negative.     Physical Exam/Data:   Vitals:   12/27/19 1622 12/27/19 1853  BP: (!) 153/94 138/87  Pulse: 67 (!) 122  Resp: (!) 22 18  Temp: 97.7 F (36.5 C)   TempSrc: Oral   SpO2: 100% 99%   No intake or output data in the 24 hours ending 12/27/19 1925 Last 3 Weights 10/23/2019 08/25/2019 06/23/2019  Weight (lbs) 198 lb 12.8 oz 190 lb 184 lb 12.8 oz  Weight (kg) 90.175 kg 86.183 kg 83.825 kg     There is no height or weight on file to calculate BMI.  General:  Well nourished, well developed, in no acute distress, somewhat anxious HEENT: normal Lymph: no adenopathy Neck: no JVD Endocrine:  No thryomegaly Vascular: No carotid bruits; FA pulses 2+  bilaterally without bruits  Cardiac:  normal S1, S2; RRR; no murmur, healed sternotomy wound Lungs:  clear to auscultation bilaterally, no wheezing, rhonchi or rales  Abd: soft, nontender, no hepatomegaly  Ext: no edema Musculoskeletal:  No deformities, BUE and BLE strength normal and equal Skin: warm and dry  Neuro:  CNs 2-12 intact, no focal abnormalities noted Psych:  Normal affect   EKG:  The ECG that was done on arrival to the ED was personally reviewed and demonstrates NSR, frequent PAC's, no ST or T wave changes concerning for ischemia, no Q waves  Relevant CV Studies: Echo from 04/2019 1. Left ventricular ejection fraction, by visual estimation, is 55 to  60%. The left ventricle has normal function. Normal left ventricular size.  There is no left ventricular hypertrophy.  2. Global right ventricle looks normal visually (despite the low  TASPE).The right ventricular size is normal. No increase in right  ventricular wall thickness.  3. Left atrial size was normal.  4. Right atrial size was normal.  5. The mitral valve is normal in structure. Trace mitral valve  regurgitation. No evidence of mitral stenosis.  6. The tricuspid valve is normal in structure. Tricuspid valve  regurgitation was not visualized by color flow Doppler.  7. The aortic valve is normal in structure. Aortic valve regurgitation  was not visualized by color flow Doppler. Structurally normal aortic  valve, with no evidence of sclerosis or stenosis.  8. The pulmonic valve was normal in structure. Pulmonic valve  regurgitation is not visualized by color flow Doppler.  9. Normal pulmonary artery systolic pressure.  10. The inferior vena cava is normal in size with greater than 50%  respiratory variability, suggesting right atrial pressure of 3 mmHg.   Cath from 09/2017  Mid RCA lesion is 80% stenosed.  Post Atrio-1 lesion is 80% stenosed.  Post Atrio-2 lesion is 90% stenosed.  Ost Cx to Prox Cx  lesion is 80% stenosed.  Prox Cx to  Mid Cx lesion is 90% stenosed.  Ost 2nd Mrg lesion is 50% stenosed.  Ost 1st Diag to 1st Diag lesion is 95% stenosed.  Prox LAD lesion is 100% stenosed.  The left ventricular systolic function is normal.  LV end diastolic pressure is normal.  The left ventricular ejection fraction is 55-65% by visual estimate.   1. Severe multivessel CAD with total occlusion of the mid-LAD, severe stenosis of the RCA, and severe stenosis of the left circumflex 2. Normal LV function with LVEF estimated at 60%, normal LVEDP   Laboratory Data:  High Sensitivity Troponin:   Recent Labs  Lab 12/27/19 1608  TROPONINIHS 11      Chemistry Recent Labs  Lab 12/27/19 1608  NA 142  K 3.6  CL 104  CO2 23  GLUCOSE 91  BUN <5*  CREATININE 0.66  CALCIUM 9.8  GFRNONAA >60  GFRAA >60  ANIONGAP 15    No results for input(s): PROT, ALBUMIN, AST, ALT, ALKPHOS, BILITOT in the last 168 hours. Hematology Recent Labs  Lab 12/27/19 1608  WBC 5.8  RBC 5.12  HGB 15.8  HCT 45.3  MCV 88.5  MCH 30.9  MCHC 34.9  RDW 12.4  PLT 289   BNPNo results for input(s): BNP, PROBNP in the last 168 hours.  DDimer No results for input(s): DDIMER in the last 168 hours.   Radiology/Studies:  DG Chest 2 View  Result Date: 12/27/2019 CLINICAL DATA:  Chest pain EXAM: CHEST - 2 VIEW COMPARISON:  03/05/2019 FINDINGS: Cardiac shadows within normal limits. Postsurgical changes are again seen. The lungs are clear. No focal infiltrate is noted. No bony abnormality is seen. IMPRESSION: No active cardiopulmonary disease. Electronically Signed   By: Alcide Clever M.D.   On: 12/27/2019 16:33       HEAR Score (for undifferentiated chest pain):  2 (LOW RISK)  Assessment and Plan:  Louis Byrd is a 44 y.o. male with DM1, hypothyroidism, HTN, and severe multivessel CAD s/p CABG in 2019 who presents with diaphoresis and CP.   The patient's chest discomfort is atypical and is not  consistent with the symptoms that previously led to his diagnosis of severe CAD. His ECG is very reassuring and his troponin is negative x 2. His HEAR score is 2, indicating low risk for myocardial ischemia. Furthermore his symptoms would be very atypical for acute PE. The patient states he has had similar symptoms in the past when his thyroid levels are abnormal, thus checking his TSH would be reasonable. He had a normal blood glucose at the time of his symptom onset. The patient does appear to have significant anxiety, which also may be playing a role. He also states he has a diagnosis of peptic ulcer disease, which is a potential cause for the epigastric discomfort (particularly after eating a pizza earlier today).   Overall given the multiple reassuring factors regarding this patient's case, it is reasonable for him to be discharge with close monitoring. He should understand that he should return to the ED if his symptoms worsen or if he develops any new symptoms. He is on excellent medical therapy for his CAD and he does not appear to have any additional room for increased beta blockade or anti-hypertensive medications.   I will send a message to the patient's cardiologist, Dr. Bing Matter, to help facilitate short interval follow up.    For questions or updates, please contact [facility] Please consult [email or web address] for contact info under  Signed, Rosario Jacks, MD  12/27/2019 7:25 PM

## 2020-01-01 MED ORDER — CARVEDILOL 6.25 MG PO TABS: 6.2500 mg | ORAL_TABLET | Freq: Two times a day (BID) | ORAL | 2 refills | Status: DC

## 2020-01-09 DIAGNOSIS — Z79899 Other long term (current) drug therapy: Secondary | ICD-10-CM

## 2020-01-20 LAB — BASIC METABOLIC PANEL
BUN/Creatinine Ratio: 12 (ref 9–20)
BUN: 9 mg/dL (ref 6–24)
CO2: 26 mmol/L (ref 20–29)
Calcium: 9.5 mg/dL (ref 8.7–10.2)
Chloride: 93 mmol/L — ABNORMAL LOW (ref 96–106)
Creatinine, Ser: 0.75 mg/dL — ABNORMAL LOW (ref 0.76–1.27)
GFR calc Af Amer: 130 mL/min/{1.73_m2} (ref >59)
GFR calc non Af Amer: 112 mL/min/{1.73_m2} (ref >59)
Glucose: 222 mg/dL — ABNORMAL HIGH (ref 65–99)
Potassium: 4.6 mmol/L (ref 3.5–5.2)
Sodium: 134 mmol/L (ref 134–144)

## 2020-01-26 ENCOUNTER — Telehealth: Payer: Self-pay | Admitting: Cardiology

## 2020-01-26 NOTE — Telephone Encounter (Signed)
Patient informed of results please see mychart encounter for further information.

## 2020-01-26 NOTE — Telephone Encounter (Signed)
Left message for patient to return call.

## 2020-01-26 NOTE — Telephone Encounter (Signed)
Patient returned call for his lab results.  

## 2020-01-27 NOTE — Addendum Note (Signed)
Addended by: Lita Mains on: 01/27/2020 05:20 PM   Modules accepted: Orders

## 2020-03-05 ENCOUNTER — Telehealth: Payer: Self-pay | Admitting: Cardiology

## 2020-03-05 NOTE — Telephone Encounter (Signed)
Pt c/o medication issue:  1. Name of Medication: carvedilol (COREG) 6.25 MG tablet lisinopril (PRINIVIL,ZESTRIL) 10 MG tablet 2. How are you currently taking this medication (dosage and times per day)?   3. Are you having a reaction (difficulty breathing--STAT)?   4. What is your medication issue? [facility] is calling stating [place] reached out to their office requesting refill on these medication, but stated Dr. Bing Matter had changed the dosage of both. [facility] Journey is requesting the notes of these medication changes be faxed over to their office so they can send in a prescription. The fax number is [phone]. Please advise.

## 2020-03-05 NOTE — Telephone Encounter (Signed)
Documentation has been faxed. 

## 2020-03-29 ENCOUNTER — Ambulatory Visit: Payer: Self-pay | Admitting: Cardiology

## 2020-03-30 ENCOUNTER — Other Ambulatory Visit: Payer: Self-pay

## 2020-03-30 ENCOUNTER — Ambulatory Visit (INDEPENDENT_AMBULATORY_CARE_PROVIDER_SITE_OTHER): Payer: Self-pay | Admitting: Cardiology

## 2020-03-30 ENCOUNTER — Encounter: Payer: Self-pay | Admitting: Cardiology

## 2020-03-30 VITALS — BP 138/82 | HR 68 | Ht 70.0 in | Wt 189.6 lb

## 2020-03-30 DIAGNOSIS — R0789 Other chest pain: Secondary | ICD-10-CM

## 2020-03-30 DIAGNOSIS — E781 Pure hyperglyceridemia: Secondary | ICD-10-CM

## 2020-03-30 DIAGNOSIS — I2510 Atherosclerotic heart disease of native coronary artery without angina pectoris: Secondary | ICD-10-CM

## 2020-03-30 DIAGNOSIS — I10 Essential (primary) hypertension: Secondary | ICD-10-CM

## 2020-03-30 DIAGNOSIS — Z951 Presence of aortocoronary bypass graft: Secondary | ICD-10-CM

## 2020-03-30 MED ORDER — ROSUVASTATIN CALCIUM 20 MG PO TABS
20.0000 mg | ORAL_TABLET | Freq: Every day | ORAL | 1 refills | Status: DC
Start: 2020-03-30 — End: 2023-03-20

## 2020-03-30 NOTE — Progress Notes (Signed)
Cardiology Office Note:    Date:  03/30/2020   ID:  Louis Byrd, DOB 1976-08-12, MRN [id number]  PCP:  Dema Severin, NP  Cardiologist:  Gypsy Balsam, MD    Referring MD: Dema Severin, NP   No chief complaint on file. I have some peptic ulcer disease  History of Present Illness:    Louis Byrd is a 44 y.o. male with past medical history significant for coronary artery disease, coronary to bypass graft only 2019x5, diabetes, dyslipidemia.  Comes today to my office for follow-up.  In May he ended up going to the emergency room the reason for that.  Pressure in the chest that he had with some sweating.  It was felt to be atypical.  It was also felt to be related to recent changes in his thyroid medication.  He was sent home.  He still complain of having some what he said also the pain.  He described to have some uneasy sensation in the upper epigastrium that happened typically when he eats something upright like pizza.  But that also can happen when he walks.  He did not try nitroglycerin for it.  Otherwise seems to be doing well.  Past Medical History:  Diagnosis Date  . Arthritis   . Bursitis of right hip   . Chest pain    a. 1/2/019 Ex [facility]): Ex time 8:00 No ecg changes. Large severe apical rev defect and small mod sev apicoseptal rev defect. EF 42%.  . Coronary artery disease   . Hyperlipidemia   . Hypothyroidism   . Palpitations    a. premature atrial contractions.  . Pulmonary nodule    a. 08/2017 CXR - [facility]: "pulm nodule posterior lung base on lateral film, rec noncontrast chest CT for further eval.  . Seizures (HCC)    "only related to low blood sugars" (09/25/2017)  . Type I diabetes mellitus (HCC)    a. Dx age 27;  b. Most recent A1c 6.3.    Past Surgical History:  Procedure Laterality Date  . CORONARY ARTERY BYPASS GRAFT N/A 10/01/2017   Procedure: CORONARY ARTERY BYPASS GRAFTING (CABG) x5 using the left internal mammary artery, the right greater  saphenous vein harvested endoscopically, and the left radial artery.;  Surgeon: Loreli Slot, MD;  Location: [facility] OR;  Service: Open Heart Surgery;  Laterality: N/A;  . I and D of groin  2017   for MRSA infection  . KNEE ARTHROSCOPY Right 1990s  . LEFT HEART CATH AND CORONARY ANGIOGRAPHY N/A 09/26/2017   Procedure: LEFT HEART CATH AND CORONARY ANGIOGRAPHY;  Surgeon: Tonny Bollman, MD;  Location: [facility] INVASIVE CV LAB;  Service: Cardiovascular;  Laterality: N/A;  . RADIAL ARTERY HARVEST Left 10/01/2017   Procedure: RADIAL ARTERY HARVEST;  Surgeon: Loreli Slot, MD;  Location: [facility] OR;  Service: Open Heart Surgery;  Laterality: Left;  . TEE WITHOUT CARDIOVERSION N/A 10/01/2017   Procedure: TRANSESOPHAGEAL ECHOCARDIOGRAM (TEE);  Surgeon: Loreli Slot, MD;  Location: [facility] OR;  Service: Open Heart Surgery;  Laterality: N/A;    Current Medications: Current Meds  Medication Sig  . aspirin EC 81 MG tablet Take 1 tablet (81 mg total) by mouth daily.  . carvedilol (COREG) 6.25 MG tablet Take 1 tablet (6.25 mg total) by mouth 2 (two) times daily.  Marland Kitchen HUMALOG 100 UNIT/ML injection Inject 3-12 Units into the skin daily as needed.  . Insulin Glargine (BASAGLAR KWIKPEN) 100 UNIT/ML Inject 20 Units into the skin  daily.   . lisinopril (ZESTRIL) 10 MG tablet Take 20 mg by mouth daily.  Marland Kitchen omeprazole (PRILOSEC) 20 MG capsule Take 40 mg by mouth in the morning and at bedtime.  Marland Kitchen SYNTHROID 125 MCG tablet Take 125 mcg by mouth daily.     Allergies:   Patient has no known allergies.   Social History   Socioeconomic History  . Marital status: Married    Spouse name: Not on file  . Number of children: Not on file  . Years of education: Not on file  . Highest education level: Not on file  Occupational History  . Occupation: Armed forces training and education officer    Comment: does some heavy exertion.  Tobacco Use  . Smoking status: Never Smoker  . Smokeless tobacco: Current User    Types: Chew, Snuff    Vaping Use  . Vaping Use: Never used  Substance and Sexual Activity  . Alcohol use: Yes    Alcohol/week: 28.0 standard drinks    Types: 28 Cans of beer per week    Comment: 09/25/2017 "2, 25oz beers/night" - has stopped as of  09/24/17  . Drug use: No  . Sexual activity: Not on file  Other Topics Concern  . Not on file  Social History Narrative   Lives in [place] with his wife and their children.   Social Determinants of Health   Financial Resource Strain:   . Difficulty of Paying Living Expenses:   Food Insecurity:   . Worried About Programme researcher, broadcasting/film/video in the Last Year:   . Barista in the Last Year:   Transportation Needs:   . Freight forwarder (Medical):   Marland Kitchen Lack of Transportation (Non-Medical):   Physical Activity:   . Days of Exercise per Week:   . Minutes of Exercise per Session:   Stress:   . Feeling of Stress :   Social Connections:   . Frequency of Communication with Friends and Family:   . Frequency of Social Gatherings with Friends and Family:   . Attends Religious Services:   . Active Member of Clubs or Organizations:   . Attends Banker Meetings:   Marland Kitchen Marital Status:      Family History: The patient's family history includes Diabetes Mellitus I in his brother; Heart attack in his father. ROS:   Please see the history of present illness.    All 14 point review of systems negative except as described per history of present illness  EKGs/Labs/Other Studies Reviewed:      Recent Labs: 06/23/2019: ALT 14 12/27/2019: Hemoglobin 15.8; Platelets 289; TSH 1.009 01/20/2020: BUN 9; Creatinine, Ser 0.75; Potassium 4.6; Sodium 134  Recent Lipid Panel    Component Value Date/Time   CHOL 158 01/29/2018 0845   TRIG 71 01/29/2018 0845   HDL 66 01/29/2018 0845   CHOLHDL 2.4 01/29/2018 0845   LDLCALC 78 01/29/2018 0845    Physical Exam:    VS:  BP 138/82   Pulse 68   Ht 5\' 10"  (1.778 m)   Wt 189 lb 9.6 oz (86 kg)   SpO2 98%   BMI  27.20 kg/m     Wt Readings from Last 3 Encounters:  03/30/20 189 lb 9.6 oz (86 kg)  10/23/19 198 lb 12.8 oz (90.2 kg)  08/25/19 190 lb (86.2 kg)     GEN:  Well nourished, well developed in no acute distress HEENT: Normal NECK: No JVD; No carotid bruits LYMPHATICS: No lymphadenopathy CARDIAC: RRR, no  murmurs, no rubs, no gallops RESPIRATORY:  Clear to auscultation without rales, wheezing or rhonchi  ABDOMEN: Soft, non-tender, non-distended MUSCULOSKELETAL:  No edema; No deformity  SKIN: Warm and dry LOWER EXTREMITIES: no swelling NEUROLOGIC:  Alert and oriented x 3 PSYCHIATRIC:  Normal affect   ASSESSMENT:    1. S/P CABG x 5   2. Pure hypertriglyceridemia   3. Essential hypertension   4. Coronary artery disease involving native coronary artery of native heart without angina pectoris   5. Atypical chest pain    PLAN:    In order of problems listed above:  1. Coronary artery disease, status post coronary bypass graft.  Does have some symptoms that are atypical but worrisome.  I will ask him to have a Lexiscan done. 2. Dyslipidemia for some reason he is not on statin I will put him back on Crestor like he was before, will check his fasting lipid profile as well as AST ALT within next few weeks. 3. Essential hypertension blood pressure well controlled continue present management. 4. Atypical chest pain again stress test will be done as planned above. 5. Hypothyroidism: At that being addressed by primary care physician.   Medication Adjustments/Labs and Tests Ordered: Current medicines are reviewed at length with the patient today.  Concerns regarding medicines are outlined above.  No orders of the defined types were placed in this encounter.  Medication changes: No orders of the defined types were placed in this encounter.   Signed, Georgeanna Lea, MD, [facility] 03/30/2020 9:53 AM     [facility]

## 2020-03-30 NOTE — Patient Instructions (Signed)
Medication Instructions:  Your physician has recommended you make the following change in your medication:   START: crestor 20 mg daily   *If you need a refill on your cardiac medications before your next appointment, please call your pharmacy*   Lab Work: Your physician recommends that you return for lab work in 6 weeks: lipid, ast alt  If you have labs (blood work) drawn today and your tests are completely normal, you will receive your results only by: . MyChart Message (if you have MyChart) OR . A paper copy in the mail If you have any lab test that is abnormal or we need to change your treatment, we will call you to review the results.   Testing/ProcedMarland Kitchenures:   [facility] [address] [place], [place] [address] Phone:  [phone]    Please arrive 15 minutes prior to your appointment time for registration and insurance purposes.  The test will take approximately 3 to 4 hours to complete; you may bring reading material.  If someone comes with you to your appointment, they will need to remain in the main lobby due to limited space in the testing area. **If you are pregnant or breastfeeding, please notify the nuclear lab prior to your appointment**  How to prepare for your Myocardial Perfusion Test: . Do not eat or drink 3 hours prior to your test, except you may have water. . Do not consume products containing caffeine (regular or decaffeinated) 12 hours prior to your test. (ex: coffee, chocolate, sodas, tea). . Do bring a list of your current medications with you.  If not listed below, you may take your medications as normal. . HOLD diabetic medication/insulin the morning of the test. . Do wear comfortable clothes (no dresses or overalls) and walking shoes, tennis shoes preferred (No heels or open toe shoes are allowed). . Do NOT wear cologne, perfume, aftershave, or lotions (deodorant is allowed). . If these instructions are not followed, your test  will have to be rescheduled.  Please report to [address] for your test.  If you have questions or concerns about your appointment, you can call the [facility] at [phone].  If you cannot keep your appointment, please provide 24 hours notification to the Nuclear Lab, to avoid a possible $50 charge to your account.    Follow-Up: At [facility], you and your health needs are our priority.  As part of our continuing mission to provide you with exceptional heart care, we have created designated Provider Care Teams.  These Care Teams include your primary Cardiologist (physician) and Advanced Practice Providers (APPs -  Physician Assistants and Nurse Practitioners) who all work together to provide you with the care you need, when you need it.  We recommend signing up for the patient portal called "MyChart".  Sign up information is provided on this After Visit Summary.  MyChart is used to connect with patients for Virtual Visits (Telemedicine).  Patients are able to view lab/test results, encounter notes, upcoming appointments, etc.  Non-urgent messages can be sent to your provider as well.   To learn more about what you can do with MyChart, go to [email or web address].    Your next appointment:   3 month(s)  The format for your next appointment:   In Person  Provider:   Gypsy Balsamobert Krasowski, MD   Other Instructions   Cardiac Nuclear Scan A cardiac nuclear scan is a test that measures blood flow to the heart  when a person is resting and when he or she is exercising. The test looks for problems such as:  Not enough blood reaching a portion of the heart.  The heart muscle not working normally. You may need this test if:  You have heart disease.  You have had abnormal lab results.  You have had heart surgery or a balloon procedure to open up blocked arteries (angioplasty).  You have chest pain.  You have shortness of breath. In this  test, a radioactive dye (tracer) is injected into your bloodstream. After the tracer has traveled to your heart, an imaging device is used to measure how much of the tracer is absorbed by or distributed to various areas of your heart. This procedure is usually done at a hospital and takes 2-4 hours. Tell a health care provider about:  Any allergies you have.  All medicines you are taking, including vitamins, herbs, eye drops, creams, and over-the-counter medicines.  Any problems you or family members have had with anesthetic medicines.  Any blood disorders you have.  Any surgeries you have had.  Any medical conditions you have.  Whether you are pregnant or may be pregnant. What are the risks? Generally, this is a safe procedure. However, problems may occur, including:  Serious chest pain and heart attack. This is only a risk if the stress portion of the test is done.  Rapid heartbeat.  Sensation of warmth in your chest. This usually passes quickly.  Allergic reaction to the tracer. What happens before the procedure?  Ask your health care provider about changing or stopping your regular medicines. This is especially important if you are taking diabetes medicines or blood thinners.  Follow instructions from your health care provider about eating or drinking restrictions.  Remove your jewelry on the day of the procedure. What happens during the procedure?  An IV will be inserted into one of your veins.  Your health care provider will inject a small amount of radioactive tracer through the IV.  You will wait for 20-40 minutes while the tracer travels through your bloodstream.  Your heart activity will be monitored with an electrocardiogram (ECG).  You will lie down on an exam table.  Images of your heart will be taken for about 15-20 minutes.  You may also have a stress test. For this test, one of the following may be done: ? You will exercise on a treadmill or stationary  bike. While you exercise, your heart's activity will be monitored with an ECG, and your blood pressure will be checked. ? You will be given medicines that will increase blood flow to parts of your heart. This is done if you are unable to exercise.  When blood flow to your heart has peaked, a tracer will again be injected through the IV.  After 20-40 minutes, you will get back on the exam table and have more images taken of your heart.  Depending on the type of tracer used, scans may need to be repeated 3-4 hours later.  Your IV line will be removed when the procedure is over. The procedure may vary among health care providers and hospitals. What happens after the procedure?  Unless your health care provider tells you otherwise, you may return to your normal schedule, including diet, activities, and medicines.  Unless your health care provider tells you otherwise, you may increase your fluid intake. This will help to flush the contrast dye from your body. Drink enough fluid to keep your urine pale  yellow.  Ask your health care provider, or the department that is doing the test: ? When will my results be ready? ? How will I get my results? Summary  A cardiac nuclear scan measures the blood flow to the heart when a person is resting and when he or she is exercising.  Tell your health care provider if you are pregnant.  Before the procedure, ask your health care provider about changing or stopping your regular medicines. This is especially important if you are taking diabetes medicines or blood thinners.  After the procedure, unless your health care provider tells you otherwise, increase your fluid intake. This will help flush the contrast dye from your body.  After the procedure, unless your health care provider tells you otherwise, you may return to your normal schedule, including diet, activities, and medicines. This information is not intended to replace advice given to you by your  health care provider. Make sure you discuss any questions you have with your health care provider. Document Revised: 01/14/2018 Document Reviewed: 01/14/2018 Elsevier Patient Education  2020 Elsevier Inc.  Rosuvastatin Tablets What is this medicine? ROSUVASTATIN (roe SOO va sta tin) is known as a HMG-CoA reductase inhibitor or 'statin'. It lowers cholesterol and triglycerides in the blood. This drug may also reduce the risk of heart attack, stroke, or other health problems in patients with risk factors for heart disease. Diet and lifestyle changes are often used with this drug. This medicine may be used for other purposes; ask your health care provider or pharmacist if you have questions. COMMON BRAND NAME(S): Crestor What should I tell my health care provider before I take this medicine? They need to know if you have any of these conditions:  diabetes  if you often drink alcohol  history of stroke  kidney disease  liver disease  muscle aches or weakness  thyroid disease  an unusual or allergic reaction to rosuvastatin, other medicines, foods, dyes, or preservatives  pregnant or trying to get pregnant  breast-feeding How should I use this medicine? Take this medicine by mouth with a glass of water. Follow the directions on the prescription label. Do not cut, crush or chew this medicine. You can take this medicine with or without food. Take your doses at regular intervals. Do not take your medicine more often than directed. Talk to your pediatrician regarding the use of this medicine in children. While this drug may be prescribed for children as young as 8 years old for selected conditions, precautions do apply. Overdosage: If you think you have taken too much of this medicine contact a poison control center or emergency room at once. NOTE: This medicine is only for you. Do not share this medicine with others. What if I miss a dose? If you miss a dose, take it as soon as you can.  If your next dose is to be taken in less than 12 hours, then do not take the missed dose. Take the next dose at your regular time. Do not take double or extra doses. What may interact with this medicine? Do not take this medicine with any of the following medications:  herbal medicines like red yeast rice This medicine may also interact with the following medications:  alcohol  antacids containing aluminum hydroxide or magnesium hydroxide  cyclosporine  other medicines for high cholesterol  some medicines for HIV infection  warfarin This list may not describe all possible interactions. Give your health care provider a list of all the medicines,  herbs, non-prescription drugs, or dietary supplements you use. Also tell them if you smoke, drink alcohol, or use illegal drugs. Some items may interact with your medicine. What should I watch for while using this medicine? Visit your doctor or health care professional for regular check-ups. You may need regular tests to make sure your liver is working properly. Your health care professional may tell you to stop taking this medicine if you develop muscle problems. If your muscle problems do not go away after stopping this medicine, contact your health care professional. Do not become pregnant while taking this medicine. Women should inform their health care professional if they wish to become pregnant or think they might be pregnant. There is a potential for serious side effects to an unborn child. Talk to your health care professional or pharmacist for more information. Do not breast-feed an infant while taking this medicine. This medicine may increase blood sugar. Ask your healthcare provider if changes in diet or medicines are needed if you have diabetes. If you are going to need surgery or other procedure, tell your doctor that you are using this medicine. This drug is only part of a total heart-health program. Your doctor or a dietician can  suggest a low-cholesterol and low-fat diet to help. Avoid alcohol and smoking, and keep a proper exercise schedule. This medicine may cause a decrease in Co-Enzyme Q-10. You should make sure that you get enough Co-Enzyme Q-10 while you are taking this medicine. Discuss the foods you eat and the vitamins you take with your health care professional. What side effects may I notice from receiving this medicine? Side effects that you should report to your doctor or health care professional as soon as possible:  allergic reactions like skin rash, itching or hives, swelling of the face, lips, or tongue  confusion  joint pain  loss of memory  redness, blistering, peeling or loosening of the skin, including inside the mouth  signs and symptoms of high blood sugar such as being more thirsty or hungry or having to urinate more than normal. You may also feel very tired or have blurry vision.  signs and symptoms of muscle injury like dark urine; trouble passing urine or change in the amount of urine; unusually weak or tired; muscle pain or side or back pain  yellowing of the eyes or skin Side effects that usually do not require medical attention (report to your doctor or health care professional if they continue or are bothersome):  constipation  diarrhea  dizziness  gas  headache  nausea  stomach pain  trouble sleeping  upset stomach This list may not describe all possible side effects. Call your doctor for medical advice about side effects. You may report side effects to FDA at 1-800-FDA-1088. Where should I keep my medicine? Keep out of the reach of children. Store at room temperature between 20 and 25 degrees C (68 and 77 degrees F). Keep container tightly closed (protect from moisture). Throw away any unused medicine after the expiration date. NOTE: This sheet is a summary. It may not cover all possible information. If you have questions about this medicine, talk to your doctor,  pharmacist, or health care provider.  2020 Elsevier/Gold Standard (2018-05-23 08:25:08)

## 2020-04-13 ENCOUNTER — Telehealth (HOSPITAL_COMMUNITY): Payer: Self-pay | Admitting: *Deleted

## 2020-04-13 NOTE — Telephone Encounter (Signed)
Patient given detailed instructions per Myocardial Perfusion Study Information Sheet for the test on 04/15/20 Patient notified to arrive 15 minutes early and that it is imperative to arrive on time for appointment to keep from having the test rescheduled.  If you need to cancel or reschedule your appointment, please call the office within 24 hours of your appointment. . Patient verbalized understanding. Louis Byrd [name]    

## 2020-06-30 ENCOUNTER — Other Ambulatory Visit: Payer: Self-pay

## 2020-06-30 DIAGNOSIS — M1990 Unspecified osteoarthritis, unspecified site: Secondary | ICD-10-CM | POA: Insufficient documentation

## 2020-06-30 DIAGNOSIS — R079 Chest pain, unspecified: Secondary | ICD-10-CM | POA: Insufficient documentation

## 2020-06-30 DIAGNOSIS — R002 Palpitations: Secondary | ICD-10-CM | POA: Insufficient documentation

## 2020-06-30 DIAGNOSIS — R569 Unspecified convulsions: Secondary | ICD-10-CM | POA: Insufficient documentation

## 2020-06-30 DIAGNOSIS — R911 Solitary pulmonary nodule: Secondary | ICD-10-CM | POA: Insufficient documentation

## 2020-06-30 DIAGNOSIS — M7071 Other bursitis of hip, right hip: Secondary | ICD-10-CM | POA: Insufficient documentation

## 2020-06-30 DIAGNOSIS — E109 Type 1 diabetes mellitus without complications: Secondary | ICD-10-CM | POA: Insufficient documentation

## 2020-07-01 ENCOUNTER — Ambulatory Visit: Payer: Self-pay | Admitting: Cardiology

## 2020-07-12 ENCOUNTER — Other Ambulatory Visit: Payer: Self-pay | Admitting: Cardiology

## 2020-07-12 NOTE — Telephone Encounter (Signed)
Rx refill sent to pharmacy. 

## 2020-10-14 ENCOUNTER — Other Ambulatory Visit: Payer: Self-pay | Admitting: Cardiology

## 2020-10-14 NOTE — Telephone Encounter (Signed)
Rx refill sent to pharmacy. Sent 6 month supply but then pt needs to be seen before any further refills

## 2021-04-22 ENCOUNTER — Other Ambulatory Visit: Payer: Self-pay | Admitting: Cardiology

## 2021-11-20 DIAGNOSIS — E1010 Type 1 diabetes mellitus with ketoacidosis without coma: Secondary | ICD-10-CM | POA: Diagnosis not present

## 2021-11-20 DIAGNOSIS — R112 Nausea with vomiting, unspecified: Secondary | ICD-10-CM | POA: Diagnosis not present

## 2021-11-23 DIAGNOSIS — E039 Hypothyroidism, unspecified: Secondary | ICD-10-CM | POA: Diagnosis not present

## 2021-11-23 DIAGNOSIS — E109 Type 1 diabetes mellitus without complications: Secondary | ICD-10-CM | POA: Diagnosis not present

## 2021-11-30 DIAGNOSIS — I2510 Atherosclerotic heart disease of native coronary artery without angina pectoris: Secondary | ICD-10-CM | POA: Diagnosis not present

## 2021-11-30 DIAGNOSIS — E109 Type 1 diabetes mellitus without complications: Secondary | ICD-10-CM | POA: Diagnosis not present

## 2021-11-30 DIAGNOSIS — E039 Hypothyroidism, unspecified: Secondary | ICD-10-CM | POA: Diagnosis not present

## 2021-12-14 ENCOUNTER — Encounter: Payer: Self-pay | Admitting: Cardiology

## 2021-12-22 DIAGNOSIS — E109 Type 1 diabetes mellitus without complications: Secondary | ICD-10-CM | POA: Diagnosis not present

## 2021-12-22 DIAGNOSIS — E039 Hypothyroidism, unspecified: Secondary | ICD-10-CM | POA: Diagnosis not present

## 2022-01-03 DIAGNOSIS — E039 Hypothyroidism, unspecified: Secondary | ICD-10-CM | POA: Diagnosis not present

## 2022-01-04 DIAGNOSIS — Z6826 Body mass index (BMI) 26.0-26.9, adult: Secondary | ICD-10-CM | POA: Diagnosis not present

## 2022-01-04 DIAGNOSIS — I2510 Atherosclerotic heart disease of native coronary artery without angina pectoris: Secondary | ICD-10-CM | POA: Diagnosis not present

## 2022-01-04 DIAGNOSIS — E039 Hypothyroidism, unspecified: Secondary | ICD-10-CM | POA: Diagnosis not present

## 2022-01-04 DIAGNOSIS — E109 Type 1 diabetes mellitus without complications: Secondary | ICD-10-CM | POA: Diagnosis not present

## 2022-01-30 DIAGNOSIS — R531 Weakness: Secondary | ICD-10-CM | POA: Diagnosis not present

## 2022-01-30 DIAGNOSIS — H538 Other visual disturbances: Secondary | ICD-10-CM | POA: Diagnosis not present

## 2022-01-30 DIAGNOSIS — Z794 Long term (current) use of insulin: Secondary | ICD-10-CM | POA: Diagnosis not present

## 2022-01-30 DIAGNOSIS — R5381 Other malaise: Secondary | ICD-10-CM | POA: Diagnosis not present

## 2022-01-30 DIAGNOSIS — E039 Hypothyroidism, unspecified: Secondary | ICD-10-CM | POA: Diagnosis not present

## 2022-01-30 DIAGNOSIS — R5383 Other fatigue: Secondary | ICD-10-CM | POA: Diagnosis not present

## 2022-01-30 DIAGNOSIS — I252 Old myocardial infarction: Secondary | ICD-10-CM | POA: Diagnosis not present

## 2022-01-30 DIAGNOSIS — F10280 Alcohol dependence with alcohol-induced anxiety disorder: Secondary | ICD-10-CM | POA: Diagnosis not present

## 2022-01-30 DIAGNOSIS — Z79899 Other long term (current) drug therapy: Secondary | ICD-10-CM | POA: Diagnosis not present

## 2022-01-30 DIAGNOSIS — I2510 Atherosclerotic heart disease of native coronary artery without angina pectoris: Secondary | ICD-10-CM | POA: Diagnosis not present

## 2022-01-30 DIAGNOSIS — I10 Essential (primary) hypertension: Secondary | ICD-10-CM | POA: Diagnosis not present

## 2022-01-30 DIAGNOSIS — R079 Chest pain, unspecified: Secondary | ICD-10-CM | POA: Diagnosis not present

## 2022-01-30 DIAGNOSIS — Z951 Presence of aortocoronary bypass graft: Secondary | ICD-10-CM | POA: Diagnosis not present

## 2022-01-30 DIAGNOSIS — E119 Type 2 diabetes mellitus without complications: Secondary | ICD-10-CM | POA: Diagnosis not present

## 2022-02-17 DIAGNOSIS — F1020 Alcohol dependence, uncomplicated: Secondary | ICD-10-CM | POA: Diagnosis not present

## 2022-02-27 DIAGNOSIS — F1010 Alcohol abuse, uncomplicated: Secondary | ICD-10-CM | POA: Diagnosis not present

## 2022-02-27 DIAGNOSIS — F1020 Alcohol dependence, uncomplicated: Secondary | ICD-10-CM | POA: Diagnosis not present

## 2022-03-02 DIAGNOSIS — F1223 Cannabis dependence with withdrawal: Secondary | ICD-10-CM | POA: Diagnosis not present

## 2022-03-02 DIAGNOSIS — F1020 Alcohol dependence, uncomplicated: Secondary | ICD-10-CM | POA: Diagnosis not present

## 2022-03-07 DIAGNOSIS — Z79899 Other long term (current) drug therapy: Secondary | ICD-10-CM | POA: Diagnosis not present

## 2022-03-07 DIAGNOSIS — R1110 Vomiting, unspecified: Secondary | ICD-10-CM | POA: Diagnosis not present

## 2022-03-07 DIAGNOSIS — R109 Unspecified abdominal pain: Secondary | ICD-10-CM | POA: Diagnosis not present

## 2022-03-07 DIAGNOSIS — F419 Anxiety disorder, unspecified: Secondary | ICD-10-CM | POA: Diagnosis not present

## 2022-03-07 DIAGNOSIS — R0789 Other chest pain: Secondary | ICD-10-CM | POA: Diagnosis not present

## 2022-03-07 DIAGNOSIS — R1010 Upper abdominal pain, unspecified: Secondary | ICD-10-CM | POA: Diagnosis not present

## 2022-03-07 DIAGNOSIS — R5383 Other fatigue: Secondary | ICD-10-CM | POA: Diagnosis not present

## 2022-03-07 DIAGNOSIS — R079 Chest pain, unspecified: Secondary | ICD-10-CM | POA: Diagnosis not present

## 2022-03-07 DIAGNOSIS — R0602 Shortness of breath: Secondary | ICD-10-CM | POA: Diagnosis not present

## 2022-03-16 DIAGNOSIS — F1020 Alcohol dependence, uncomplicated: Secondary | ICD-10-CM | POA: Diagnosis not present

## 2022-04-13 ENCOUNTER — Other Ambulatory Visit: Payer: Self-pay | Admitting: Cardiology

## 2022-04-13 DIAGNOSIS — F1020 Alcohol dependence, uncomplicated: Secondary | ICD-10-CM | POA: Diagnosis not present

## 2022-04-24 DIAGNOSIS — F1020 Alcohol dependence, uncomplicated: Secondary | ICD-10-CM | POA: Diagnosis not present

## 2023-03-18 DIAGNOSIS — R7989 Other specified abnormal findings of blood chemistry: Secondary | ICD-10-CM | POA: Diagnosis not present

## 2023-03-18 DIAGNOSIS — E119 Type 2 diabetes mellitus without complications: Secondary | ICD-10-CM | POA: Diagnosis not present

## 2023-03-18 DIAGNOSIS — E860 Dehydration: Secondary | ICD-10-CM | POA: Diagnosis not present

## 2023-03-18 DIAGNOSIS — I10 Essential (primary) hypertension: Secondary | ICD-10-CM | POA: Diagnosis not present

## 2023-03-18 DIAGNOSIS — I361 Nonrheumatic tricuspid (valve) insufficiency: Secondary | ICD-10-CM | POA: Diagnosis not present

## 2023-03-18 DIAGNOSIS — E1010 Type 1 diabetes mellitus with ketoacidosis without coma: Secondary | ICD-10-CM | POA: Diagnosis not present

## 2023-03-18 DIAGNOSIS — E785 Hyperlipidemia, unspecified: Secondary | ICD-10-CM | POA: Diagnosis not present

## 2023-03-18 DIAGNOSIS — I2510 Atherosclerotic heart disease of native coronary artery without angina pectoris: Secondary | ICD-10-CM | POA: Diagnosis not present

## 2023-03-18 DIAGNOSIS — I491 Atrial premature depolarization: Secondary | ICD-10-CM | POA: Diagnosis not present

## 2023-03-19 DIAGNOSIS — I2510 Atherosclerotic heart disease of native coronary artery without angina pectoris: Secondary | ICD-10-CM | POA: Diagnosis not present

## 2023-03-19 DIAGNOSIS — E785 Hyperlipidemia, unspecified: Secondary | ICD-10-CM | POA: Diagnosis not present

## 2023-03-19 DIAGNOSIS — I10 Essential (primary) hypertension: Secondary | ICD-10-CM | POA: Diagnosis not present

## 2023-03-19 DIAGNOSIS — E119 Type 2 diabetes mellitus without complications: Secondary | ICD-10-CM | POA: Diagnosis not present

## 2023-03-20 ENCOUNTER — Encounter (HOSPITAL_COMMUNITY): Payer: Self-pay

## 2023-03-20 ENCOUNTER — Inpatient Hospital Stay (HOSPITAL_COMMUNITY)
Admit: 2023-03-20 | Discharge: 2023-03-22 | DRG: 232 | Disposition: A | Discharge status: Home / Self Care | Payer: 59 | Source: Ambulatory Visit | Attending: Family Medicine | Admitting: Family Medicine

## 2023-03-20 DIAGNOSIS — E039 Hypothyroidism, unspecified: Secondary | ICD-10-CM | POA: Diagnosis present

## 2023-03-20 DIAGNOSIS — Z794 Long term (current) use of insulin: Secondary | ICD-10-CM

## 2023-03-20 DIAGNOSIS — Z833 Family history of diabetes mellitus: Secondary | ICD-10-CM | POA: Diagnosis not present

## 2023-03-20 DIAGNOSIS — E109 Type 1 diabetes mellitus without complications: Secondary | ICD-10-CM

## 2023-03-20 DIAGNOSIS — E119 Type 2 diabetes mellitus without complications: Secondary | ICD-10-CM | POA: Diagnosis not present

## 2023-03-20 DIAGNOSIS — F1090 Alcohol use, unspecified, uncomplicated: Secondary | ICD-10-CM | POA: Diagnosis present

## 2023-03-20 DIAGNOSIS — I10 Essential (primary) hypertension: Secondary | ICD-10-CM | POA: Diagnosis present

## 2023-03-20 DIAGNOSIS — E876 Hypokalemia: Secondary | ICD-10-CM | POA: Diagnosis not present

## 2023-03-20 DIAGNOSIS — Z885 Allergy status to narcotic agent status: Secondary | ICD-10-CM

## 2023-03-20 DIAGNOSIS — D696 Thrombocytopenia, unspecified: Secondary | ICD-10-CM | POA: Diagnosis present

## 2023-03-20 DIAGNOSIS — F1020 Alcohol dependence, uncomplicated: Secondary | ICD-10-CM | POA: Diagnosis not present

## 2023-03-20 DIAGNOSIS — F1290 Cannabis use, unspecified, uncomplicated: Secondary | ICD-10-CM | POA: Diagnosis present

## 2023-03-20 DIAGNOSIS — Z7982 Long term (current) use of aspirin: Secondary | ICD-10-CM | POA: Diagnosis not present

## 2023-03-20 DIAGNOSIS — Z79899 Other long term (current) drug therapy: Secondary | ICD-10-CM | POA: Diagnosis not present

## 2023-03-20 DIAGNOSIS — E1065 Type 1 diabetes mellitus with hyperglycemia: Secondary | ICD-10-CM | POA: Diagnosis present

## 2023-03-20 DIAGNOSIS — E1169 Type 2 diabetes mellitus with other specified complication: Secondary | ICD-10-CM | POA: Diagnosis present

## 2023-03-20 DIAGNOSIS — Z955 Presence of coronary angioplasty implant and graft: Principal | ICD-10-CM

## 2023-03-20 DIAGNOSIS — I214 Non-ST elevation (NSTEMI) myocardial infarction: Secondary | ICD-10-CM | POA: Diagnosis not present

## 2023-03-20 DIAGNOSIS — E1159 Type 2 diabetes mellitus with other circulatory complications: Secondary | ICD-10-CM | POA: Diagnosis present

## 2023-03-20 DIAGNOSIS — I152 Hypertension secondary to endocrine disorders: Secondary | ICD-10-CM | POA: Diagnosis present

## 2023-03-20 DIAGNOSIS — Z79890 Hormone replacement therapy (postmenopausal): Secondary | ICD-10-CM | POA: Diagnosis not present

## 2023-03-20 DIAGNOSIS — I252 Old myocardial infarction: Secondary | ICD-10-CM | POA: Diagnosis not present

## 2023-03-20 DIAGNOSIS — I25810 Atherosclerosis of coronary artery bypass graft(s) without angina pectoris: Secondary | ICD-10-CM | POA: Diagnosis not present

## 2023-03-20 DIAGNOSIS — G8929 Other chronic pain: Secondary | ICD-10-CM | POA: Diagnosis present

## 2023-03-20 DIAGNOSIS — R001 Bradycardia, unspecified: Secondary | ICD-10-CM | POA: Diagnosis present

## 2023-03-20 DIAGNOSIS — Z8249 Family history of ischemic heart disease and other diseases of the circulatory system: Secondary | ICD-10-CM | POA: Diagnosis not present

## 2023-03-20 DIAGNOSIS — I2510 Atherosclerotic heart disease of native coronary artery without angina pectoris: Secondary | ICD-10-CM | POA: Diagnosis present

## 2023-03-20 DIAGNOSIS — Z720 Tobacco use: Secondary | ICD-10-CM

## 2023-03-20 DIAGNOSIS — E785 Hyperlipidemia, unspecified: Secondary | ICD-10-CM | POA: Diagnosis not present

## 2023-03-20 LAB — CBC
HCT: 33 % — ABNORMAL LOW (ref 39.0–52.0)
Hemoglobin: 11.1 g/dL — ABNORMAL LOW (ref 13.0–17.0)
MCH: 30.7 pg (ref 26.0–34.0)
MCHC: 33.6 g/dL (ref 30.0–36.0)
MCV: 91.2 fL (ref 80.0–100.0)
Platelets: 108 10*3/uL — ABNORMAL LOW (ref 150–400)
RBC: 3.62 MIL/uL — ABNORMAL LOW (ref 4.22–5.81)
RDW: 12.3 % (ref 11.5–15.5)
WBC: 6.1 10*3/uL (ref 4.0–10.5)
nRBC: 0 % (ref 0.0–0.2)

## 2023-03-20 LAB — COMPREHENSIVE METABOLIC PANEL
ALT: 20 U/L (ref 0–44)
AST: 32 U/L (ref 15–41)
Albumin: 2.7 g/dL — ABNORMAL LOW (ref 3.5–5.0)
Alkaline Phosphatase: 60 U/L (ref 38–126)
Anion gap: 10 (ref 5–15)
BUN: <5 mg/dL — ABNORMAL LOW (ref 6–20)
CO2: 24 mmol/L (ref 22–32)
Calcium: 8.2 mg/dL — ABNORMAL LOW (ref 8.9–10.3)
Chloride: 102 mmol/L (ref 98–111)
Creatinine, Ser: 0.66 mg/dL (ref 0.61–1.24)
GFR, Estimated: >60 mL/min (ref >60)
Glucose, Bld: 128 mg/dL — ABNORMAL HIGH (ref 70–99)
Potassium: 3 mmol/L — ABNORMAL LOW (ref 3.5–5.1)
Sodium: 136 mmol/L (ref 135–145)
Total Bilirubin: 0.8 mg/dL (ref 0.3–1.2)
Total Protein: 5.5 g/dL — ABNORMAL LOW (ref 6.5–8.1)

## 2023-03-20 LAB — GLUCOSE, CAPILLARY
Glucose-Capillary: 113 mg/dL — ABNORMAL HIGH (ref 70–99)
Glucose-Capillary: 182 mg/dL — ABNORMAL HIGH (ref 70–99)

## 2023-03-20 LAB — PROTIME-INR
INR: 1.2 (ref 0.8–1.2)
Prothrombin Time: 15.6 seconds — ABNORMAL HIGH (ref 11.4–15.2)

## 2023-03-20 LAB — TROPONIN I (HIGH SENSITIVITY)
Troponin I (High Sensitivity): 2277 ng/L (ref <18)
Troponin I (High Sensitivity): 2262 ng/L

## 2023-03-20 LAB — APTT: aPTT: 175 seconds (ref 24–36)

## 2023-03-20 LAB — MAGNESIUM: Magnesium: 2 mg/dL (ref 1.7–2.4)

## 2023-03-20 MED ORDER — LISINOPRIL 10 MG PO TABS
10.0000 mg | ORAL_TABLET | Freq: Every day | ORAL | Status: DC
  Administered 2023-03-21 – 2023-03-22 (×2): 10 mg via ORAL
  Filled 2023-03-20 (×2): qty 1

## 2023-03-20 MED ORDER — INSULIN GLARGINE-YFGN 100 UNIT/ML ~~LOC~~ SOLN
12.0000 [IU] | Freq: Every day | SUBCUTANEOUS | Status: DC
  Administered 2023-03-21 – 2023-03-22 (×2): 12 [IU] via SUBCUTANEOUS
  Filled 2023-03-20 (×2): qty 0.12

## 2023-03-20 MED ORDER — ACETAMINOPHEN 650 MG RE SUPP: 650.0000 mg | Freq: Four times a day (QID) | RECTAL | Status: DC | PRN

## 2023-03-20 MED ORDER — HEPARIN BOLUS VIA INFUSION
4000.0000 [IU] | Freq: Once | INTRAVENOUS | Status: AC
  Administered 2023-03-20: 4000 [IU] via INTRAVENOUS
  Filled 2023-03-20: qty 4000

## 2023-03-20 MED ORDER — ONDANSETRON HCL 4 MG/2ML IJ SOLN
4.0000 mg | Freq: Four times a day (QID) | INTRAMUSCULAR | Status: DC | PRN
  Administered 2023-03-20 – 2023-03-21 (×2): 4 mg via INTRAVENOUS
  Filled 2023-03-20: qty 2

## 2023-03-20 MED ORDER — THIAMINE HCL 100 MG/ML IJ SOLN
100.0000 mg | Freq: Every day | INTRAMUSCULAR | Status: DC
  Filled 2023-03-20: qty 2

## 2023-03-20 MED ORDER — ONDANSETRON HCL 4 MG PO TABS: 4.0000 mg | ORAL_TABLET | Freq: Four times a day (QID) | ORAL | Status: DC | PRN

## 2023-03-20 MED ORDER — THIAMINE MONONITRATE 100 MG PO TABS
100.0000 mg | ORAL_TABLET | Freq: Every day | ORAL | Status: DC
  Administered 2023-03-20 – 2023-03-22 (×3): 100 mg via ORAL
  Filled 2023-03-20 (×3): qty 1

## 2023-03-20 MED ORDER — LORAZEPAM 1 MG PO TABS: 1.0000 mg | ORAL_TABLET | ORAL | Status: DC | PRN

## 2023-03-20 MED ORDER — SODIUM CHLORIDE 0.9% FLUSH
3.0000 mL | Freq: Two times a day (BID) | INTRAVENOUS | Status: DC
  Administered 2023-03-20 – 2023-03-22 (×3): 3 mL via INTRAVENOUS

## 2023-03-20 MED ORDER — LEVOTHYROXINE SODIUM 25 MCG PO TABS
137.0000 ug | ORAL_TABLET | Freq: Every day | ORAL | Status: DC
  Administered 2023-03-22: 137 ug via ORAL
  Filled 2023-03-20: qty 1

## 2023-03-20 MED ORDER — SENNOSIDES-DOCUSATE SODIUM 8.6-50 MG PO TABS: 1.0000 | ORAL_TABLET | Freq: Every evening | ORAL | Status: DC | PRN

## 2023-03-20 MED ORDER — INSULIN ASPART 100 UNIT/ML IJ SOLN
0.0000 [IU] | INTRAMUSCULAR | Status: DC
  Administered 2023-03-20 – 2023-03-22 (×6): 3 [IU] via SUBCUTANEOUS
  Administered 2023-03-22: 2 [IU] via SUBCUTANEOUS

## 2023-03-20 MED ORDER — HEPARIN (PORCINE) 25000 UT/250ML-% IV SOLN
1250.0000 [IU]/h | INTRAVENOUS | Status: DC
  Administered 2023-03-20: 1100 [IU]/h via INTRAVENOUS
  Filled 2023-03-20: qty 250

## 2023-03-20 MED ORDER — ENOXAPARIN SODIUM 40 MG/0.4ML IJ SOSY: 40.0000 mg | PREFILLED_SYRINGE | INTRAMUSCULAR | Status: DC

## 2023-03-20 MED ORDER — ADULT MULTIVITAMIN W/MINERALS CH
1.0000 | ORAL_TABLET | Freq: Every day | ORAL | Status: DC
  Administered 2023-03-20 – 2023-03-22 (×3): 1 via ORAL
  Filled 2023-03-20 (×3): qty 1

## 2023-03-20 MED ORDER — ACETAMINOPHEN 325 MG PO TABS
650.0000 mg | ORAL_TABLET | Freq: Four times a day (QID) | ORAL | Status: DC | PRN
  Administered 2023-03-21: 650 mg via ORAL
  Filled 2023-03-20: qty 2

## 2023-03-20 MED ORDER — FOLIC ACID 1 MG PO TABS
1.0000 mg | ORAL_TABLET | Freq: Every day | ORAL | Status: DC
  Administered 2023-03-20 – 2023-03-22 (×3): 1 mg via ORAL
  Filled 2023-03-20 (×3): qty 1

## 2023-03-20 MED ORDER — LORAZEPAM 2 MG/ML IJ SOLN: 1.0000 mg | INTRAMUSCULAR | Status: DC | PRN

## 2023-03-20 MED ORDER — POTASSIUM CHLORIDE 20 MEQ PO PACK
60.0000 meq | PACK | Freq: Once | ORAL | Status: AC
  Administered 2023-03-20: 60 meq via ORAL
  Filled 2023-03-20: qty 3

## 2023-03-20 MED ORDER — HYDROMORPHONE HCL 1 MG/ML IJ SOLN
0.5000 mg | INTRAMUSCULAR | Status: DC | PRN
  Administered 2023-03-20 – 2023-03-21 (×2): 0.5 mg via INTRAVENOUS
  Filled 2023-03-20: qty 1

## 2023-03-20 MED ORDER — ASPIRIN 81 MG PO TBEC
81.0000 mg | DELAYED_RELEASE_TABLET | Freq: Every day | ORAL | Status: DC
  Administered 2023-03-22: 81 mg via ORAL
  Filled 2023-03-20: qty 1

## 2023-03-20 MED ORDER — NITROGLYCERIN 2 % TD OINT
1.0000 [in_us] | TOPICAL_OINTMENT | Freq: Four times a day (QID) | TRANSDERMAL | Status: DC
  Administered 2023-03-20 – 2023-03-22 (×6): 1 [in_us] via TOPICAL
  Filled 2023-03-20 (×7): qty 1

## 2023-03-20 NOTE — H&P (Signed)
History and Physical    Louis Byrd ZOX:[id number] DOB: 01-20-1976 DOA: 03/20/2023  PCP: Erskine Emery, NP  Patient coming from: [facility]  I have personally briefly reviewed patient's old medical records in [facility]  Chief Complaint: Chest pain  HPI: Louis Byrd is a 47 y.o. male with medical history significant for CAD s/p CABG 10/2017, T1DM, HTN, HLD, hypothyroidism, alcohol use disorder who was transferred from [facility] for evaluation of NSTEMI.  Patient transferred from [facility] where he was admitted early morning 8/4.  He was initially admitted for management of DKA which has been treated and resolved.   During admission he was noted to have elevated troponin with peak troponin level 11.4 before trending down.  Echocardiogram 8/4 showed EF 55-60%, no regional wall motion abnormality.  He was seen by cardiology Dr. Bing Matter, who recommended transfer to [facility] for consideration of cardiac cath.  Per documentation they did discuss the case with cardiology here.  Patient states that he is having intermittent left-sided chest pain which radiates straight to his back just below his shoulder blade.  He has had some improvement with topical Nitropaste.  He had nausea and vomiting when initially admitted but this has resolved.  Patient does state that he is an alcoholic and drinks about 5-6 25 oz beers daily.  He says this is day 4 since his last drink.  He is feeling somewhat anxious.  He reports occasional marijuana use as well as using tobacco dip but denies smoking cigarettes.  He denies any cocaine or injection drug use.  Review of Systems: All systems reviewed and are negative except as documented in history of present illness above.   Past Medical History:  Diagnosis Date   Arthritis    Atypical chest pain 09/25/2017   Bursitis of right hip    Chest pain    a. 1/2/019 Ex [facility]): Ex time 8:00 No ecg changes. Large severe apical rev  defect and small mod sev apicoseptal rev defect. EF 42%.   Coronary artery disease    Diabetes (HCC) 09/25/2017   Essential hypertension 01/29/2018   Hyperlipidemia    Hypothyroidism    Palpitations    a. premature atrial contractions.   Pulmonary nodule    a. 08/2017 CXR - [facility]: "pulm nodule posterior lung base on lateral film, rec noncontrast chest CT for further eval.   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 Dx age 17;  b. Most recent A1c 6.3.   Unstable angina [facility])     Past Surgical History:  Procedure Laterality Date   CORONARY ARTERY BYPASS GRAFT N/A 10/01/2017   Procedure: CORONARY ARTERY BYPASS GRAFTING (CABG) x5 using the left internal mammary artery, the right greater saphenous vein harvested endoscopically, and the left radial artery.;  Surgeon: Loreli Slot, MD;  Location: [facility] OR;  Service: Open Heart Surgery;  Laterality: N/A;   I and D of groin  2017   for MRSA infection   KNEE ARTHROSCOPY Right 1990s   LEFT HEART CATH AND CORONARY ANGIOGRAPHY N/A 09/26/2017   Procedure: LEFT HEART CATH AND CORONARY ANGIOGRAPHY;  Surgeon: Tonny Bollman, MD;  Location: [facility] INVASIVE CV LAB;  Service: Cardiovascular;  Laterality: N/A;   RADIAL ARTERY HARVEST Left 10/01/2017   Procedure: RADIAL ARTERY HARVEST;  Surgeon: Charlett Lango  C, MD;  Location: [facility] OR;  Service: Open Heart Surgery;  Laterality: Left;   TEE WITHOUT CARDIOVERSION N/A 10/01/2017   Procedure: TRANSESOPHAGEAL ECHOCARDIOGRAM (TEE);  Surgeon: Loreli Slot, MD;  Location: [facility] OR;  Service: Open Heart Surgery;  Laterality: N/A;    Social History:  reports that he has never smoked. His smokeless tobacco use includes chew and snuff. He reports current alcohol use of about 28.0 standard drinks of alcohol per week. He reports that he does not use drugs.  Allergies  Allergen  Reactions   Morphine Nausea And Vomiting    Family History  Problem Relation Age of Onset   Heart attack Father    Diabetes Mellitus I Brother      Prior to Admission medications   Medication Sig Start Date End Date Taking? Authorizing Provider  aspirin EC 81 MG tablet Take 1 tablet (81 mg total) by mouth daily. 01/04/18  Yes Georgeanna Lea, MD  carvedilol (COREG) 6.25 MG tablet TAKE 1 Tablet  BY MOUTH TWICE DAILY (PATIENT NEEDS OFFICE VISIT) 04/22/21  Yes Georgeanna Lea, MD  fluticasone [facility]) 50 MCG/ACT nasal spray Place 1 spray into both nostrils at bedtime. 03/26/20  Yes [provider]  HUMALOG 100 UNIT/ML injection Inject 3-12 Units into the skin daily as needed. 12/19/19  Yes [provider]  Insulin Glargine (BASAGLAR KWIKPEN) 100 UNIT/ML Inject 20 Units into the skin daily.  12/19/19  Yes [provider]  lisinopril (ZESTRIL) 10 MG tablet Take 10 mg by mouth daily.   Yes [provider]  METOPROLOL SUCCINATE PO Take 1 tablet by mouth daily.   Yes [provider]  omeprazole (PRILOSEC) 20 MG capsule Take 40 mg by mouth in the morning and at bedtime.   Yes [provider]  SYNTHROID 137 MCG tablet Take 137 mcg by mouth daily. 03/12/23  Yes [provider]    Physical Exam: Vitals:   03/20/23 1700 03/20/23 2000  BP: (!) 141/92   Pulse: 60   Resp: 18   Temp: 98.1 F (36.7 C)   TempSrc: Oral   SpO2: 100%   Weight:  79.4 kg  Height:  5\' 9"  (1.753 m)   Constitutional: Resting supine in bed, NAD, calm, comfortable Eyes: EOMI, lids and conjunctivae normal ENMT: Mucous membranes are moist. Posterior pharynx clear of any exudate or lesions.Normal dentition.  Neck: normal, supple, no masses. Respiratory: clear to auscultation bilaterally, no wheezing, no crackles. Normal respiratory effort. No accessory muscle use.  Cardiovascular: Regular rate and rhythm, no murmurs / rubs / gallops. No extremity edema. 2+  pedal pulses. Abdomen: no tenderness, no masses palpated. Musculoskeletal: no clubbing / cyanosis. No joint deformity upper and lower extremities. Good ROM, no contractures. Normal muscle tone.  Skin: no rashes, lesions, ulcers. No induration Neurologic: Sensation intact. Strength 5/5 in all 4.  Psychiatric: Normal judgment and insight. Alert and oriented x 3. Normal mood.   EKG: Ordered and pending.  Assessment/Plan Principal Problem:   NSTEMI (non-ST elevated myocardial infarction) (HCC) Active Problems:   Coronary artery disease   Type 1 diabetes (HCC)   Alcohol use disorder   Hyperlipidemia associated with type 2 diabetes mellitus (HCC)   Hypothyroidism   Hypertension associated with diabetes (HCC)   Hypokalemia   Thrombocytopenia (HCC)   Louis Byrd is a 47 y.o. male with medical history significant for CAD s/p CABG 10/2017, T1DM, HTN, HLD, hypothyroidism, alcohol use disorder who is admitted with NSTEMI.  Assessment and Plan:  NSTEMI CAD s/p CABG 10/2017: Transfer from [facility] for NSTEMI.  Patient reporting continued intermittent chest pain on arrival.  Vitals currently stable.  TTE at OSH 8/4 with EF 55-60%, no regional wall motion abnormality noted. -Continue trending troponin 2277 >> -Start IV heparin -Continue aspirin 81 mg daily -Start nitroglycerin ointment -Discussed with cardiology, Dr. Geraldo Pitter who will see in consultation -Keep n.p.o. after midnight  Type 1 diabetes: Treated for DKA at [facility] prior to transfer which has since resolved.  Placed on Semglee 12 units daily and moderate SSI.  Adjust as needed.  Alcohol use disorder: Patient reports drinking 5-6 25 oz beers daily with last drink about 4 days prior to this admission.  He is somewhat anxious but mentating well. -Placed on CIWA protocol  Hypertension: Blood pressure stable.  Continue lisinopril 10 mg daily.  Hypokalemia: Home supplement ordered.  Check  magnesium.  Hyperlipidemia: Does not appear to be on daily statin.  Hypothyroidism: Continue Synthroid.  Thrombocytopenia: Mild without obvious bleeding.  Monitor while on IV heparin.   DVT prophylaxis: IV heparin Code Status: Full code Family Communication: Spouse at bedside Disposition Plan: From home and likely discharge to home pending clinical progress Consults called: Cardiology Severity of Illness: The appropriate patient status for this patient is INPATIENT. Inpatient status is judged to be reasonable and necessary in order to provide the required intensity of service to ensure the patient's safety. The patient's presenting symptoms, physical exam findings, and initial radiographic and laboratory data in the context of their chronic comorbidities is felt to place them at high risk for further clinical deterioration. Furthermore, it is not anticipated that the patient will be medically stable for discharge from the hospital within 2 midnights of admission.   * I certify that at the point of admission it is my clinical judgment that the patient will require inpatient hospital care spanning beyond 2 midnights from the point of admission due to high intensity of service, high risk for further deterioration and high frequency of surveillance required.Darreld Mclean MD [facility]  If 7PM-7AM, please contact night-coverage [email or web address]  03/20/2023, 8:58 PM

## 2023-03-20 NOTE — Progress Notes (Signed)
ANTICOAGULATION CONSULT NOTE - Initial Consult  Pharmacy Consult for Heparin Indication: chest pain/ACS  Allergies  Allergen Reactions   Morphine Nausea And Vomiting    Patient Measurements: Height: 5\' 9"  (175.3 cm) Weight: 79.4 kg (175 lb) IBW/kg (Calculated) : 70.7 Heparin Dosing Weight: total weight  Vital Signs: Temp: 98.1 F (36.7 C) (08/06 1700) Temp Source: Oral (08/06 1700) BP: 141/92 (08/06 1700) Pulse Rate: 60 (08/06 1700)  Labs: Recent Labs    03/20/23 1858  HGB 11.1*  HCT 33.0*  PLT 108*  CREATININE 0.66  TROPONINIHS 2,277*    Estimated Creatinine Clearance: 115.4 mL/min (by C-G formula based on SCr of 0.66 mg/dL).   Medical History: Past Medical History:  Diagnosis Date   Arthritis    Atypical chest pain 09/25/2017   Bursitis of right hip    Chest pain    a. 1/2/019 Ex [facility]): Ex time 8:00 No ecg changes. Large severe apical rev defect and small mod sev apicoseptal rev defect. EF 42%.   Coronary artery disease    Diabetes (HCC) 09/25/2017   Essential hypertension 01/29/2018   Hyperlipidemia    Hypothyroidism    Palpitations    a. premature atrial contractions.   Pulmonary nodule    a. 08/2017 CXR - [facility]: "pulm nodule posterior lung base on lateral film, rec noncontrast chest CT for further eval.   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 Dx age 57;  b. Most recent A1c 6.3.   Unstable angina (HCC)     Medications:  Scheduled:   [START ON 03/21/2023] aspirin EC  81 mg Oral Daily   folic acid  1 mg Oral Daily   insulin aspart  0-15 Units Subcutaneous Q4H   [START ON 03/21/2023] insulin glargine-yfgn  12 Units Subcutaneous Daily   [START ON 03/21/2023] levothyroxine  137 mcg Oral Q0600   [START ON 03/21/2023] lisinopril  10 mg Oral Daily   multivitamin with minerals  1 tablet Oral Daily   nitroGLYCERIN  1 inch  Topical Q6H   potassium chloride  60 mEq Oral Once   sodium chloride flush  3 mL Intravenous Q12H   thiamine  100 mg Oral Daily   Or   thiamine  100 mg Intravenous Daily   Infusions:  PRN: acetaminophen **OR** acetaminophen, HYDROmorphone (DILAUDID) injection, LORazepam **OR** LORazepam, ondansetron **OR** ondansetron (ZOFRAN) IV, senna-docusate  Assessment: 47 yo male transferred from [facility] for evaluation of NSTEMI. Patient states he was receiving heparin while at [facility], but not currently on now. Hgb 11.1, Plts low (hx alcohol use, MD aware), troponin 2277. No anticoagulant use PTA.  Goal of Therapy:  Heparin level 0.3-0.7 units/ml Monitor platelets by anticoagulation protocol: Yes   Plan:  Give 4000 units bolus x 1 Start heparin infusion at 1100 units/hr Check anti-Xa level in 6 hours and daily while on heparin Continue to monitor H&H and platelets  Loralee Pacas, PharmD, BCPS 03/20/2023,8:17 PM  Please check AMION for all [facility] Pharmacy phone numbers After 10:00 PM, call Main Pharmacy [phone]

## 2023-03-20 NOTE — Hospital Course (Signed)
Louis Byrd is a 47 y.o. male with medical history significant for CAD s/p CABG 10/2017, T1DM, HTN, HLD, hypothyroidism, alcohol use disorder who is admitted with NSTEMI.

## 2023-03-20 NOTE — Progress Notes (Signed)
Date and time results received: 03/20/23 2200   Test: APTT Critical Value: 175  Name of Provider Notified: Dr. Loney Loh  Orders Received? Yes, pharmacy notified, will continue to monitor patient.

## 2023-03-20 NOTE — Consult Note (Signed)
Cardiology Consultation   Patient ID: Louis Byrd MRN: [id number]; DOB: April 05, 1976  Admit date: 03/20/2023 Date of Consult: 03/20/2023  PCP:  Erskine Emery, NP   [facility] Providers Cardiologist:  None   Gypsy Balsam, MD - last seen in 10/2019 Click here to update MD or APP on Care Team, Refresh:1}     Patient Profile:   Louis Byrd is a 47 y.o. male with a hx of obstructive CAD s/p CABG (2019: LIMA-LAD, L radial-OM2, SVG-D1, sequential SVG-PDA and PL), T1DM, HTN, HLD who is being seen 03/20/2023 for the evaluation of concern for NSTEMI at the request of Dr. Allena Katz.  History of Present Illness:   Louis Byrd underwent CABG in 2019 after undergoing stress testing for 6 months of fatigue and new chest discomfort which revealed reversible ischemia and reduced LVEF.  He has not been seen in clinic since 2021.  States that he recently lost insurance.  Per chart review, admitted to [facility] on 03/18/2023 where he presented with nausea and vomiting for a few days with associated altered mental status.  He was at home for a week straight and started to experience abdominal discomfort which resulted in increasing his alcohol consumption.  He also stopped taking his insulin despite his point-of-care glucose rising as he was afraid of hypoglycemia while being alone.  He called EMS due to severe weakness and inability to walk from 1 part of the room to the next.  Noted to be in DKA on arrival with VBG pH <7.0, K7.9, bicarb 5, anion gap 33, BUN 24, Cr 2.1, lactate 3.1, glucose 800s, WBC 30K, troponin 0.01-0.03-0.45.  Was treated with IV insulin and IV fluids and broad-spectrum antibiotics due to concern for sepsis.  Troponins noted to rise to 11.0.  Per cardiology, no ischemic EKG changes at that time.  Treated with aspirin, Nitropaste and heparin drip.  On 8/6 prior to transfer, noted to be chest pain-free and troponin was downtrending.  Abdominal discomfort had resolved with treatment  of DKA.  Prior to transfer, had been receiving metoprolol tartrate 25 mg twice daily, lisinopril 10 mg daily, IV heparin, Crestor 10 mg daily.  TTE performed 03/18/2023 at [facility], summary of written report: LVEF 55 to 60% with normal size diastolic function.  Basal inferior wall noted to be hypokinetic.  Normal RV size and function, no significant valvular disease.  He tells me that he has been experiencing both rest and exertional chest discomfort for around 6 months.  He describes it as a seatbelt feeling around his chest.  Lasts anywhere from 2-10 minutes and resolves spontaneously without intervention.  Over the last 2 months, he has had new non-exertional pain in his left scapula region that does not always coincide with his chest discomfort.  Experiences associated dyspnea.  No associated palpitations, lower extremity edema, nausea/vomiting, dizziness/lightheadedness, orthopnea or PND.  He has been experiencing the same chest discomfort symptoms while being admitted at [facility].  He tells me that he has been taking an aspirin 81 mg daily, lisinopril and Coreg on a regular basis prior to this admission.  States 4 DKA admissions in the last 1.5 years.  Past Medical History:  Diagnosis Date   Arthritis    Atypical chest pain 09/25/2017   Bursitis of right hip    Chest pain    a. 1/2/019 Ex [facility]): Ex time 8:00 No ecg changes. Large severe apical rev defect and small mod sev apicoseptal rev defect. EF  42%.   Coronary artery disease    Diabetes (HCC) 09/25/2017   Essential hypertension 01/29/2018   Hyperlipidemia    Hypothyroidism    Palpitations    a. premature atrial contractions.   Pulmonary nodule    a. 08/2017 CXR - [facility]: "pulm nodule posterior lung base on lateral film, rec noncontrast chest CT for further eval.   S/P CABG x 5 10/12/2017   Seizures (HCC)    "only related to low blood sugars" (09/25/2017)   Solitary pulmonary nodule 09/25/2017   Type I  diabetes mellitus (HCC)    a. Dx age 42;  b. Most recent A1c 6.3.   Unstable angina [facility])     Past Surgical History:  Procedure Laterality Date   CORONARY ARTERY BYPASS GRAFT N/A 10/01/2017   Procedure: CORONARY ARTERY BYPASS GRAFTING (CABG) x5 using the left internal mammary artery, the right greater saphenous vein harvested endoscopically, and the left radial artery.;  Surgeon: Loreli Slot, MD;  Location: [facility] OR;  Service: Open Heart Surgery;  Laterality: N/A;   I and D of groin  2017   for MRSA infection   KNEE ARTHROSCOPY Right 1990s   LEFT HEART CATH AND CORONARY ANGIOGRAPHY N/A 09/26/2017   Procedure: LEFT HEART CATH AND CORONARY ANGIOGRAPHY;  Surgeon: Tonny Bollman, MD;  Location: [facility] INVASIVE CV LAB;  Service: Cardiovascular;  Laterality: N/A;   RADIAL ARTERY HARVEST Left 10/01/2017   Procedure: RADIAL ARTERY HARVEST;  Surgeon: Loreli Slot, MD;  Location: [facility] OR;  Service: Open Heart Surgery;  Laterality: Left;   TEE WITHOUT CARDIOVERSION N/A 10/01/2017   Procedure: TRANSESOPHAGEAL ECHOCARDIOGRAM (TEE);  Surgeon: Loreli Slot, MD;  Location: [facility] OR;  Service: Open Heart Surgery;  Laterality: N/A;     Home Medications:  Prior to Admission medications   Medication Sig Start Date End Date Taking? Authorizing Provider  aspirin EC 81 MG tablet Take 1 tablet (81 mg total) by mouth daily. 01/04/18  Yes Georgeanna Lea, MD  carvedilol (COREG) 6.25 MG tablet TAKE 1 Tablet  BY MOUTH TWICE DAILY (PATIENT NEEDS OFFICE VISIT) 04/22/21  Yes Georgeanna Lea, MD  fluticasone [facility]) 50 MCG/ACT nasal spray Place 1 spray into both nostrils at bedtime. 03/26/20  Yes [provider]  HUMALOG 100 UNIT/ML injection Inject 3-12 Units into the skin daily as needed. 12/19/19  Yes [provider]  Insulin Glargine (BASAGLAR KWIKPEN) 100 UNIT/ML Inject 20 Units into the skin daily.  12/19/19  Yes [provider]  lisinopril (ZESTRIL) 10 MG tablet Take 10  mg by mouth daily.   Yes [provider]  METOPROLOL SUCCINATE PO Take 1 tablet by mouth daily.   Yes [provider]  omeprazole (PRILOSEC) 20 MG capsule Take 40 mg by mouth in the morning and at bedtime.   Yes [provider]  SYNTHROID 137 MCG tablet Take 137 mcg by mouth daily. 03/12/23  Yes [provider]    Inpatient Medications: Scheduled Meds:  [START ON 03/21/2023] aspirin EC  81 mg Oral Daily   folic acid  1 mg Oral Daily   insulin aspart  0-15 Units Subcutaneous Q4H   [START ON 03/21/2023] insulin glargine-yfgn  12 Units Subcutaneous Daily   [START ON 03/21/2023] levothyroxine  137 mcg Oral Q0600   [START ON 03/21/2023] lisinopril  10 mg Oral Daily   multivitamin with minerals  1 tablet Oral Daily   nitroGLYCERIN  1 inch Topical Q6H   sodium chloride flush  3 mL  Intravenous Q12H   thiamine  100 mg Oral Daily   Or   thiamine  100 mg Intravenous Daily   Continuous Infusions:  heparin 1,100 Units/hr (03/20/23 2058)   PRN Meds: acetaminophen **OR** acetaminophen, HYDROmorphone (DILAUDID) injection, LORazepam **OR** LORazepam, ondansetron **OR** ondansetron (ZOFRAN) IV, senna-docusate  Allergies:    Allergies  Allergen Reactions   Morphine Nausea And Vomiting    Social History:   Social History   Socioeconomic History   Marital status: Married    Spouse name: Not on file   Number of children: Not on file   Years of education: Not on file   Highest education level: Not on file  Occupational History   Occupation: Armed forces training and education officer    Comment: does some heavy exertion.  Tobacco Use   Smoking status: Never   Smokeless tobacco: Current    Types: Chew, Snuff  Vaping Use   Vaping status: Never Used  Substance and Sexual Activity   Alcohol use: Yes    Alcohol/week: 28.0 standard drinks of alcohol    Types: 28 Cans of beer per week    Comment: 09/25/2017 "2, 25oz beers/night" - has stopped as of  09/24/17   Drug use: No   Sexual  activity: Not on file  Other Topics Concern   Not on file  Social History Narrative   Lives in [place] with his wife and their children.   Social Determinants of Health   Financial Resource Strain: Not on file  Food Insecurity: Not on file  Transportation Needs: Not on file  Physical Activity: Not on file  Stress: Not on file  Social Connections: Not on file  Intimate Partner Violence: Not on file    Family History:    Family History  Problem Relation Age of Onset   Heart attack Father    Diabetes Mellitus I Brother      ROS:  Please see the history of present illness.   All other ROS reviewed and negative.     Physical Exam/Data:   Vitals:   03/20/23 1700 03/20/23 2000  BP: (!) 141/92   Pulse: 60   Resp: 18   Temp: 98.1 F (36.7 C)   TempSrc: Oral   SpO2: 100%   Weight:  79.4 kg  Height:  5\' 9"  (1.753 m)   No intake or output data in the 24 hours ending 03/20/23 2158    03/20/2023    8:00 PM 03/30/2020    9:30 AM 10/23/2019    2:36 PM  Last 3 Weights  Weight (lbs) 175 lb 189 lb 9.6 oz 198 lb 12.8 oz  Weight (kg) 79.379 kg 86.002 kg 90.175 kg     Body mass index is 25.84 kg/m.  General:  Well nourished, well developed, in no acute distress HEENT: normal Neck: no JVD Vascular: 2+ R radial pulse, absent L radial pulse Cardiac:  normal S1, S2; bradycardic and regular; no murmur  Lungs:  clear to auscultation bilaterally, no wheezing, rhonchi or rales  Abd: soft, nontender, no hepatomegaly  Ext: no edema Musculoskeletal:  No deformities, BUE and BLE strength normal and equal Skin: warm and dry  Neuro:  CNs 2-12 intact, no focal abnormalities noted Psych:  Normal affect   EKG:  The EKG was personally reviewed and demonstrates:  sinus bradycardia, no ST or T wave changes, no pathologic Q waves, normal PR and QRS intervals Telemetry:  Telemetry was personally reviewed and demonstrates:  sinus bradycardia  Relevant CV Studies:  TTE performed 03/18/2023  at  [facility], summary of written report: LVEF 55 to 60% with normal size diastolic function.  Basal inferior wall noted to be hypokinetic.  Normal RV size and function, no significant valvular disease.  Laboratory Data:  High Sensitivity Troponin:   Recent Labs  Lab 03/20/23 1858  TROPONINIHS 2,277*     Chemistry Recent Labs  Lab 03/20/23 1858  NA 136  K 3.0*  CL 102  CO2 24  GLUCOSE 128*  BUN <5*  CREATININE 0.66  CALCIUM 8.2*  GFRNONAA >60  ANIONGAP 10    Recent Labs  Lab 03/20/23 1858  PROT 5.5*  ALBUMIN 2.7*  AST 32  ALT 20  ALKPHOS 60  BILITOT 0.8   Lipids No results for input(s): "CHOL", "TRIG", "HDL", "LABVLDL", "LDLCALC", "CHOLHDL" in the last 168 hours.  Hematology Recent Labs  Lab 03/20/23 1858  WBC 6.1  RBC 3.62*  HGB 11.1*  HCT 33.0*  MCV 91.2  MCH 30.7  MCHC 33.6  RDW 12.3  PLT 108*   Thyroid No results for input(s): "TSH", "FREET4" in the last 168 hours.  BNPNo results for input(s): "BNP", "PROBNP" in the last 168 hours.  DDimer No results for input(s): "DDIMER" in the last 168 hours.   Radiology/Studies:  No results found.   Assessment and Plan:   NSTEMI, Type 1 vs Type 2 Obstructive CAD s/p CABG (2019: LIMA-LAD, L radial-OM2, SVG-D1, sequential SVG-PDA and PL) Hypertension Hyperlipidemia Initially presented on 8/4 to [facility] in DKA.  Troponins significantly elevated on presentation with uptrending delta and peaked.  No ischemic EKG changes.  Underwent CABG in 2019 with infrequent follow-up since.  Has been experiencing exertional and non-exertional chest discomfort and fatigue in the outpatient setting for around 6 months.  His chronic symptoms in combination with significantly elevated troponins on this presentation and chest pain during this admission warrant further ischemic evaluation.  Given his significant risk factors, will opt for diagnostic angiogram.  His DKA has resolved.  TTE on 03/18/23 with normal LV function  with noted basal inferior hypokinesis.  Type II NSTEMI also possible given significant DKA on presentation. - Keep NPO at midnight for angiogram - 1 additional troponin - K>4 and mag>2 - Continue aspirin 81mg  - Continue IV heparin, ACS protocol - Reduce metoprolol to 125.mg BID given sinuis bradycardia in the 50s, next dose 8/7 AM - Continue Lisinopril, will likely need to uptitrate following cath - Agree with nitroglycerin paste for CP treatment - Update lipid panel, goal LDL <55  Risk Assessment/Risk Scores:     TIMI Risk Score for Unstable Angina or Non-ST Elevation MI:   The patient's TIMI risk score is 5, which indicates a 26% risk of all cause mortality, new or recurrent myocardial infarction or need for urgent revascularization in the next 14 days.          For questions or updates, please contact [facility] Please consult [email or web address] for contact info under    Signed, Aundra Dubin, MD  03/20/2023 9:58 PM

## 2023-03-20 NOTE — Progress Notes (Signed)
Troponin 2277.  Value read back.  Will notify MD.

## 2023-03-20 NOTE — Plan of Care (Signed)
  Problem: Education: Goal: Knowledge of General Education information will improve Description: Including pain rating scale, medication(s)/side effects and non-pharmacologic comfort measures Outcome: Progressing   Problem: Health Behavior/Discharge Planning: Goal: Ability to manage health-related needs will improve Outcome: Progressing   Problem: Clinical Measurements: Goal: Ability to maintain clinical measurements within normal limits will improve Outcome: Progressing  Patient is cooperating with care, patient has been updated about plan of care, and has no concern now, will continue to monitor.

## 2023-03-21 ENCOUNTER — Other Ambulatory Visit: Payer: Self-pay

## 2023-03-21 ENCOUNTER — Encounter (HOSPITAL_COMMUNITY)
Disposition: A | Discharge status: Home / Self Care | Payer: Self-pay | Source: Ambulatory Visit | Attending: Family Medicine

## 2023-03-21 ENCOUNTER — Encounter (HOSPITAL_COMMUNITY): Payer: Self-pay | Admitting: Internal Medicine

## 2023-03-21 ENCOUNTER — Encounter: Payer: Self-pay | Admitting: Family Medicine

## 2023-03-21 ENCOUNTER — Other Ambulatory Visit (HOSPITAL_COMMUNITY): Payer: Self-pay

## 2023-03-21 DIAGNOSIS — I2510 Atherosclerotic heart disease of native coronary artery without angina pectoris: Secondary | ICD-10-CM

## 2023-03-21 DIAGNOSIS — I214 Non-ST elevation (NSTEMI) myocardial infarction: Secondary | ICD-10-CM | POA: Diagnosis not present

## 2023-03-21 HISTORY — PX: CORONARY STENT INTERVENTION: CATH118234

## 2023-03-21 HISTORY — PX: LEFT HEART CATH AND CORS/GRAFTS ANGIOGRAPHY: CATH118250

## 2023-03-21 LAB — HEPARIN LEVEL (UNFRACTIONATED): Heparin Unfractionated: 0.24 IU/mL — ABNORMAL LOW (ref 0.30–0.70)

## 2023-03-21 LAB — GLUCOSE, CAPILLARY
Glucose-Capillary: 142 mg/dL — ABNORMAL HIGH (ref 70–99)
Glucose-Capillary: 143 mg/dL — ABNORMAL HIGH (ref 70–99)
Glucose-Capillary: 178 mg/dL — ABNORMAL HIGH (ref 70–99)
Glucose-Capillary: 185 mg/dL — ABNORMAL HIGH (ref 70–99)
Glucose-Capillary: 188 mg/dL — ABNORMAL HIGH (ref 70–99)
Glucose-Capillary: 195 mg/dL — ABNORMAL HIGH (ref 70–99)
Glucose-Capillary: 224 mg/dL — ABNORMAL HIGH (ref 70–99)
Glucose-Capillary: 246 mg/dL — ABNORMAL HIGH (ref 70–99)

## 2023-03-21 LAB — POCT ACTIVATED CLOTTING TIME
Activated Clotting Time: 305 seconds
Activated Clotting Time: 348 seconds
Activated Clotting Time: 244 seconds
Activated Clotting Time: 177 seconds

## 2023-03-21 SURGERY — LEFT HEART CATH AND CORS/GRAFTS ANGIOGRAPHY
Anesthesia: LOCAL

## 2023-03-21 MED ORDER — HEPARIN SODIUM (PORCINE) 1000 UNIT/ML IJ SOLN
INTRAMUSCULAR | Status: AC
  Filled 2023-03-21: qty 10

## 2023-03-21 MED ORDER — ACETAMINOPHEN 325 MG PO TABS: 650.0000 mg | ORAL_TABLET | ORAL | Status: DC | PRN

## 2023-03-21 MED ORDER — SODIUM CHLORIDE 0.9% FLUSH
3.0000 mL | Freq: Two times a day (BID) | INTRAVENOUS | Status: DC
  Administered 2023-03-21 – 2023-03-22 (×2): 3 mL via INTRAVENOUS

## 2023-03-21 MED ORDER — TICAGRELOR 90 MG PO TABS
90.0000 mg | ORAL_TABLET | Freq: Two times a day (BID) | ORAL | Status: DC
  Administered 2023-03-21 – 2023-03-22 (×2): 90 mg via ORAL
  Filled 2023-03-21 (×2): qty 1

## 2023-03-21 MED ORDER — MIDAZOLAM HCL 2 MG/2ML IJ SOLN
INTRAMUSCULAR | Status: AC
  Filled 2023-03-21: qty 2

## 2023-03-21 MED ORDER — HYDRALAZINE HCL 20 MG/ML IJ SOLN
10.0000 mg | INTRAMUSCULAR | Status: AC | PRN
  Administered 2023-03-21: 10 mg via INTRAVENOUS

## 2023-03-21 MED ORDER — ROSUVASTATIN CALCIUM 5 MG PO TABS
10.0000 mg | ORAL_TABLET | Freq: Every day | ORAL | Status: DC
  Administered 2023-03-22: 10 mg via ORAL
  Filled 2023-03-21: qty 2

## 2023-03-21 MED ORDER — LIDOCAINE HCL (PF) 1 % IJ SOLN
INTRAMUSCULAR | Status: DC | PRN
  Administered 2023-03-21: 8 mL

## 2023-03-21 MED ORDER — ASPIRIN 81 MG PO CHEW
81.0000 mg | CHEWABLE_TABLET | ORAL | Status: AC
  Administered 2023-03-21: 81 mg via ORAL
  Filled 2023-03-21: qty 1

## 2023-03-21 MED ORDER — SODIUM CHLORIDE 0.9% FLUSH: 3.0000 mL | INTRAVENOUS | Status: DC | PRN

## 2023-03-21 MED ORDER — SODIUM CHLORIDE 0.9 % IV SOLN: 250.0000 mL | INTRAVENOUS | Status: DC | PRN

## 2023-03-21 MED ORDER — ONDANSETRON HCL 4 MG/2ML IJ SOLN
4.0000 mg | Freq: Four times a day (QID) | INTRAMUSCULAR | Status: DC | PRN
  Administered 2023-03-21 – 2023-03-22 (×2): 4 mg via INTRAVENOUS
  Filled 2023-03-21 (×2): qty 2

## 2023-03-21 MED ORDER — SODIUM CHLORIDE 0.9 % WEIGHT BASED INFUSION
3.0000 mL/kg/h | INTRAVENOUS | Status: DC
  Administered 2023-03-21: 3 mL/kg/h via INTRAVENOUS

## 2023-03-21 MED ORDER — NITROGLYCERIN 1 MG/10 ML FOR IR/CATH LAB
INTRA_ARTERIAL | Status: AC
  Filled 2023-03-21: qty 10

## 2023-03-21 MED ORDER — FENTANYL CITRATE (PF) 100 MCG/2ML IJ SOLN
INTRAMUSCULAR | Status: DC | PRN
  Administered 2023-03-21: 25 ug via INTRAVENOUS
  Administered 2023-03-21: 50 ug via INTRAVENOUS

## 2023-03-21 MED ORDER — LIDOCAINE HCL (PF) 1 % IJ SOLN
INTRAMUSCULAR | Status: AC
  Filled 2023-03-21: qty 30

## 2023-03-21 MED ORDER — SODIUM CHLORIDE 0.9 % WEIGHT BASED INFUSION: 1.0000 mL/kg/h | INTRAVENOUS | Status: DC

## 2023-03-21 MED ORDER — MIDAZOLAM HCL 2 MG/2ML IJ SOLN
INTRAMUSCULAR | Status: DC | PRN
  Administered 2023-03-21: 1 mg via INTRAVENOUS
  Administered 2023-03-21: 2 mg via INTRAVENOUS

## 2023-03-21 MED ORDER — ONDANSETRON HCL 4 MG/2ML IJ SOLN
INTRAMUSCULAR | Status: AC
  Filled 2023-03-21: qty 2

## 2023-03-21 MED ORDER — FENTANYL CITRATE (PF) 100 MCG/2ML IJ SOLN
INTRAMUSCULAR | Status: AC
  Filled 2023-03-21: qty 2

## 2023-03-21 MED ORDER — OXYCODONE-ACETAMINOPHEN 7.5-325 MG PO TABS
1.0000 | ORAL_TABLET | Freq: Four times a day (QID) | ORAL | Status: DC | PRN
  Administered 2023-03-21 – 2023-03-22 (×2): 1 via ORAL
  Filled 2023-03-21 (×2): qty 1

## 2023-03-21 MED ORDER — HEPARIN SODIUM (PORCINE) 1000 UNIT/ML IJ SOLN
INTRAMUSCULAR | Status: DC | PRN
  Administered 2023-03-21: 10000 [IU] via INTRAVENOUS

## 2023-03-21 MED ORDER — SODIUM CHLORIDE 0.9 % IV SOLN: INTRAVENOUS | Status: AC

## 2023-03-21 MED ORDER — HEPARIN (PORCINE) IN NACL 1000-0.9 UT/500ML-% IV SOLN
INTRAVENOUS | Status: DC | PRN
  Administered 2023-03-21 (×2): 500 mL

## 2023-03-21 MED ORDER — IOHEXOL 350 MG/ML SOLN
INTRAVENOUS | Status: DC | PRN
  Administered 2023-03-21: 120 mL

## 2023-03-21 MED ORDER — HYDROMORPHONE HCL 1 MG/ML IJ SOLN
INTRAMUSCULAR | Status: AC
  Filled 2023-03-21: qty 0.5

## 2023-03-21 MED ORDER — LABETALOL HCL 5 MG/ML IV SOLN: 10.0000 mg | INTRAVENOUS | Status: AC | PRN

## 2023-03-21 MED ORDER — HYDRALAZINE HCL 20 MG/ML IJ SOLN
INTRAMUSCULAR | Status: AC
  Filled 2023-03-21: qty 1

## 2023-03-21 MED ORDER — TICAGRELOR 90 MG PO TABS
ORAL_TABLET | ORAL | Status: DC | PRN
  Administered 2023-03-21: 180 mg via ORAL

## 2023-03-21 SURGICAL SUPPLY — 20 items
BALLN SAPPHIRE 2.0X12 (BALLOONS) ×1
BALLN ~~LOC~~ EMERGE MR 3.25X20 (BALLOONS) ×1
BALLOON SAPPHIRE 2.0X12 (BALLOONS) IMPLANT
BALLOON ~~LOC~~ EMERGE MR 3.25X20 (BALLOONS) IMPLANT
CATH INFINITI 5 FR LCB (CATHETERS) IMPLANT
CATH INFINITI 5FR MULTPACK ANG (CATHETERS) IMPLANT
CATH LAUNCHER 6FR JR4 (CATHETERS) IMPLANT
COVER DOME SNAP 22 D (MISCELLANEOUS) IMPLANT
ELECT DEFIB PAD ADLT CADENCE (PAD) IMPLANT
KIT ENCORE 26 ADVANTAGE (KITS) IMPLANT
KIT MICROPUNCTURE NIT STIFF (SHEATH) IMPLANT
PACK CARDIAC CATHETERIZATION (CUSTOM PROCEDURE TRAY) ×1 IMPLANT
SET ATX-X65L (MISCELLANEOUS) IMPLANT
SHEATH PINNACLE 5F 10CM (SHEATH) IMPLANT
SHEATH PINNACLE 6F 10CM (SHEATH) IMPLANT
SHEATH PROBE COVER 6X72 (BAG) IMPLANT
STENT ONYX FRONTIER 2.25X12 (Permanent Stent) IMPLANT
STENT ONYX FRONTIER 3.0X38 (Permanent Stent) IMPLANT
WIRE COUGAR XT STRL 190CM (WIRE) IMPLANT
WIRE EMERALD 3MM-J .035X150CM (WIRE) IMPLANT

## 2023-03-21 NOTE — Progress Notes (Signed)
ANTICOAGULATION CONSULT NOTE - Follow Up  Pharmacy Consult for Heparin Indication: chest pain/ACS  Allergies  Allergen Reactions   Morphine Nausea And Vomiting    Patient Measurements: Height: 5\' 9"  (175.3 cm) Weight: 79.4 kg (175 lb) IBW/kg (Calculated) : 70.7 Heparin Dosing Weight: total weight  Vital Signs: Temp: 98.1 F (36.7 C) (08/06 1700) Temp Source: Oral (08/06 1700) BP: 141/92 (08/06 1700) Pulse Rate: 60 (08/06 1700)  Labs: Recent Labs    03/20/23 1858 03/20/23 2151 03/21/23 0254  HGB 11.1*  --  11.3*  HCT 33.0*  --  33.0*  PLT 108*  --  99*  APTT  --  175*  --   LABPROT  --  15.6*  --   INR  --  1.2  --   HEPARINUNFRC  --   --  0.30  CREATININE 0.66  --   --   TROPONINIHS 2,277* 2,262*  --     Estimated Creatinine Clearance: 115.4 mL/min (by C-G formula based on SCr of 0.66 mg/dL).   Medical History: Past Medical History:  Diagnosis Date   Arthritis    Atypical chest pain 09/25/2017   Bursitis of right hip    Chest pain    a. 1/2/019 Ex MV [facility]): Ex time 8:00 No ecg changes. Large severe apical rev defect and small mod sev apicoseptal rev defect. EF 42%.   Coronary artery disease    Diabetes (HCC) 09/25/2017   Essential hypertension 01/29/2018   Hyperlipidemia    Hypothyroidism    Palpitations    a. premature atrial contractions.   Pulmonary nodule    a. 08/2017 CXR - [facility]: "pulm nodule posterior lung base on lateral film, rec noncontrast chest CT for further eval.   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 Dx age 89;  b. Most recent A1c 6.3.   Unstable angina (HCC)     Medications:  Scheduled:   aspirin EC  81 mg Oral Daily   folic acid  1 mg Oral Daily   insulin aspart  0-15 Units Subcutaneous Q4H   insulin glargine-yfgn  12 Units Subcutaneous Daily   levothyroxine  137 mcg Oral Q0600    lisinopril  10 mg Oral Daily   multivitamin with minerals  1 tablet Oral Daily   nitroGLYCERIN  1 inch Topical Q6H   sodium chloride flush  3 mL Intravenous Q12H   thiamine  100 mg Oral Daily   Or   thiamine  100 mg Intravenous Daily   Infusions:   heparin 1,100 Units/hr (03/20/23 2058)   PRN: acetaminophen **OR** acetaminophen, HYDROmorphone (DILAUDID) injection, LORazepam **OR** LORazepam, ondansetron **OR** ondansetron (ZOFRAN) IV, senna-docusate  Assessment: 47 yo male transferred from [facility] for evaluation of NSTEMI. Patient states he was receiving heparin while at [facility], but not currently on now. Hgb 11.1, Plts low (hx alcohol use, MD aware), troponin 2277. No anticoagulant use PTA.  8/7 AM: heparin level returned at 0.3 on 1100 units/hr (therapeutic). No signs/symptoms of bleeding reported or issues with the heparin infusion. Hgb low, stable, plts 108>99  Goal of Therapy:  Heparin level 0.3-0.7 units/ml Monitor platelets by anticoagulation protocol: Yes   Plan:  Increase heparin infusion to 1150 units/hr Check confirmatory anti-Xa level in 6 hours and daily while on heparin Continue to monitor H&H and platelets  Thanks,  Arabella Merles, PharmD. Clinical Pharmacist 03/21/2023 3:34 AM

## 2023-03-21 NOTE — Progress Notes (Signed)
ANTICOAGULATION CONSULT NOTE - Follow Up  Pharmacy Consult for Heparin Indication: chest pain/ACS  Allergies  Allergen Reactions   Morphine Nausea And Vomiting    Patient Measurements: Height: 5\' 9"  (175.3 cm) Weight: 80.9 kg (178 lb 5.6 oz) IBW/kg (Calculated) : 70.7 Heparin Dosing Weight: total weight  Vital Signs: Temp: 98.1 F (36.7 C) (08/07 0803) Temp Source: Oral (08/07 0803) BP: 139/96 (08/07 0803) Pulse Rate: 53 (08/07 0803)  Labs: Recent Labs    03/20/23 1858 03/20/23 2151 03/21/23 0254 03/21/23 1010  HGB 11.1*  --  11.3*  --   HCT 33.0*  --  33.0*  --   PLT 108*  --  99*  --   APTT  --  175*  --   --   LABPROT  --  15.6*  --   --   INR  --  1.2  --   --   HEPARINUNFRC  --   --  0.30 0.24*  CREATININE 0.66  --  0.72  --   TROPONINIHS 2,277* 2,262*  --   --     Estimated Creatinine Clearance: 115.4 mL/min (by C-G formula based on SCr of 0.72 mg/dL).   Medical History: Past Medical History:  Diagnosis Date   Arthritis    Atypical chest pain 09/25/2017   Bursitis of right hip    Chest pain    a. 1/2/019 Ex MV [facility]via): Ex time 8:00 No ecg changes. Large severe apical rev defect and small mod sev apicoseptal rev defect. EF 42%.   Coronary artery disease    Diabetes (HCC) 09/25/2017   Essential hypertension 01/29/2018   Hyperlipidemia    Hypothyroidism    Palpitations    a. premature atrial contractions.   Pulmonary nodule    a. 08/2017 CXR - [facility]: "pulm nodule posterior lung base on lateral film, rec noncontrast chest CT for further eval.   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 Dx age 77;  b. Most recent A1c 6.3.   Unstable angina (HCC)     Medications:  Scheduled:   aspirin EC  81 mg Oral Daily   folic acid  1 mg Oral Daily   insulin aspart  0-15 Units Subcutaneous Q4H   insulin glargine-yfgn  12 Units  Subcutaneous Daily   levothyroxine  137 mcg Oral Q0600   lisinopril  10 mg Oral Daily   multivitamin with minerals  1 tablet Oral Daily   nitroGLYCERIN  1 inch Topical Q6H   rosuvastatin  10 mg Oral Daily   sodium chloride flush  3 mL Intravenous Q12H   thiamine  100 mg Oral Daily   Or   thiamine  100 mg Intravenous Daily   Infusions:   sodium chloride     heparin 1,150 Units/hr (03/21/23 0346)   PRN: acetaminophen **OR** acetaminophen, HYDROmorphone (DILAUDID) injection, LORazepam **OR** LORazepam, ondansetron **OR** ondansetron (ZOFRAN) IV, senna-docusate  Assessment: 47 yo male transferred from [facility] for evaluation of NSTEMI. Plts low (hx alcohol use, MD aware), . No anticoagulant use PTA.  -heparin level = 0.24 on 1150 units/hr. Plan noted for cath today  Goal of Therapy:  Heparin level 0.3-0.7 units/ml Monitor platelets by anticoagulation protocol: Yes   Plan:  Increase heparin infusion to 1250 units/hr Will follow plans post cath  Harland German, PharmD Clinical Pharmacist **Pharmacist  phone directory can now be found on [email or web address] ([email or web address]).  Listed under [facility].

## 2023-03-21 NOTE — Progress Notes (Signed)
PROGRESS NOTE    Louis Byrd  WUJ:[id number] DOB: [birth year] DOA: 03/20/2023 PCP: Erskine Emery, NP   Brief Narrative:  Louis Byrd is a 47 y.o. 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   Patient transferred from [facility] where he was admitted early morning 8/4.  He was initially admitted for management of DKA which has been treated and resolved. During admission he was noted to have elevated troponin with peak troponin level 11.4 before trending down. Echocardiogram 8/4 showed EF 55-60%, no regional wall motion abnormality. He was seen by cardiology Dr. Bing Matter, who recommended transfer to [facility] for consideration of cardiac cath.    Patient does state that he is an alcoholic and drinks about 5-6 25 oz beers daily.  Last drink was approximately on 03/16/2023.  He reports occasional marijuana use as well as using tobacco dip but denies smoking cigarettes.  He denies any cocaine or injection drug use.  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  TTE at OSH 8/4 with EF 55-60%, no regional wall motion abnormality noted. -Continue trending troponin 2277 >> 2262.  Seen by cardiology, started on IV heparin, remains on aspirin and nitro ointment.  Plan for cardiac cath today.  Beta-blocker on hold per cardiology.   Type 1 diabetes: Hemoglobin A1c 7.9.  Treated for DKA at [facility] prior to transfer which has since resolved.  Uses 20 units of Lantus at  home, currently on 12 units and SSI, hyperglycemic but he is n.p.o. so we will continue 12 units today and reassess tomorrow.     Alcohol use disorder: Patient reports drinking 5-6 25 oz beers daily with last drink about 4 days prior to this admission.  CIWA protocol with as needed Ativan is ordered but I do not see that she was calculated.  Discussed with the nurse.   Hypertension: Blood pressure stable.  Continue lisinopril 10 mg daily.   Hypokalemia: Resolved.   Hyperlipidemia: Does not appear to be on daily statin.   Hypothyroidism: Continue Synthroid.   Acute thrombocytopenia: Mild without obvious bleeding.  Monitor while on IV heparin.  Stable currently.  DVT prophylaxis: Heparin   Code Status: Full Code  Family Communication: Wife present at bedside.  Plan of care discussed with patient in length and he/she verbalized understanding and agreed with it.  Status is: Inpatient Remains inpatient appropriate because: Scheduled for cardiac cath today.   Estimated body mass index is 26.34 kg/m as calculated from the following:   Height as of this encounter: 5\' 9"  (1.753 m).   Weight as of this encounter: 80.9 kg.    Nutritional Assessment: Body mass index is 26.34 kg/m.Marland Kitchen Seen by dietician.  I agree with the assessment and plan as outlined below: Nutrition Status:        . Skin Assessment: I have examined the patient's skin and I agree with the wound assessment as performed by the wound care RN as outlined below:    Consultants:  Cardiology  Procedures:  As above  Antimicrobials:  Anti-infectives (From admission, onward)    None         Subjective: Seen and examined.  Feels well.  No complaints.  Wife at the bedside.  Objective: Vitals:   03/20/23 1700 03/20/23 2000 03/21/23 0409 03/21/23 0803  BP: (!) 141/92  (!) 143/92 (!) 139/96  Pulse: 60  (!) 53 (!) 53  Resp: 18  18 18   Temp: 98.1 F (36.7 C)  98.3 F (36.8 C) 98.1 F (36.7 C)  TempSrc:  Oral  Oral Oral  SpO2: 100%   97%  Weight:  79.4 kg 80.9 kg   Height:  5\' 9"  (1.753 m)      Intake/Output Summary (Last 24 hours) at 03/21/2023 1008 Last data filed at 03/21/2023 0804 Gross per 24 hour  Intake 0 ml  Output 1100 ml  Net -1100 ml   Filed Weights   03/20/23 2000 03/21/23 0409  Weight: 79.4 kg 80.9 kg    Examination:  General exam: Appears calm and comfortable  Respiratory system: Clear to auscultation. Respiratory effort normal. Cardiovascular system: S1 & S2 heard, RRR. No JVD, murmurs, rubs, gallops or clicks. No pedal edema. Gastrointestinal system: Abdomen is nondistended, soft and nontender. No organomegaly or masses felt. Normal bowel sounds heard. Central nervous system: Alert and oriented. No focal neurological deficits. Extremities: Symmetric 5 x 5 power. Skin: No rashes, lesions or ulcers Psychiatry: Judgement and insight appear normal. Mood & affect appropriate.    Data Reviewed: I have personally reviewed following labs and imaging studies  CBC: Recent Labs  Lab 03/20/23 1858 03/21/23 0254  WBC 6.1 5.7  HGB 11.1* 11.3*  HCT 33.0* 33.0*  MCV 91.2 92.4  PLT 108* 99*   Basic Metabolic Panel: Recent Labs  Lab 03/20/23 1858 03/20/23 2151 03/21/23 0254  NA 136  --  133*  K 3.0*  --  3.8  CL 102  --  99  CO2 24  --  24  GLUCOSE 128*  --  231*  BUN <5*  --  <5*  CREATININE 0.66  --  0.72  CALCIUM 8.2*  --  8.2*  MG  --  2.0  --    GFR: Estimated Creatinine Clearance: 115.4 mL/min (by C-G formula based on SCr of 0.72 mg/dL). Liver Function Tests: Recent Labs  Lab 03/20/23 1858  AST 32  ALT 20  ALKPHOS 60  BILITOT 0.8  PROT 5.5*  ALBUMIN 2.7*   No results for input(s): "LIPASE", "AMYLASE" in the last 168 hours. No results for input(s): "AMMONIA" in the last 168 hours. Coagulation Profile: Recent Labs  Lab 03/20/23 2151  INR 1.2   Cardiac Enzymes: No results for input(s): "CKTOTAL", "CKMB", "CKMBINDEX", "TROPONINI" in the  last 168 hours. BNP (last 3 results) No results for input(s): "PROBNP" in the last 8760 hours. HbA1C: Recent Labs    03/21/23 0254  HGBA1C 7.9*   CBG: Recent Labs  Lab 03/20/23 2020 03/20/23 2352 03/21/23 0258 03/21/23 0349  GLUCAP 113* 182* 224* 246*   Lipid Profile: No results for input(s): "CHOL", "HDL", "LDLCALC", "TRIG", "CHOLHDL", "LDLDIRECT" in the last 72 hours. Thyroid Function Tests: No results for input(s): "TSH", "T4TOTAL", "FREET4", "T3FREE", "THYROIDAB" in the last 72 hours. Anemia Panel: No results for input(s): "VITAMINB12", "FOLATE", "FERRITIN", "TIBC", "IRON", "RETICCTPCT" in the last 72 hours. Sepsis Labs: No results for input(s): "PROCALCITON", "LATICACIDVEN" in the last 168 hours.  No results found for this or any previous visit (from the past 240 hour(s)).  Radiology Studies: No results found.  Scheduled Meds:  aspirin EC  81 mg Oral Daily   folic acid  1 mg Oral Daily   insulin aspart  0-15 Units Subcutaneous Q4H   insulin glargine-yfgn  12 Units Subcutaneous Daily   levothyroxine  137 mcg Oral Q0600   lisinopril  10 mg Oral Daily   multivitamin with minerals  1 tablet Oral Daily   nitroGLYCERIN  1 inch Topical Q6H   rosuvastatin  10 mg Oral Daily   sodium chloride flush  3 mL Intravenous Q12H   thiamine  100 mg Oral Daily   Or   thiamine  100 mg Intravenous Daily   Continuous Infusions:  sodium chloride     heparin 1,150 Units/hr (03/21/23 0346)     LOS: 1 day   Hughie Closs, MD [facility]  03/21/2023, 10:08 AM   *Please note that this is a verbal dictation therefore any spelling or grammatical errors are due to the "Dragon Medical One" system interpretation.  Please page via Amion and do not message via secure chat for urgent patient care matters. Secure chat can be used for non urgent patient care matters.  How to contact the [facility] Attending or Consulting provider 7A - 7P or covering provider during after hours 7P -7A, for  this patient?  Check the care team in [facility] and look for a) attending/consulting TRH provider listed and b) the [facility] team listed. Page or secure chat 7A-7P. Log into [email or web address] and use [place]'s universal password to access. If you do not have the password, please contact the hospital operator. Locate the [facility] provider you are looking for under [facility] and page to a number that you can be directly reached. If you still have difficulty reaching the provider, please page the [facility] (Director on Call) for the Hospitalists listed on amion for assistance.

## 2023-03-21 NOTE — Interval H&P Note (Signed)
History and Physical Interval Note:  03/21/2023 12:10 PM  Louis Byrd  has presented today for surgery, with the diagnosis of nstemi.  The various methods of treatment have been discussed with the patient and family. After consideration of risks, benefits and other options for treatment, the patient has consented to  Procedure(s): LEFT HEART CATH AND CORS/GRAFTS ANGIOGRAPHY (N/A) as a surgical intervention.  The patient's history has been reviewed, patient examined, no change in status, stable for surgery.  I have reviewed the patient's chart and labs.  Questions were answered to the patient's satisfaction.    Cath Lab Visit (complete for each Cath Lab visit)  Clinical Evaluation Leading to the Procedure:   ACS: Yes.    Non-ACS:    Anginal Classification: CCS III  Anti-ischemic medical therapy: Minimal Therapy (1 class of medications)  Non-Invasive Test Results: No non-invasive testing performed  Prior CABG: Previous CABG        Verne Carrow

## 2023-03-21 NOTE — TOC Initial Note (Signed)
Transition of Care [facility]) - Initial/Assessment Note    Patient Details  Name: Louis Byrd MRN: [id number] Date of Birth: [birth year]  Transition of Care [facility]) CM/SW Contact:    Gala Lewandowsky, RN Phone Number: 03/21/2023, 11:26 AM  Clinical Narrative: Patient presented as a transfer from [facility] for chest pain. Patient plan for [facility] today. Case Manager spoke with patient due to patient did not realize he had insurance. Monia Pouch is active; however, the patient has a $4500.00 deductible. Patient is aware of the deductible. Patient has PCP in [place] Dr. Jason Fila Amin-added in [facility]. Case Manager will continue to follow for transition of care needs as the patient progresses.                 Expected Discharge Plan: Home/Self Care Barriers to Discharge: Continued Medical Work up   Patient Goals and CMS Choice Patient states their goals for this hospitalization and ongoing recovery are:: plan is to return home.   Choice offered to / list presented to : NA    Expected Discharge Plan and Services In-house Referral: Clinical Social Work Discharge Planning Services: CM Consult Post Acute Care Choice: NA Living arrangements for the past 2 months: Single Family Home                   DME Agency: NA       HH Arranged: NA    Prior Living Arrangements/Services Living arrangements for the past 2 months: Single Family Home Lives with:: Spouse Patient language and need for interpreter reviewed:: Yes Do you feel safe going back to the place where you live?: Yes      Need for Family Participation in Patient Care: Yes (Comment) Care giver support system in place?: Yes (comment)   Criminal Activity/Legal Involvement Pertinent to Current Situation/Hospitalization: No - Comment as needed  Activities of Daily Living Home Assistive Devices/Equipment: CBG Meter ADL Screening (condition at time of admission) Patient's cognitive ability adequate to safely complete daily activities?:  Yes Is the patient deaf or have difficulty hearing?: Yes Does the patient have difficulty seeing, even when wearing glasses/contacts?: Yes Does the patient have difficulty concentrating, remembering, or making decisions?: No Patient able to express need for assistance with ADLs?: No Does the patient have difficulty dressing or bathing?: No Independently performs ADLs?: Yes (appropriate for developmental age) Does the patient have difficulty walking or climbing stairs?: Yes Weakness of Legs: Both Weakness of Arms/Hands: None  Permission Sought/Granted Permission sought to share information with : Family Supports, Case Manager   Emotional Assessment Appearance:: Appears stated age Attitude/Demeanor/Rapport: Engaged Affect (typically observed): Appropriate Orientation: : Oriented to Self, Oriented to Place, Oriented to  Time, Oriented to Situation Alcohol / Substance Use: Not Applicable Psych Involvement: No (comment)  Admission diagnosis:  NSTEMI (non-ST elevated myocardial infarction) [facility]) [I21.4] Patient Active Problem List   Diagnosis Date Noted   NSTEMI (non-ST elevated myocardial infarction) (HCC) 03/20/2023   Alcohol use disorder 03/20/2023   Hypokalemia 03/20/2023   Thrombocytopenia (HCC) 03/20/2023   Arthritis    Bursitis of right hip    Chest pain    Palpitations    Pulmonary nodule    Seizures (HCC)    Type 1 diabetes (HCC)    Hypertension associated with diabetes (HCC) 01/29/2018   S/P CABG x 5 10/12/2017   Coronary artery disease 10/01/2017   Atypical chest pain 09/25/2017   Solitary pulmonary nodule 09/25/2017   Diabetes (HCC) 09/25/2017   Hyperlipidemia associated with type 2  diabetes mellitus (HCC) 09/25/2017   Hypothyroidism 09/25/2017   Unstable angina [facility])    PCP:  Erskine Emery, NP Pharmacy:   [facility] [place], [place] - [address] [place] [address] Phone: [phone] Fax:  [phone]  [id number] - [place], [place] - [address] AT [facility] [address] [place] [address] Phone: [phone] Fax: [phone]  Social Determinants of Health (SDOH) Social History: SDOH Screenings   Food Insecurity: No Food Insecurity (03/21/2023)  Housing: Low Risk  (03/21/2023)  Transportation Needs: Unmet Transportation Needs (03/21/2023)  Utilities: At Risk (03/21/2023)  Alcohol Screen: High Risk (03/21/2023)  Depression (PHQ2-9): High Risk (03/21/2023)  Financial Resource Strain: High Risk (03/21/2023)  Physical Activity: Inactive (03/21/2023)  Social Connections: Moderately Isolated (03/21/2023)  Stress: Stress Concern Present (03/21/2023)  Tobacco Use: High Risk (03/21/2023)  Health Literacy: Inadequate Health Literacy (03/21/2023)   Readmission Risk Interventions     No data to display

## 2023-03-21 NOTE — H&P (View-Only) (Signed)
Rounding Note    Patient Name: Louis Byrd Date of Encounter: 03/21/2023  [facility] Cardiologist: None   Subjective   No chest pain but feels nauseated this morning with back pain (chronic)  Inpatient Medications    Scheduled Meds:  aspirin  81 mg Oral Pre-Cath   aspirin EC  81 mg Oral Daily   folic acid  1 mg Oral Daily   insulin aspart  0-15 Units Subcutaneous Q4H   insulin glargine-yfgn  12 Units Subcutaneous Daily   levothyroxine  137 mcg Oral Q0600   lisinopril  10 mg Oral Daily   multivitamin with minerals  1 tablet Oral Daily   nitroGLYCERIN  1 inch Topical Q6H   sodium chloride flush  3 mL Intravenous Q12H   thiamine  100 mg Oral Daily   Or   thiamine  100 mg Intravenous Daily   Continuous Infusions:  sodium chloride     Followed by   sodium chloride     heparin 1,150 Units/hr (03/21/23 0346)   PRN Meds: acetaminophen **OR** acetaminophen, HYDROmorphone (DILAUDID) injection, LORazepam **OR** LORazepam, ondansetron **OR** ondansetron (ZOFRAN) IV, senna-docusate   Vital Signs    Vitals:   03/20/23 1700 03/20/23 2000 03/21/23 0409  BP: (!) 141/92  (!) 143/92  Pulse: 60  (!) 53  Resp: 18  18  Temp: 98.1 F (36.7 C)  98.3 F (36.8 C)  TempSrc: Oral  Oral  SpO2: 100%    Weight:  79.4 kg 80.9 kg  Height:  5\' 9"  (1.753 m)     Intake/Output Summary (Last 24 hours) at 03/21/2023 0746 Last data filed at 03/21/2023 0346 Gross per 24 hour  Intake 0 ml  Output 700 ml  Net -700 ml      03/21/2023    4:09 AM 03/20/2023    8:00 PM 03/30/2020    9:30 AM  Last 3 Weights  Weight (lbs) 178 lb 5.6 oz 175 lb 189 lb 9.6 oz  Weight (kg) 80.9 kg 79.379 kg 86.002 kg      Telemetry    Sinus Bradycardia - Personally Reviewed  Physical Exam   GEN: No acute distress.   Neck: No JVD Cardiac: RRR, no murmurs, rubs, or gallops.  Respiratory: Clear to auscultation bilaterally. GI: Soft, nontender, non-distended  MS: No edema; No deformity. Neuro:   Nonfocal  Psych: Normal affect   Labs    High Sensitivity Troponin:   Recent Labs  Lab 03/20/23 1858 03/20/23 2151  TROPONINIHS 2,277* 2,262*     Chemistry Recent Labs  Lab 03/20/23 1858 03/20/23 2151 03/21/23 0254  NA 136  --  133*  K 3.0*  --  3.8  CL 102  --  99  CO2 24  --  24  GLUCOSE 128*  --  231*  BUN <5*  --  <5*  CREATININE 0.66  --  0.72  CALCIUM 8.2*  --  8.2*  MG  --  2.0  --   PROT 5.5*  --   --   ALBUMIN 2.7*  --   --   AST 32  --   --   ALT 20  --   --   ALKPHOS 60  --   --   BILITOT 0.8  --   --   GFRNONAA >60  --  >60  ANIONGAP 10  --  10    Lipids No results for input(s): "CHOL", "TRIG", "HDL", "LABVLDL", "LDLCALC", "CHOLHDL" in the last 168 hours.  Hematology  Recent Labs  Lab 03/20/23 1858 03/21/23 0254  WBC 6.1 5.7  RBC 3.62* 3.57*  HGB 11.1* 11.3*  HCT 33.0* 33.0*  MCV 91.2 92.4  MCH 30.7 31.7  MCHC 33.6 34.2  RDW 12.3 12.2  PLT 108* 99*   Thyroid No results for input(s): "TSH", "FREET4" in the last 168 hours.  BNPNo results for input(s): "BNP", "PROBNP" in the last 168 hours.  DDimer No results for input(s): "DDIMER" in the last 168 hours.   Radiology    No results found.  Cardiac Studies   N/a   Patient Profile     47 y.o. male with a hx of obstructive CAD s/p CABG (2019: LIMA-LAD, L radial-OM2, SVG-D1, sequential SVG-PDA and PL), T1DM, HTN, HLD who is being seen 03/20/2023 for the evaluation of concern for NSTEMI at the request of Dr. Allena Katz.   Assessment & Plan    NSTEMI CAD s/p CABG (LIMA-LAD, L radial-OM2, SVG-D1, sequential SVG-PDA and PL) '19 -- troponin I initially up to 0.45, but then peaked at 11. Patient reports he has been having intermittent episodes of chest discomfort for the past several months. Echo done at [facility]  showing LVEF of 55-60%, basal inferior wall hypokinesis, normal RV -- remains on IV heparin -- planned for cardiac cath today -- continue ASA, low statin (intolerance),  lisinopril  Informed Consent   Shared Decision Making/Informed Consent The risks [stroke (1 in 1000), death (1 in 1000), kidney failure [usually temporary] (1 in 500), bleeding (1 in 200), allergic reaction [possibly serious] (1 in 200)], benefits (diagnostic support and management of coronary artery disease) and alternatives of a cardiac catheterization were discussed in detail with Mr. Sollecito and he is willing to proceed.  Type I DM DKA -- [facility] to [facility] with elevated and CBGs and found to be in DKA. This is now resolved.  -- Hgb A1c 7.9 -- on SSI, long acting insulin per primary  HTN -- blood pressures stable -- continue lisinopril 10mg  daily, baseline bradycardia (will hold BB for now)  HLD -- reports he has not been on statin therapy PTA, had trouble with myalgias -- was resumed on Crestor 10mg  daily while at [facility] -- would plan for lipid clinic referral as an outpatient  Currently uninsured, CM consult placed. Wife reports she has been working to try and get him on medicaid   For questions or updates, please contact [facility] Please consult [email or web address] for contact info under    Signed, Laverda Page, NP  03/21/2023, 7:46 AM    Agree with note by Laverda Page NP-C  H/O CAD s/p CABG 2019. Admitted with NSTEMI. Currently pain-free on IV heparin.  Sounds benign.  Coronary angiography today.  He had left radial harvest so cath via Right femoral approach.   I have reviewed the risks, indications, and alternatives to cardiac catheterization, possible angioplasty, and stenting with the patient. Risks include but are not limited to bleeding, infection, vascular injury, stroke, myocardial infection, arrhythmia, kidney injury, radiation-related injury in the case of prolonged fluoroscopy use, emergency cardiac surgery, and death. The patient understands the risks of serious complication is 1-2 in 1000 with diagnostic cardiac cath and 1-2% or less with  angioplasty/stenting.    Runell Gess, M.D., FACP, [facility], [facility] [address] [place], [place]  [address]  [phone] 03/21/2023 10:24 AM

## 2023-03-21 NOTE — Progress Notes (Signed)
Site area: rt groin 14F arterial sheath Site Prior to Removal:  Level 0 Pressure Applied For: 30 minutes Manual:   yes Patient Status During Pull:  stable Post Pull Site:  Level 0 Post Pull Instructions Given:  yes Post Pull Pulses Present: rt dp 2+ plpable Dressing Applied:  gauze and tegaderm Bedrest begins @ 1745 Comments:

## 2023-03-21 NOTE — Progress Notes (Signed)
TRH night cross cover note:   I was notified by RN that the patient is continuing to experience some discomfort refractory to existing order for prn acetaminophen.  Patient has an existing order for as needed IV Dilaudid, although the patient notes that he has experienced some nausea in response to this medication, and requests a p.o. alternative for improvement in pain control.  Per brief chart review, it appears that his most recent serum creatinine was 0.72.  I subsequently placed order for prn Percocet 7.5/325 mg PO Q6H prn for pain, and d\c'ed the existing order for as needed IV Dilaudid.    Louis Pigg, DO Hospitalist

## 2023-03-21 NOTE — Plan of Care (Signed)

## 2023-03-21 NOTE — TOC Benefit Eligibility Note (Signed)
Patient Product/process development scientist completed.    The patient is insured through [employer]. Patient has [employer], may use a copay card, and/or apply for patient assistance if available.    Ran test claim for Basaglar Pen and the current 30 day co-pay is $311.55 due to a $4500 deductible.  Ran test claim for prasugrel (Effient) 10 mg tablets and the current 30 day co-pay is $11.91  Ran test claim for Brilinta 90 mg and Non Formulary  Ran test claim for Lantus Pen and Non Formulary  This test claim was processed through [employer]- copay amounts may vary at other pharmacies due to [employer], or as the patient moves through the different stages of their insurance plan.     Roland Earl, CPHT Pharmacy Patient Advocate Specialist [facility] Patient Advocate Team Direct Number: [phone]  Fax: [phone]

## 2023-03-21 NOTE — Progress Notes (Addendum)
Rounding Note    Patient Name: Louis Byrd Date of Encounter: 03/21/2023  [facility] Cardiologist: None   Subjective   No chest pain but feels nauseated this morning with back pain (chronic)  Inpatient Medications    Scheduled Meds:  aspirin  81 mg Oral Pre-Cath   aspirin EC  81 mg Oral Daily   folic acid  1 mg Oral Daily   insulin aspart  0-15 Units Subcutaneous Q4H   insulin glargine-yfgn  12 Units Subcutaneous Daily   levothyroxine  137 mcg Oral Q0600   lisinopril  10 mg Oral Daily   multivitamin with minerals  1 tablet Oral Daily   nitroGLYCERIN  1 inch Topical Q6H   sodium chloride flush  3 mL Intravenous Q12H   thiamine  100 mg Oral Daily   Or   thiamine  100 mg Intravenous Daily   Continuous Infusions:  sodium chloride     Followed by   sodium chloride     heparin 1,150 Units/hr (03/21/23 0346)   PRN Meds: acetaminophen **OR** acetaminophen, HYDROmorphone (DILAUDID) injection, LORazepam **OR** LORazepam, ondansetron **OR** ondansetron (ZOFRAN) IV, senna-docusate   Vital Signs    Vitals:   03/20/23 1700 03/20/23 2000 03/21/23 0409  BP: (!) 141/92  (!) 143/92  Pulse: 60  (!) 53  Resp: 18  18  Temp: 98.1 F (36.7 C)  98.3 F (36.8 C)  TempSrc: Oral  Oral  SpO2: 100%    Weight:  79.4 kg 80.9 kg  Height:  5\' 9"  (1.753 m)     Intake/Output Summary (Last 24 hours) at 03/21/2023 0746 Last data filed at 03/21/2023 0346 Gross per 24 hour  Intake 0 ml  Output 700 ml  Net -700 ml      03/21/2023    4:09 AM 03/20/2023    8:00 PM 03/30/2020    9:30 AM  Last 3 Weights  Weight (lbs) 178 lb 5.6 oz 175 lb 189 lb 9.6 oz  Weight (kg) 80.9 kg 79.379 kg 86.002 kg      Telemetry    Sinus Bradycardia - Personally Reviewed  Physical Exam   GEN: No acute distress.   Neck: No JVD Cardiac: RRR, no murmurs, rubs, or gallops.  Respiratory: Clear to auscultation bilaterally. GI: Soft, nontender, non-distended  MS: No edema; No deformity. Neuro:   Nonfocal  Psych: Normal affect   Labs    High Sensitivity Troponin:   Recent Labs  Lab 03/20/23 1858 03/20/23 2151  TROPONINIHS 2,277* 2,262*     Chemistry Recent Labs  Lab 03/20/23 1858 03/20/23 2151 03/21/23 0254  NA 136  --  133*  K 3.0*  --  3.8  CL 102  --  99  CO2 24  --  24  GLUCOSE 128*  --  231*  BUN <5*  --  <5*  CREATININE 0.66  --  0.72  CALCIUM 8.2*  --  8.2*  MG  --  2.0  --   PROT 5.5*  --   --   ALBUMIN 2.7*  --   --   AST 32  --   --   ALT 20  --   --   ALKPHOS 60  --   --   BILITOT 0.8  --   --   GFRNONAA >60  --  >60  ANIONGAP 10  --  10    Lipids No results for input(s): "CHOL", "TRIG", "HDL", "LABVLDL", "LDLCALC", "CHOLHDL" in the last 168 hours.  Hematology  Recent Labs  Lab 03/20/23 1858 03/21/23 0254  WBC 6.1 5.7  RBC 3.62* 3.57*  HGB 11.1* 11.3*  HCT 33.0* 33.0*  MCV 91.2 92.4  MCH 30.7 31.7  MCHC 33.6 34.2  RDW 12.3 12.2  PLT 108* 99*   Thyroid No results for input(s): "TSH", "FREET4" in the last 168 hours.  BNPNo results for input(s): "BNP", "PROBNP" in the last 168 hours.  DDimer No results for input(s): "DDIMER" in the last 168 hours.   Radiology    No results found.  Cardiac Studies   N/a   Patient Profile     47 y.o. male with a hx of obstructive CAD s/p CABG (2019: LIMA-LAD, L radial-OM2, SVG-D1, sequential SVG-PDA and PL), T1DM, HTN, HLD who is being seen 03/20/2023 for the evaluation of concern for NSTEMI at the request of Dr. Allena Katz.   Assessment & Plan    NSTEMI CAD s/p CABG (LIMA-LAD, L radial-OM2, SVG-D1, sequential SVG-PDA and PL) '19 -- troponin I initially up to 0.45, but then peaked at 11. Patient reports he has been having intermittent episodes of chest discomfort for the past several months. Echo done at [facility]  showing LVEF of 55-60%, basal inferior wall hypokinesis, normal RV -- remains on IV heparin -- planned for cardiac cath today -- continue ASA, low statin (intolerance),  lisinopril  Informed Consent   Shared Decision Making/Informed Consent The risks [stroke (1 in 1000), death (1 in 1000), kidney failure [usually temporary] (1 in 500), bleeding (1 in 200), allergic reaction [possibly serious] (1 in 200)], benefits (diagnostic support and management of coronary artery disease) and alternatives of a cardiac catheterization were discussed in detail with Louis Byrd and he is willing to proceed.  Type I DM DKA -- presented to [facility] with elevated and CBGs and found to be in DKA. This is now resolved.  -- Hgb A1c 7.9 -- on SSI, long acting insulin per primary  HTN -- blood pressures stable -- continue lisinopril 10mg  daily, baseline bradycardia (will hold BB for now)  HLD -- reports he has not been on statin therapy PTA, had trouble with myalgias -- was resumed on Crestor 10mg  daily while at [facility] -- would plan for lipid clinic referral as an outpatient  Currently uninsured, CM consult placed. Wife reports she has been working to try and get him on medicaid   For questions or updates, please contact [facility] Please consult [email or web address] for contact info under    Signed, Laverda Page, NP  03/21/2023, 7:46 AM    Agree with note by Laverda Page NP-C  H/O CAD s/p CABG 2019. Admitted with NSTEMI. Currently pain-free on IV heparin.  Sounds benign.  Coronary angiography today.  He had left radial harvest so cath via Right femoral approach.   I have reviewed the risks, indications, and alternatives to cardiac catheterization, possible angioplasty, and stenting with the patient. Risks include but are not limited to bleeding, infection, vascular injury, stroke, myocardial infection, arrhythmia, kidney injury, radiation-related injury in the case of prolonged fluoroscopy use, emergency cardiac surgery, and death. The patient understands the risks of serious complication is 1-2 in 1000 with diagnostic cardiac cath and 1-2% or less with  angioplasty/stenting.    Runell Gess, M.D., FACP, [facility], [facility] [address] [place], [place]  [address]  [phone] 03/21/2023 10:24 AM

## 2023-03-22 ENCOUNTER — Encounter (HOSPITAL_COMMUNITY): Payer: Self-pay | Admitting: Internal Medicine

## 2023-03-22 ENCOUNTER — Other Ambulatory Visit (HOSPITAL_COMMUNITY): Payer: Self-pay

## 2023-03-22 ENCOUNTER — Telehealth: Payer: Self-pay | Admitting: Cardiology

## 2023-03-22 DIAGNOSIS — I214 Non-ST elevation (NSTEMI) myocardial infarction: Secondary | ICD-10-CM | POA: Diagnosis not present

## 2023-03-22 LAB — LIPID PANEL
Cholesterol: 178 mg/dL (ref 0–200)
HDL: 65 mg/dL (ref >40)
LDL Cholesterol: 86 mg/dL (ref 0–99)
Total CHOL/HDL Ratio: 2.7 RATIO
Triglycerides: 135 mg/dL (ref <150)
VLDL: 27 mg/dL (ref 0–40)

## 2023-03-22 LAB — GLUCOSE, CAPILLARY
Glucose-Capillary: 149 mg/dL — ABNORMAL HIGH (ref 70–99)
Glucose-Capillary: 163 mg/dL — ABNORMAL HIGH (ref 70–99)

## 2023-03-22 MED ORDER — POTASSIUM CHLORIDE CRYS ER 20 MEQ PO TBCR
40.0000 meq | EXTENDED_RELEASE_TABLET | ORAL | Status: DC
  Administered 2023-03-22: 40 meq via ORAL
  Filled 2023-03-22: qty 2

## 2023-03-22 MED ORDER — PRASUGREL HCL 10 MG PO TABS
ORAL_TABLET | ORAL | 3 refills | Status: AC
Start: 2023-03-22 — End: 2023-04-21
  Filled 2023-03-22: qty 35, 30d supply, fill #0
  Filled 2023-04-19: qty 30, 30d supply, fill #1

## 2023-03-22 MED ORDER — ROSUVASTATIN CALCIUM 10 MG PO TABS
10.0000 mg | ORAL_TABLET | Freq: Every day | ORAL | 0 refills | Status: DC
  Filled 2023-03-22: qty 30, 30d supply, fill #0

## 2023-03-22 MED ORDER — PRASUGREL HCL 10 MG PO TABS
60.0000 mg | ORAL_TABLET | Freq: Once | ORAL | 0 refills | Status: AC
Start: 2023-03-22 — End: 2023-03-23
  Filled 2023-03-22: qty 1, fill #0

## 2023-03-22 MED ORDER — GABAPENTIN 300 MG PO CAPS
300.0000 mg | ORAL_CAPSULE | Freq: Every day | ORAL | 0 refills | Status: DC
  Filled 2023-03-22: qty 30, 30d supply, fill #0

## 2023-03-22 NOTE — Evaluation (Signed)
Physical Therapy Evaluation and D/C Patient Details Name: Louis Byrd MRN: [id number] DOB: 25-Jan-1976 Today's Date: 03/22/2023  History of Present Illness  Patient is a 47 yo male that presents to [facility] for NSTEMI s/p heart cath and DKA. PMHx: CAD s/p CABG, DM, HLD, HTN, hypothyroidism, ETOH use disorder  Clinical Impression  Pt admitted with above diagnosis. Pt without significant mobility deficits and most likely will d/c today. Did educate pt and wife regarding energy conservation techniques as pt gets DOE at times.  Will not follow as pt without significant functional limitations.  D/C PT.    If plan is discharge home, recommend the following:     Can travel by private vehicle        Equipment Recommendations None recommended by PT  Recommendations for Other Services       Functional Status Assessment Patient has not had a recent decline in their functional status     Precautions / Restrictions Precautions Precautions: None Restrictions Weight Bearing Restrictions: No      Mobility  Bed Mobility Overal bed mobility: Independent                  Transfers Overall transfer level: Independent Equipment used: None                    Ambulation/Gait Ambulation/Gait assistance: Independent Gait Distance (Feet): 100 Feet Assistive device: None         General Gait Details: No issues with gait and no LOB  Stairs            Wheelchair Mobility     Tilt Bed    Modified Rankin (Stroke Patients Only)       Balance Overall balance assessment: Independent                                           Pertinent Vitals/Pain Pain Assessment Pain Assessment: 0-10 Pain Score: 8  Breathing: normal Pain Location: back and L shoulder Pain Descriptors / Indicators: Aching Pain Intervention(s): Limited activity within patient's tolerance, Monitored during session, Repositioned    Home Living Family/patient expects to  be discharged to:: Private residence Living Arrangements: Spouse/significant other;Children Available Help at Discharge: Family Type of Home: House Home Access: Level entry       Home Layout: One level (with basement) Home Equipment: None      Prior Function Prior Level of Function : Independent/Modified Independent                     Extremity/Trunk Assessment   Upper Extremity Assessment Upper Extremity Assessment: Defer to OT evaluation LUE: Shoulder pain with ROM LUE Sensation: WNL LUE Coordination: WNL    Lower Extremity Assessment Lower Extremity Assessment: Overall WFL for tasks assessed    Cervical / Trunk Assessment Cervical / Trunk Assessment: Normal  Communication   Communication Communication: No apparent difficulties  Cognition Arousal: Alert Behavior During Therapy: WFL for tasks assessed/performed Overall Cognitive Status: Within Functional Limits for tasks assessed                                          General Comments General comments (skin integrity, edema, etc.): Discussed energy conservation with pt and wife and gave handout regarding energy conservation  and pt and wife appreciative.  Pt gets SOB with incr activity.    Exercises     Assessment/Plan    PT Assessment Patient does not need any further PT services  PT Problem List         PT Treatment Interventions      PT Goals (Current goals can be found in the Care Plan section)  Acute Rehab PT Goals Patient Stated Goal: to go home today PT Goal Formulation: All assessment and education complete, DC therapy    Frequency       Co-evaluation               AM-PAC PT "6 Clicks" Mobility  Outcome Measure Help needed turning from your back to your side while in a flat bed without using bedrails?: None Help needed moving from lying on your back to sitting on the side of a flat bed without using bedrails?: None Help needed moving to and from a bed to a  chair (including a wheelchair)?: None Help needed standing up from a chair using your arms (e.g., wheelchair or bedside chair)?: None Help needed to walk in hospital room?: None Help needed climbing 3-5 steps with a railing? : None 6 Click Score: 24    End of Session   Activity Tolerance: Patient tolerated treatment well Patient left: in bed;with call bell/phone within reach;with family/visitor present Nurse Communication: Mobility status PT Visit Diagnosis: Muscle weakness (generalized) (M62.81)    Time: 1610-9604 PT Time Calculation (min) (ACUTE ONLY): 8 min   Charges:   PT Evaluation $PT Eval Low Complexity: 1 Low   PT General Charges $$ ACUTE PT VISIT: 1 Visit         Khloi Rawl M,PT Acute Rehab Services [phone]   Bevelyn Buckles 03/22/2023, 1:21 PM

## 2023-03-22 NOTE — Progress Notes (Signed)
CSW received consult for patient. CSW spoke with patient at bedside. CSW offered patient outpatient substance use treatment services resources. Patient accepted. All questions answered. No further questions reported at this time. 

## 2023-03-22 NOTE — Telephone Encounter (Signed)
   Transition of Care Follow-up Phone Call Request    Patient Name: Louis Byrd Date of Birth: 30-Jan-1976 Date of Encounter: 03/22/2023  Primary Care Provider:  Eloisa Northern, MD Primary Cardiologist:  None  Louis Byrd has been scheduled for a transition of care follow up appointment with a HeartCare provider:  Wallis Bamberg 8/16  Please reach out to Louis Byrd within 48 hours to confirm appointment and review transition of care protocol questionnaire.  Laverda Page, NP  03/22/2023, 10:49 AM

## 2023-03-22 NOTE — Progress Notes (Signed)
CARDIAC REHAB PHASE I    Pt resting in bed, feeling nauseated this morning. Pt does not feel that he can walk in hall right now due to nausea. Asked Surveyor, mining for nausea medication. Pt reports ambulating several times back and forth to bathroom overnight. Tolerated well with no CP,SOB or dizziness. PT/OT are consulted to see pt today as well. Post MI/stent education including site care, restrictions, risk factors, exercise guidelines, NTG use, MI booklet, antiplatelet therapy importance, heart healthy diabetic diet and CRP2 reviewed. All questions and concerns addressed. Will refer to [facility] for CRP2. Will continue to follow.   [phone] Woodroe Chen, RN BSN 03/22/2023 9:36 AM

## 2023-03-22 NOTE — Evaluation (Signed)
Occupational Therapy Evaluation Patient Details Name: ROMMELL MCCUMBERS MRN: [id number] DOB: Jun 09, 1976 Today's Date: 03/22/2023   History of Present Illness Patient is a 47 yo male that presents to [facility] for NSTEMI s/p heart cath and DKA. PMHx: CAD s/p CABG, DM, HLD, HTN, hypothyroidism, ETOH use disorder   Clinical Impression   Patient reports living with his wife in a 1 level home with 0 STE and is Independent in ADLd and IADLs but wife primarily completes ADLs. Patient does not have any DME and currently is Independent for ADLs and functional mobility.  No further OT intervention needed at this time.      If plan is discharge home, recommend the following: Assistance with cooking/housework    Functional Status Assessment  Patient has not had a recent decline in their functional status  Equipment Recommendations       Recommendations for Other Services       Precautions / Restrictions Precautions Precautions: Fall Restrictions Weight Bearing Restrictions: No      Mobility Bed Mobility Overal bed mobility: Independent                  Transfers Overall transfer level: Independent Equipment used: None                      Balance Overall balance assessment: Independent                                         ADL either performed or assessed with clinical judgement   ADL Overall ADL's : Independent                                             Vision Patient Visual Report: No change from baseline       Perception         Praxis         Pertinent Vitals/Pain Pain Assessment Pain Assessment: 0-10 Pain Score: 8  Pain Location: back and L shoulder Pain Descriptors / Indicators: Aching Pain Intervention(s): Monitored during session     Extremity/Trunk Assessment Upper Extremity Assessment Upper Extremity Assessment: LUE deficits/detail (decreased abduction and flexion of LUE due to patient  reporting scapular pain with movement) LUE: Shoulder pain with ROM LUE Sensation: WNL LUE Coordination: WNL   Lower Extremity Assessment Lower Extremity Assessment: Defer to PT evaluation   Cervical / Trunk Assessment Cervical / Trunk Assessment: Normal   Communication Communication Communication: No apparent difficulties   Cognition Arousal: Alert Behavior During Therapy: WFL for tasks assessed/performed Overall Cognitive Status: Within Functional Limits for tasks assessed                                       General Comments       Exercises     Shoulder Instructions      Home Living Family/patient expects to be discharged to:: Private residence Living Arrangements: Spouse/significant other;Children Available Help at Discharge: Family Type of Home: House Home Access: Level entry     Home Layout: One level (with basement)     Bathroom Shower/Tub: Chief Strategy Officer: Standard     Home Equipment: None  Prior Functioning/Environment Prior Level of Function : Independent/Modified Independent                        OT Problem List:        OT Treatment/Interventions:      OT Goals(Current goals can be found in the care plan section) Acute Rehab OT Goals OT Goal Formulation: With patient Time For Goal Achievement: 03/29/23 Potential to Achieve Goals: Good  OT Frequency:      Co-evaluation              AM-PAC OT "6 Clicks" Daily Activity     Outcome Measure Help from another person eating meals?: None Help from another person taking care of personal grooming?: None Help from another person toileting, which includes using toliet, bedpan, or urinal?: None Help from another person bathing (including washing, rinsing, drying)?: None Help from another person to put on and taking off regular upper body clothing?: None Help from another person to put on and taking off regular lower body clothing?: None 6  Click Score: 24   End of Session Nurse Communication: Mobility status  Activity Tolerance: Patient tolerated treatment well Patient left: in bed  OT Visit Diagnosis: Muscle weakness (generalized) (M62.81)                Time: 1610-9604 OT Time Calculation (min): 14 min Charges:  OT General Charges $OT Visit: 1 Visit OT Evaluation $OT Eval Low Complexity: 1 Low Governor Specking OT/L   Denice Paradise 03/22/2023, 11:03 AM

## 2023-03-22 NOTE — Progress Notes (Addendum)
Rounding Note    Patient Name: Louis Byrd Date of Encounter: 03/22/2023  [facility] Cardiologist: None   Subjective   Some nausea, no chest pain. Wife at the bedside.   Inpatient Medications    Scheduled Meds:  aspirin EC  81 mg Oral Daily   folic acid  1 mg Oral Daily   insulin aspart  0-15 Units Subcutaneous Q4H   insulin glargine-yfgn  12 Units Subcutaneous Daily   levothyroxine  137 mcg Oral Q0600   lisinopril  10 mg Oral Daily   multivitamin with minerals  1 tablet Oral Daily   nitroGLYCERIN  1 inch Topical Q6H   rosuvastatin  10 mg Oral Daily   sodium chloride flush  3 mL Intravenous Q12H   sodium chloride flush  3 mL Intravenous Q12H   thiamine  100 mg Oral Daily   Or   thiamine  100 mg Intravenous Daily   ticagrelor  90 mg Oral BID   Continuous Infusions:  sodium chloride     PRN Meds: sodium chloride, acetaminophen **OR** acetaminophen, LORazepam **OR** LORazepam, ondansetron (ZOFRAN) IV, ondansetron **OR** [DISCONTINUED] ondansetron (ZOFRAN) IV, oxyCODONE-acetaminophen, senna-docusate, sodium chloride flush   Vital Signs    Vitals:   03/21/23 1800 03/21/23 2032 03/21/23 2333 03/22/23 0426  BP: 139/80 135/79 (!) 141/88 (!) 149/91  Pulse: 66 74 67 (!) 57  Resp: 16 18 19 20   Temp: 99.3 F (37.4 C) 98.9 F (37.2 C) 98.4 F (36.9 C) 98.5 F (36.9 C)  TempSrc: Oral Oral Oral Oral  SpO2: 99% 98% 99% 97%  Weight:    78.2 kg  Height:        Intake/Output Summary (Last 24 hours) at 03/22/2023 0739 Last data filed at 03/22/2023 0428 Gross per 24 hour  Intake --  Output 2530 ml  Net -2530 ml      03/22/2023    4:26 AM 03/21/2023    4:09 AM 03/20/2023    8:00 PM  Last 3 Weights  Weight (lbs) 172 lb 4.8 oz 178 lb 5.6 oz 175 lb  Weight (kg) 78.155 kg 80.9 kg 79.379 kg      Telemetry    Sinus Bradycardia - Personally Reviewed  ECG    Sinus Bradycardia 51 bpm - Personally Reviewed  Physical Exam   GEN: No acute distress.   Neck: No  JVD Cardiac: RRR, no murmurs, rubs, or gallops.  Respiratory: Clear to auscultation bilaterally. GI: Soft, nontender, non-distended  MS: No edema; No deformity. Femoral cath site stable.  Neuro:  Nonfocal  Psych: Normal affect   Labs    High Sensitivity Troponin:   Recent Labs  Lab 03/20/23 1858 03/20/23 2151  TROPONINIHS 2,277* 2,262*     Chemistry Recent Labs  Lab 03/20/23 1858 03/20/23 2151 03/21/23 0254 03/22/23 0155  NA 136  --  133* 137  K 3.0*  --  3.8 3.1*  CL 102  --  99 97*  CO2 24  --  24 27  GLUCOSE 128*  --  231* 175*  BUN <5*  --  <5* <5*  CREATININE 0.66  --  0.72 0.73  CALCIUM 8.2*  --  8.2* 8.7*  MG  --  2.0  --   --   PROT 5.5*  --   --   --   ALBUMIN 2.7*  --   --   --   AST 32  --   --   --   ALT 20  --   --   --  ALKPHOS 60  --   --   --   BILITOT 0.8  --   --   --   GFRNONAA >60  --  >60 >60  ANIONGAP 10  --  10 13    Lipids  Recent Labs  Lab 03/22/23 0155  CHOL 178  TRIG 135  HDL 65  LDLCALC 86  CHOLHDL 2.7    Hematology Recent Labs  Lab 03/20/23 1858 03/21/23 0254 03/22/23 0155  WBC 6.1 5.7 5.4  RBC 3.62* 3.57* 3.87*  HGB 11.1* 11.3* 12.0*  HCT 33.0* 33.0* 34.3*  MCV 91.2 92.4 88.6  MCH 30.7 31.7 31.0  MCHC 33.6 34.2 35.0  RDW 12.3 12.2 11.9  PLT 108* 99* 120*   Thyroid No results for input(s): "TSH", "FREET4" in the last 168 hours.  BNPNo results for input(s): "BNP", "PROBNP" in the last 168 hours.  DDimer No results for input(s): "DDIMER" in the last 168 hours.   Radiology    CARDIAC CATHETERIZATION  Addendum Date: 03/21/2023     RPAV-2 lesion is 99% stenosed.   RPAV-1 lesion is 50% stenosed.   RPDA lesion is 40% stenosed.   Mid RCA lesion is 90% stenosed.   Origin to Prox Graft lesion before 3rd RPL  is 100% stenosed.   Prox Cx lesion is 90% stenosed.   Mid Cx lesion is 90% stenosed.   Mid LAD lesion is 100% stenosed.   1st Diag lesion is 100% stenosed.   Origin to Prox Graft lesion is 100% stenosed.   A  drug-eluting stent was successfully placed using a STENT ONYX FRONTIER 2.25X12.   A drug-eluting stent was successfully placed using a STENT ONYX FRONTIER 3.0X38.   Post intervention, there is a 0% residual stenosis.   Post intervention, there is a 0% residual stenosis.   SVG graft was visualized by angiography.   LIMA graft was visualized by angiography and is normal in caliber.   SVG graft was visualized by angiography.   Left radial artery graft was visualized by angiography and is normal in caliber.   The left ventricular systolic function is normal.   LV end diastolic pressure is normal.   The left ventricular ejection fraction is greater than 65% by visual estimate. Triple vessel CAD s/p 5V CABG with 2/5 patent bypass grafts Chronic occlusion of the mid LAD. Patent LIMA to LAD. Occluded SVG to Diagonal. The diagonal fills from left to left collaterals. The Circumflex has severe proximal and mid stenosis. The radial artery graft to the obtuse marginal branch is patent The dominant RCA has severe mid vessel disease and severe disease in the posterolateral artery. The Sequential SVG to PDA and PLA is occluded The RCA is now unprotected. Successful PTCA/DES x 2 mid RCA. Successful PTCA/DES x 1 posterolateral artery Recommendations: DAPT with ASA and Brilinta for one year.   Result Date: 03/21/2023   RPAV-2 lesion is 99% stenosed.   RPAV-1 lesion is 50% stenosed.   RPDA lesion is 40% stenosed.   Mid RCA lesion is 90% stenosed.   Origin to Prox Graft lesion before 3rd RPL  is 100% stenosed.   Prox Cx lesion is 90% stenosed.   Mid Cx lesion is 90% stenosed.   Mid LAD lesion is 100% stenosed.   1st Diag lesion is 100% stenosed.   Origin to Prox Graft lesion is 100% stenosed.   A drug-eluting stent was successfully placed using a STENT ONYX FRONTIER 2.25X12.   A drug-eluting stent was successfully placed using  a STENT ONYX FRONTIER 3.0X38.   Post intervention, there is a 0% residual stenosis.   Post intervention,  there is a 0% residual stenosis.   SVG graft was visualized by angiography.   SVG graft was visualized by angiography and is normal in caliber.   LIMA graft was visualized by angiography and is normal in caliber.   SVG graft was visualized by angiography.   The left ventricular systolic function is normal.   LV end diastolic pressure is normal.   The left ventricular ejection fraction is greater than 65% by visual estimate. Triple vessel CAD s/p 5V CABG with 2/5 patent bypass grafts Chronic occlusion of the mid LAD. Patent LIMA to LAD. Occluded SVG to Diagonal. The diagonal fills from left to left collaterals. The Circumflex has severe proximal and mid stenosis. The SVG to the obtuse marginal branch is patent The dominant RCA has severe mid vessel disease and severe disease in the posterolateral artery. The Sequential SVG to PDA and PLA is occluded The RCA is now unprotected. Successful PTCA/DES x 2 mid RCA. Successful PTCA/DES x 1 posterolateral artery Recommendations: DAPT with ASA and Brilinta for one year.    Cardiac Studies   Cath: 03/21/2023    RPAV-2 lesion is 99% stenosed.   RPAV-1 lesion is 50% stenosed.   RPDA lesion is 40% stenosed.   Mid RCA lesion is 90% stenosed.   Origin to Prox Graft lesion before 3rd RPL  is 100% stenosed.   Prox Cx lesion is 90% stenosed.   Mid Cx lesion is 90% stenosed.   Mid LAD lesion is 100% stenosed.   1st Diag lesion is 100% stenosed.   Origin to Prox Graft lesion is 100% stenosed.   A drug-eluting stent was successfully placed using a STENT ONYX FRONTIER 2.25X12.   A drug-eluting stent was successfully placed using a STENT ONYX FRONTIER 3.0X38.   Post intervention, there is a 0% residual stenosis.   Post intervention, there is a 0% residual stenosis.   SVG graft was visualized by angiography.   LIMA graft was visualized by angiography and is normal in caliber.   SVG graft was visualized by angiography.   Left radial artery graft was visualized by  angiography and is normal in caliber.   The left ventricular systolic function is normal.   LV end diastolic pressure is normal.   The left ventricular ejection fraction is greater than 65% by visual estimate.   Triple vessel CAD s/p 5V CABG with 2/5 patent bypass grafts Chronic occlusion of the mid LAD. Patent LIMA to LAD. Occluded SVG to Diagonal. The diagonal fills from left to left collaterals.  The Circumflex has severe proximal and mid stenosis. The radial artery graft to the obtuse marginal branch is patent The dominant RCA has severe mid vessel disease and severe disease in the posterolateral artery. The Sequential SVG to PDA and PLA is occluded The RCA is now unprotected. Successful PTCA/DES x 2 mid RCA. Successful PTCA/DES x 1 posterolateral artery   Recommendations: DAPT with ASA and Brilinta for one year.    Diagnostic Dominance: Right  Intervention    Patient Profile     47 y.o. male  with a hx of obstructive CAD s/p CABG (2019: LIMA-LAD, L radial-OM2, SVG-D1, sequential SVG-PDA and PL), T1DM, HTN, HLD who is being seen 03/20/2023 for the evaluation of concern for NSTEMI at the request of Dr. Allena Katz.     Assessment & Plan    NSTEMI CAD s/p CABG (LIMA-LAD, L  radial-OM2, SVG-D1, sequential SVG-PDA and PL) '19 -- troponin I initially up to 0.45, but then peaked at 11. Patient reported he had been having intermittent episodes of chest discomfort for the past several months. Echo done at [facility]  showing LVEF of 55-60%, basal inferior wall hypokinesis, normal RV.  -- Underwent cardiac cath noted above with 2/5 patent grafts (LIMA-LAD and SVG-OM) PCI/DES x2 to mRCA, DES x1 to PLA. Recommendations for DAPT with ASA/Brilinta for at least one year -- seen by CR   Type I DM DKA -- presented to [facility] with elevated and CBGs and found to be in DKA. This is now resolved.  -- Hgb A1c 7.9 -- on SSI, long acting insulin per primary   HTN -- blood pressures  stable -- continue lisinopril 10mg  daily, baseline bradycardia (will hold BB for now)   HLD -- reports he has not been on statin therapy PTA, had trouble with myalgias -- was resumed on Crestor 10mg  daily while at [facility] -- would plan for lipid clinic referral as an outpatient  [facility] will sign off.   Medication Recommendations:  noted above Other recommendations (labs, testing, etc):  N/a  Follow up as an outpatient:  will arrange TOC  For questions or updates, please contact [facility] Please consult [email or web address] for contact info under        Signed, Laverda Page, NP  03/22/2023, 7:39 AM     Agree with note by Laverda Page NP-C  Mr. Earlene Plater was admitted with a non-STEMI.  He is 5 years status post CABG followed by Dr. Bing Matter and As[facility].  He underwent left heart cath by Dr. Clifton James yesterday revealing patent grafts except for an occluded graft to the distal RCA.  Dr. Clifton James placed 2 stents in the native right revascularize it completely.  LV function was normal at [facility] with some inferior wall motion abnormality.  He is pain-free this morning.  Exam is benign.  Risk Berry some nausea.  He is stable for discharge on DAPT including aspirin and Brilinta.  Will arrange outpatient follow-up.  Runell Gess, M.D., FACP, [facility], [facility], [facility] [address] [place], [place]  [address]  [phone] 03/22/2023 10:24 AM    In review, patient's new insurance will not cover Brilinta. Will plan to switch from Brilinta to Effient with 60mg  load, then 10mg  daily.   Janice Coffin, NP-C 03/22/2023, 10:38 AM Pager: [phone]

## 2023-03-22 NOTE — Discharge Summary (Signed)
Physician Discharge Summary  Louis Byrd NGE:[id number] DOB: Jun 05, 1976 DOA: 03/20/2023  PCP: Eloisa Northern, MD  Admit date: 03/20/2023 Discharge date: 03/22/2023 30 Day Unplanned Readmission Risk Score    Flowsheet Row Admission (Current) from 03/20/2023 in [place] [facility]  30 Day Unplanned Readmission Risk Score (%) 11.28 Filed at 03/22/2023 0801       This score is the patient's risk of an unplanned readmission within 30 days of being discharged (0 -100%). The score is based on dignosis, age, lab data, medications, orders, and past utilization.   Low:  0-14.9   Medium: 15-21.9   High: 22-29.9   Extreme: 30 and above          Admitted From: [facility] Disposition: Home  Recommendations for Outpatient Follow-up:  Follow up with PCP in 1-2 weeks Please obtain BMP/CBC in one week Follow-up with cardiology per their recommendations Please follow up with your PCP on the following pending results: Unresulted Labs (From admission, onward)     Start     Ordered   03/22/23 0500  CBC  Daily,   R      03/20/23 2032              Home Health: None Equipment/Devices: None  Discharge Condition: Stable CODE STATUS: Full code Diet recommendation: Cardiac  Subjective: Seen and examined.  Wife at the bedside.  Complains of mild nausea but he is not much concerned and he is still willing to go home.  No other complaint.  Cleared by cardiology.  Brief/Interim Summary: Louis Byrd is a 47 y.o. 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   Patient transferred from [facility] where he was admitted early morning 8/4.  He was initially admitted for management of DKA which has been treated and resolved. During admission he was noted to have elevated troponin with peak troponin level 11.4 before trending down. Echocardiogram 8/4  showed EF 55-60%, no regional wall motion abnormality. He was seen by cardiology Dr. Bing Matter, who recommended transfer to [facility] for consideration of cardiac cath.    Patient does state that he is an alcoholic and drinks about 5-6 25 oz beers daily.  Last drink was approximately on 03/16/2023.  He reports occasional marijuana use as well as using tobacco dip but denies smoking cigarettes.  He denies any cocaine or injection drug use.  Details of the hospitalization as below.   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  Seen by cardiology, started on IV heparin, underwent cardiac cath noted above with 2/5 patent grafts (LIMA-LAD and SVG-OM) PCI/DES x2 to mRCA, DES x1 to PLA. Recommendations for DAPT with ASA/Effient for at least one year per cardiology.   Type 1 diabetes: Hemoglobin A1c 7.9.  Treated for DKA at [facility] prior to transfer which has since resolved.  Uses 20 units of Lantus at home, currently on 12 units and SSI, hyperglycemic, resume PTA medications.   Alcohol use disorder: Patient reports drinking 5-6 25 oz beers daily with last drink about 4 days prior to this admission.  Started on CIWA protocol with as needed Ativan but his CIWA score was very low and he did not require any medications at all.  Had a lengthy discussion with patient and counseled him about quitting alcohol.  He appears motivated but was requesting some medications to help.  Sending him  on gabapentin 300 mg p.o. daily.  Confirmed with pharmacy, no interaction with any medications.   Hypertension: Blood pressure stable.  Continue lisinopril 10 mg daily.   Hypokalemia: Resolved.   Hyperlipidemia: Starting on Crestor.   Hypothyroidism: Continue Synthroid.   Acute thrombocytopenia: Mild without obvious bleeding.  Monitor while on IV heparin.  Improving  Sinus bradycardia: Beta-blocker held and is being discontinued at time of discharge per cardiology recommendations.  Discharge plan was  discussed with patient and/or family member and they verbalized understanding and agreed with it.  Discharge Diagnoses:  Principal Problem:   NSTEMI (non-ST elevated myocardial infarction) (HCC) Active Problems:   Coronary artery disease   Type 1 diabetes (HCC)   Alcohol use disorder   Hyperlipidemia associated with type 2 diabetes mellitus (HCC)   Hypothyroidism   Hypertension associated with diabetes (HCC)   Hypokalemia   Thrombocytopenia (HCC)    Discharge Instructions  Discharge Instructions     Amb Referral to Cardiac Rehabilitation   Complete by: As directed    Diagnosis:  NSTEMI Coronary Stents     After initial evaluation and assessments completed: Virtual Based Care may be provided alone or in conjunction with Phase 2 Cardiac Rehab based on patient barriers.: Yes   Intensive Cardiac Rehabilitation (ICR) [facility] location only OR Traditional Cardiac Rehabilitation (TCR) *If criteria for ICR are not met will enroll in TCR [facility] only): Yes      Allergies as of 03/22/2023       Reactions   Morphine Nausea And Vomiting        Medication List     STOP taking these medications    carvedilol 6.25 MG tablet Commonly known as: COREG   METOPROLOL SUCCINATE PO       TAKE these medications    aspirin EC 81 MG tablet Take 1 tablet (81 mg total) by mouth daily.   Basaglar KwikPen 100 UNIT/ML Inject 20 Units into the skin daily.   fluticasone 50 MCG/ACT nasal spray Commonly known as: FLONASE Place 1 spray into both nostrils at bedtime.   gabapentin 300 MG capsule Commonly known as: Neurontin Take 1 capsule (300 mg total) by mouth daily.   HumaLOG 100 UNIT/ML injection Generic drug: insulin lispro Inject 3-12 Units into the skin daily as needed.   lisinopril 10 MG tablet Commonly known as: ZESTRIL Take 10 mg by mouth daily.   omeprazole 20 MG capsule Commonly known as: PRILOSEC Take 40 mg by mouth in the morning and at bedtime.   prasugrel 10 MG Tabs  tablet Commonly known as: EFFIENT Take 6 tablets (60 mg total) by mouth once for 1 dose. Take 60mg  (6 tablets) on the evening of 8/8   prasugrel 10 MG Tabs tablet Commonly known as: EFFIENT Take 1 tablet (10 mg total) by mouth daily. Start taking on: March 23, 2023   rosuvastatin 10 MG tablet Commonly known as: CRESTOR Take 1 tablet (10 mg total) by mouth daily. Start taking on: March 23, 2023   Synthroid 137 MCG tablet Generic drug: levothyroxine Take 137 mcg by mouth daily.        Follow-up Information     Eloisa Northern, MD Follow up in 1 week(s).   Specialty: Internal Medicine Contact information: [address] [place] [address] [phone]                Allergies  Allergen Reactions   Morphine Nausea And Vomiting    Consultations: Cardiology   Procedures/Studies: CARDIAC  CATHETERIZATION  Addendum Date: 03/21/2023     RPAV-2 lesion is 99% stenosed.   RPAV-1 lesion is 50% stenosed.   RPDA lesion is 40% stenosed.   Mid RCA lesion is 90% stenosed.   Origin to Prox Graft lesion before 3rd RPL  is 100% stenosed.   Prox Cx lesion is 90% stenosed.   Mid Cx lesion is 90% stenosed.   Mid LAD lesion is 100% stenosed.   1st Diag lesion is 100% stenosed.   Origin to Prox Graft lesion is 100% stenosed.   A drug-eluting stent was successfully placed using a STENT ONYX FRONTIER 2.25X12.   A drug-eluting stent was successfully placed using a STENT ONYX FRONTIER 3.0X38.   Post intervention, there is a 0% residual stenosis.   Post intervention, there is a 0% residual stenosis.   SVG graft was visualized by angiography.   LIMA graft was visualized by angiography and is normal in caliber.   SVG graft was visualized by angiography.   Left radial artery graft was visualized by angiography and is normal in caliber.   The left ventricular systolic function is normal.   LV end diastolic pressure is normal.   The left ventricular ejection fraction is greater than 65% by visual  estimate. Triple vessel CAD s/p 5V CABG with 2/5 patent bypass grafts Chronic occlusion of the mid LAD. Patent LIMA to LAD. Occluded SVG to Diagonal. The diagonal fills from left to left collaterals. The Circumflex has severe proximal and mid stenosis. The radial artery graft to the obtuse marginal branch is patent The dominant RCA has severe mid vessel disease and severe disease in the posterolateral artery. The Sequential SVG to PDA and PLA is occluded The RCA is now unprotected. Successful PTCA/DES x 2 mid RCA. Successful PTCA/DES x 1 posterolateral artery Recommendations: DAPT with ASA and Brilinta for one year.   Result Date: 03/21/2023   RPAV-2 lesion is 99% stenosed.   RPAV-1 lesion is 50% stenosed.   RPDA lesion is 40% stenosed.   Mid RCA lesion is 90% stenosed.   Origin to Prox Graft lesion before 3rd RPL  is 100% stenosed.   Prox Cx lesion is 90% stenosed.   Mid Cx lesion is 90% stenosed.   Mid LAD lesion is 100% stenosed.   1st Diag lesion is 100% stenosed.   Origin to Prox Graft lesion is 100% stenosed.   A drug-eluting stent was successfully placed using a STENT ONYX FRONTIER 2.25X12.   A drug-eluting stent was successfully placed using a STENT ONYX FRONTIER 3.0X38.   Post intervention, there is a 0% residual stenosis.   Post intervention, there is a 0% residual stenosis.   SVG graft was visualized by angiography.   SVG graft was visualized by angiography and is normal in caliber.   LIMA graft was visualized by angiography and is normal in caliber.   SVG graft was visualized by angiography.   The left ventricular systolic function is normal.   LV end diastolic pressure is normal.   The left ventricular ejection fraction is greater than 65% by visual estimate. Triple vessel CAD s/p 5V CABG with 2/5 patent bypass grafts Chronic occlusion of the mid LAD. Patent LIMA to LAD. Occluded SVG to Diagonal. The diagonal fills from left to left collaterals. The Circumflex has severe proximal and mid stenosis. The  SVG to the obtuse marginal branch is patent The dominant RCA has severe mid vessel disease and severe disease in the posterolateral artery. The Sequential SVG to PDA  and PLA is occluded The RCA is now unprotected. Successful PTCA/DES x 2 mid RCA. Successful PTCA/DES x 1 posterolateral artery Recommendations: DAPT with ASA and Brilinta for one year.     Discharge Exam: Vitals:   03/22/23 0426 03/22/23 0700  BP: (!) 149/91 (!) 153/87  Pulse: (!) 57   Resp: 20 17  Temp: 98.5 F (36.9 C) 97.9 F (36.6 C)  SpO2: 97%    Vitals:   03/21/23 2032 03/21/23 2333 03/22/23 0426 03/22/23 0700  BP: 135/79 (!) 141/88 (!) 149/91 (!) 153/87  Pulse: 74 67 (!) 57   Resp: 18 19 20 17   Temp: 98.9 F (37.2 C) 98.4 F (36.9 C) 98.5 F (36.9 C) 97.9 F (36.6 C)  TempSrc: Oral Oral Oral Oral  SpO2: 98% 99% 97%   Weight:   78.2 kg   Height:        General: Pt is alert, awake, not in acute distress Cardiovascular: RRR, S1/S2 +, no rubs, no gallops Respiratory: CTA bilaterally, no wheezing, no rhonchi Abdominal: Soft, NT, ND, bowel sounds + Extremities: no edema, no cyanosis    The results of significant diagnostics from this hospitalization (including imaging, microbiology, ancillary and laboratory) are listed below for reference.     Microbiology: No results found for this or any previous visit (from the past 240 hour(s)).   Labs: BNP (last 3 results) No results for input(s): "BNP" in the last 8760 hours. Basic Metabolic Panel: Recent Labs  Lab 03/20/23 1858 03/20/23 2151 03/21/23 0254 03/22/23 0155  NA 136  --  133* 137  K 3.0*  --  3.8 3.1*  CL 102  --  99 97*  CO2 24  --  24 27  GLUCOSE 128*  --  231* 175*  BUN <5*  --  <5* <5*  CREATININE 0.66  --  0.72 0.73  CALCIUM 8.2*  --  8.2* 8.7*  MG  --  2.0  --   --    Liver Function Tests: Recent Labs  Lab 03/20/23 1858  AST 32  ALT 20  ALKPHOS 60  BILITOT 0.8  PROT 5.5*  ALBUMIN 2.7*   No results for input(s): "LIPASE",  "AMYLASE" in the last 168 hours. No results for input(s): "AMMONIA" in the last 168 hours. CBC: Recent Labs  Lab 03/20/23 1858 03/21/23 0254 03/22/23 0155  WBC 6.1 5.7 5.4  HGB 11.1* 11.3* 12.0*  HCT 33.0* 33.0* 34.3*  MCV 91.2 92.4 88.6  PLT 108* 99* 120*   Cardiac Enzymes: No results for input(s): "CKTOTAL", "CKMB", "CKMBINDEX", "TROPONINI" in the last 168 hours. BNP: Invalid input(s): "POCBNP" CBG: Recent Labs  Lab 03/21/23 1842 03/21/23 2012 03/21/23 2341 03/22/23 0420 03/22/23 0750  GLUCAP 185* 188* 195* 149* 163*   D-Dimer No results for input(s): "DDIMER" in the last 72 hours. Hgb A1c Recent Labs    03/21/23 0254  HGBA1C 7.9*   Lipid Profile Recent Labs    03/22/23 0155  CHOL 178  HDL 65  LDLCALC 86  TRIG 135  CHOLHDL 2.7   Thyroid function studies No results for input(s): "TSH", "T4TOTAL", "T3FREE", "THYROIDAB" in the last 72 hours.  Invalid input(s): "FREET3" Anemia work up No results for input(s): "VITAMINB12", "FOLATE", "FERRITIN", "TIBC", "IRON", "RETICCTPCT" in the last 72 hours. Urinalysis    Component Value Date/Time   COLORURINE STRAW (A) 09/27/2017 1411   APPEARANCEUR CLEAR 09/27/2017 1411   LABSPEC 1.014 09/27/2017 1411   PHURINE 7.0 09/27/2017 1411   GLUCOSEU >=500 (A) 09/27/2017 1411  HGBUR NEGATIVE 09/27/2017 1411   BILIRUBINUR NEGATIVE 09/27/2017 1411   KETONESUR NEGATIVE 09/27/2017 1411   PROTEINUR NEGATIVE 09/27/2017 1411   NITRITE NEGATIVE 09/27/2017 1411   LEUKOCYTESUR NEGATIVE 09/27/2017 1411   Sepsis Labs Recent Labs  Lab 03/20/23 1858 03/21/23 0254 03/22/23 0155  WBC 6.1 5.7 5.4   Microbiology No results found for this or any previous visit (from the past 240 hour(s)).  FURTHER DISCHARGE INSTRUCTIONS:   Get Medicines reviewed and adjusted: Please take all your medications with you for your next visit with your Primary MD   Laboratory/radiological data: Please request your Primary MD to go over all  hospital tests and procedure/radiological results at the follow up, please ask your Primary MD to get all Hospital records sent to his/her office.   In some cases, they will be blood work, cultures and biopsy results pending at the time of your discharge. Please request that your primary care M.D. goes through all the records of your hospital data and follows up on these results.   Also Note the following: If you experience worsening of your admission symptoms, develop shortness of breath, life threatening emergency, suicidal or homicidal thoughts you must seek medical attention immediately by calling 911 or calling your MD immediately  if symptoms less severe.   You must read complete instructions/literature along with all the possible adverse reactions/side effects for all the Medicines you take and that have been prescribed to you. Take any new Medicines after you have completely understood and accpet all the possible adverse reactions/side effects.    Do not drive when taking Pain medications or sleeping medications (Benzodaizepines)   Do not take more than prescribed Pain, Sleep and Anxiety Medications. It is not advisable to combine anxiety,sleep and pain medications without talking with your primary care practitioner   Special Instructions: If you have smoked or chewed Tobacco  in the last 2 yrs please stop smoking, stop any regular Alcohol  and or any Recreational drug use.   Wear Seat belts while driving.   Please note: You were cared for by a hospitalist during your hospital stay. Once you are discharged, your primary care physician will handle any further medical issues. Please note that NO REFILLS for any discharge medications will be authorized once you are discharged, as it is imperative that you return to your primary care physician (or establish a relationship with a primary care physician if you do not have one) for your post hospital discharge needs so that they can reassess your  need for medications and monitor your lab values  Time coordinating discharge: Over 30 minutes  SIGNED:   Hughie Closs, MD  [facility] 03/22/2023, 10:52 AM *Please note that this is a verbal dictation therefore any spelling or grammatical errors are due to the "[employer] One" system interpretation. If 7PM-7AM, please contact night-coverage [email or web address]

## 2023-03-22 NOTE — Plan of Care (Signed)

## 2023-03-26 NOTE — Telephone Encounter (Signed)
Left message for the patient to call back.

## 2023-03-27 ENCOUNTER — Telehealth (HOSPITAL_COMMUNITY): Payer: Self-pay

## 2023-03-27 MED ORDER — NITROGLYCERIN 0.4 MG SL SUBL
0.4000 mg | SUBLINGUAL_TABLET | SUBLINGUAL | 6 refills | Status: DC | PRN
Start: 2023-03-27 — End: 2024-03-11

## 2023-03-27 NOTE — Telephone Encounter (Signed)
Patient contacted regarding discharge from [facility] on 03/22/23.  Patient understands to follow up with provider Wallis Bamberg, NP  on 03/30/23 at 1055 at [facility]. Patient understands discharge instructions? yes Patient understands medications and regiment? yes Patient understands to bring all medications to this visit? yes  Ask patient:  Are you enrolled in My Chart yes.

## 2023-03-27 NOTE — Addendum Note (Signed)
Addended by: Eleonore Chiquito on: 03/27/2023 04:48 PM   Modules accepted: Orders

## 2023-03-27 NOTE — Telephone Encounter (Signed)
Per phase I cardiac rehab, fax referral to [place].

## 2023-03-27 NOTE — Telephone Encounter (Signed)
Called and left VM for pt to callback regarding recent discharge. DC's 03/22/23 from [facility] FU 03/30/23 at 1055 with Anson [place], NP

## 2023-03-29 NOTE — Progress Notes (Signed)
Cardiology Office Note:  .   Date:  03/30/2023  ID:  Louis Byrd, DOB 24-Mar-1976, MRN [id number] PCP: Louis Northern, MD  [facility] Providers Cardiologist:  None    History of Present Illness: .   Louis Byrd is a 47 y.o. male with a past medical history of CAD/s/p CABGx5 2019; PTCA/DES x 2 mRCA & x1 RPAV, hypertension, DM 1, hypothyroidism seizures, alcohol abuse..  Evaluated by Dr. Bing Matter on 03/30/2020, he had some angina and a repeat Lexiscan was ordered however does not appear this was ever completed.  It appears he was lost to follow-up after this.  Admitted to [facility] as a transfer from [facility] for evaluation of non-STEMI, he had been admitted to [facility] for management of DKA.  Troponin 2277>>2262.  Echocardiogram revealed an EF 55 to 60%, no RWMA. LHC on 03/21/23 >> 2/5 patent grafts (LIMA-LAD and SVG-OM) PCI/DES x2 to mRCA, DES x1 to PLA. Recommendations for DAPT with ASA/Effient for at least one year per cardiology.   He presents today for follow-up after his hospitalization as outlined above.  He is very committed to change in his lifestyle, complete cessation from alcohol with the help of gabapentin x 13 days.  He continues to have some atypical type anginal pains, sharp/fleeting, can occur at rest or sometimes with exertion.  He did hear from cardiac rehab, plans to meet with him on Monday to start this.  He denies chest pain, palpitations, dyspnea, pnd, orthopnea, n, v, dizziness, syncope, edema, weight gain, or early satiety.   ROS: Review of Systems  Cardiovascular:  Positive for chest pain.  All other systems reviewed and are negative.    Studies Reviewed: .        Cardiac Studies & Procedures   CARDIAC CATHETERIZATION  CARDIAC CATHETERIZATION 03/21/2023  Narrative   RPAV-2 lesion is 99% stenosed.   RPAV-1 lesion is 50% stenosed.   RPDA lesion is 40% stenosed.   Mid RCA lesion is 90% stenosed.   Origin to Prox Graft lesion before  3rd RPL  is 100% stenosed.   Prox Cx lesion is 90% stenosed.   Mid Cx lesion is 90% stenosed.   Mid LAD lesion is 100% stenosed.   1st Diag lesion is 100% stenosed.   Origin to Prox Graft lesion is 100% stenosed.   A drug-eluting stent was successfully placed using a STENT ONYX FRONTIER 2.25X12.   A drug-eluting stent was successfully placed using a STENT ONYX FRONTIER 3.0X38.   Post intervention, there is a 0% residual stenosis.   Post intervention, there is a 0% residual stenosis.   SVG graft was visualized by angiography.   LIMA graft was visualized by angiography and is normal in caliber.   SVG graft was visualized by angiography.   Left radial artery graft was visualized by angiography and is normal in caliber.   The left ventricular systolic function is normal.   LV end diastolic pressure is normal.   The left ventricular ejection fraction is greater than 65% by visual estimate.  Triple vessel CAD s/p 5V CABG with 2/5 patent bypass grafts Chronic occlusion of the mid LAD. Patent LIMA to LAD. Occluded SVG to Diagonal. The diagonal fills from left to left collaterals. The Circumflex has severe proximal and mid stenosis. The radial artery graft to the obtuse marginal branch is patent The dominant RCA has severe mid vessel disease and severe disease in the posterolateral artery. The Sequential SVG to PDA and PLA  is occluded The RCA is now unprotected. Successful PTCA/DES x 2 mid RCA. Successful PTCA/DES x 1 posterolateral artery  Recommendations: DAPT with ASA and Brilinta for one year.  Findings Coronary Findings Diagnostic  Dominance: Right  Left Anterior Descending Vessel is large. Mid LAD lesion is 100% stenosed. The lesion is chronically occluded.  First Diagonal Branch Collaterals 1st Diag filled by collaterals from 3rd Diag.  1st Diag lesion is 100% stenosed. The lesion is chronically occluded.  Left Circumflex Vessel is large. Prox Cx lesion is 90% stenosed. Mid  Cx lesion is 90% stenosed.  Right Coronary Artery Vessel is large. Mid RCA lesion is 90% stenosed.  Right Posterior Descending Artery RPDA lesion is 40% stenosed.  Right Posterior Atrioventricular Artery RPAV-1 lesion is 50% stenosed. RPAV-2 lesion is 99% stenosed.  Sequential Saphenous Graft To 3rd RPL, RPDA SVG graft was visualized by angiography. Origin to Prox Graft lesion before 3rd RPL  is 100% stenosed. The lesion is chronically occluded.  Left Radial Artery Graft To 2nd Mrg Left radial artery graft was visualized by angiography and is normal in caliber.  LIMA LIMA Graft To Dist LAD LIMA graft was visualized by angiography and is normal in caliber.  Saphenous Graft To 1st Diag SVG graft was visualized by angiography. Origin to Prox Graft lesion is 100% stenosed. The lesion is chronically occluded.  Intervention  Mid RCA lesion Stent CATH LAUNCHER 6FR JR4 guide catheter was inserted. Lesion crossed with guidewire using a WIRE COUGAR XT STRL 190CM. Pre-stent angioplasty was performed using a BALLN SAPPHIRE 2.0X12. A drug-eluting stent was successfully placed using a STENT ONYX FRONTIER 3.0X38. Stent strut is well apposed. Post-stent angioplasty was performed using a BALLN [place] EMERGE MR 3.25X20. Post-Intervention Lesion Assessment The intervention was successful. Pre-interventional TIMI flow is 3. Post-intervention TIMI flow is 3. No complications occurred at this lesion. There is a 0% residual stenosis post intervention.  RPAV-2 lesion Stent CATH LAUNCHER 6FR JR4 guide catheter was inserted. Lesion crossed with guidewire using a WIRE COUGAR XT STRL 190CM. Pre-stent angioplasty was performed using a BALLN SAPPHIRE 2.0X12. A drug-eluting stent was successfully placed using a STENT ONYX FRONTIER 2.25X12. Stent strut is well apposed. Post-stent angioplasty was not performed. Post-Intervention Lesion Assessment The intervention was successful. Pre-interventional TIMI flow is 3.  Post-intervention TIMI flow is 3. No complications occurred at this lesion. There is a 0% residual stenosis post intervention.   CARDIAC CATHETERIZATION  CARDIAC CATHETERIZATION 09/26/2017  Narrative  Mid RCA lesion is 80% stenosed.  Post Atrio-1 lesion is 80% stenosed.  Post Atrio-2 lesion is 90% stenosed.  Ost Cx to Prox Cx lesion is 80% stenosed.  Prox Cx to Mid Cx lesion is 90% stenosed.  Ost 2nd Mrg lesion is 50% stenosed.  Ost 1st Diag to 1st Diag lesion is 95% stenosed.  Prox LAD lesion is 100% stenosed.  The left ventricular systolic function is normal.  LV end diastolic pressure is normal.  The left ventricular ejection fraction is 55-65% by visual estimate.  1. Severe multivessel CAD with total occlusion of the mid-LAD, severe stenosis of the RCA, and severe stenosis of the left circumflex 2. Normal LV function with LVEF estimated at 60%, normal LVEDP  The patient has Type I DM and severe 3 vessel CAD with normal LV function, surgical coronary anatomy. Will place TCTS consult for CABG. Start heparin after TR band off.  Findings Coronary Findings Diagnostic  Dominance: Right  Left Main The vessel exhibits minimal luminal irregularities.  Left Anterior  Descending Prox LAD lesion is 100% stenosed.  First Diagonal Branch Ost 1st Diag to 1st Diag lesion is 95% stenosed. Diffusely diseased vessel  First Septal Branch Collaterals 1st Sept filled by collaterals from RPDA.  Second Septal Branch Collaterals 2nd Sept filled by collaterals from 1st RPL.  Left Circumflex Ost Cx to Prox Cx lesion is 80% stenosed. Prox Cx to Mid Cx lesion is 90% stenosed.  Second Obtuse Marginal Branch Ost 2nd Mrg lesion is 50% stenosed.  Right Coronary Artery Large, dominant vessel with severe distal vessel disease extending into the PLA branches. The RCA supplies collateral filling of the LAD Mid RCA lesion is 80% stenosed.  Right Posterior Atrioventricular  Artery Post Atrio-1 lesion is 80% stenosed. Post Atrio-2 lesion is 90% stenosed.  Intervention  No interventions have been documented.     ECHOCARDIOGRAM  ECHOCARDIOGRAM COMPLETE 05/06/2019  Narrative ECHOCARDIOGRAM REPORT    Patient Name:   JETLI LAUBSCHER Date of Exam: 05/06/2019 Medical Rec #:  [id number]     Height:       70.0 in Accession #:    [id number]    Weight:       183.4 lb Date of Birth:  [birth year]      BSA:          2.01 m Patient Age:    43 years      BP:           114/64 mmHg Patient Gender: M             HR:           90 bpm. Exam Location:  [facility]  Procedure: 2D Echo  Indications:    Essential hypertension [I10]  History:        Patient has prior history of Echocardiogram examinations, most recent 09/26/2017. CAD; Prior CABG Risk Factors:Hypertension and Dyslipidemia.  Sonographer:    Louie Boston Referring Phys: [phone] ROBERT J KRASOWSKI  IMPRESSIONS   1. Left ventricular ejection fraction, by visual estimation, is 55 to 60%. The left ventricle has normal function. Normal left ventricular size. There is no left ventricular hypertrophy. 2. Global right ventricle looks normal visually (despite the low TASPE).The right ventricular size is normal. No increase in right ventricular wall thickness. 3. Left atrial size was normal. 4. Right atrial size was normal. 5. The mitral valve is normal in structure. Trace mitral valve regurgitation. No evidence of mitral stenosis. 6. The tricuspid valve is normal in structure. Tricuspid valve regurgitation was not visualized by color flow Doppler. 7. The aortic valve is normal in structure. Aortic valve regurgitation was not visualized by color flow Doppler. Structurally normal aortic valve, with no evidence of sclerosis or stenosis. 8. The pulmonic valve was normal in structure. Pulmonic valve regurgitation is not visualized by color flow Doppler. 9. Normal pulmonary artery systolic pressure. 10. The inferior vena cava is  normal in size with greater than 50% respiratory variability, suggesting right atrial pressure of 3 mmHg.  FINDINGS Left Ventricle: Left ventricular ejection fraction, by visual estimation, is 55 to 60%. The left ventricle has normal function. There is no left ventricular hypertrophy. Normal left ventricular size.  Right Ventricle: The right ventricular size is normal. No increase in right ventricular wall thickness. Global RV systolic function is looks normal visually (despite the low TASPE). The tricuspid regurgitant velocity is 2.22 m/s, and with an assumed right atrial pressure of 3 mmHg, the estimated right ventricular systolic pressure is normal at 22.7 mmHg.  Left Atrium:  Left atrial size was normal in size.  Right Atrium: Right atrial size was normal in size  Pericardium: There is no evidence of pericardial effusion.  Mitral Valve: The mitral valve is normal in structure. No evidence of mitral valve stenosis by observation. Trace mitral valve regurgitation.  Tricuspid Valve: The tricuspid valve is normal in structure. Tricuspid valve regurgitation was not visualized by color flow Doppler.  Aortic Valve: The aortic valve is normal in structure. Aortic valve regurgitation was not visualized by color flow Doppler. The aortic valve is structurally normal, with no evidence of sclerosis or stenosis.  Pulmonic Valve: The pulmonic valve was normal in structure. Pulmonic valve regurgitation is not visualized by color flow Doppler.  Aorta: The aortic root, ascending aorta and aortic arch are all structurally normal, with no evidence of dilitation or obstruction.  Venous: The inferior vena cava is normal in size with greater than 50% respiratory variability, suggesting right atrial pressure of 3 mmHg.  IAS/Shunts: No atrial level shunt detected by color flow Doppler. No ventricular septal defect is seen or detected. There is no evidence of an atrial septal defect.    LEFT VENTRICLE           Normals PLAX 2D LVIDd:         4.90 cm  3.6 cm       Diastology                  Normals LVIDs:         3.40 cm  1.7 cm       LV e' lateral:   11.00 cm/s 6.42 cm/s LV PW:         1.00 cm  1.4 cm       LV E/e' lateral: 8.0        15.4 LV IVS:        1.00 cm  1.3 cm       LV e' medial:    7.29 cm/s  6.96 cm/s LVOT diam:     2.10 cm  2.0 cm       LV E/e' medial:  12.0       6.96 LV SV:         65 ml    79 ml LV SV Index:   32.06    45 ml/m2 LVOT Area:     3.46 cm 3.14 cm2  LV Volumes (MOD)             Normals LV area d, A2C:    23.10 cm LV area d, A4C:    26.10 cm LV area s, A2C:    15.20 cm LV area s, A4C:    16.20 cm LV major d, A2C:   7.77 cm LV major d, A4C:   8.27 cm LV major s, A2C:   7.01 cm LV major s, A4C:   6.98 cm LV vol d, MOD A2C: 57.7 ml   68 ml LV vol d, MOD A4C: 68.6 ml LV vol s, MOD A2C: 28.5 ml   24 ml LV vol s, MOD A4C: 32.0 ml LV SV MOD A2C:     29.2 ml LV SV MOD A4C:     68.6 ml LV SV MOD BP:      34.4 ml   45 ml  RIGHT VENTRICLE         IVC TAPSE (M-mode): 1.5 cm  IVC diam: 1.90 cm  LEFT ATRIUM  Index       RIGHT ATRIUM           Index LA diam:      3.40 cm 1.69 cm/m  RA Area:     12.70 cm LA Vol (A2C): 47.4 ml 23.56 ml/m RA Volume:   30.40 ml  15.11 ml/m LA Vol (A4C): 41.0 ml 20.38 ml/m AORTIC VALVE             Normals LVOT Vmax:   118.00 cm/s LVOT Vmean:  77.100 cm/s 75 cm/s LVOT VTI:    0.208 m     25.3 cm  AORTA                 Normals Ao Root diam: 3.10 cm 31 mm Ao Asc diam:  3.40 cm 31 mm  MITRAL VALVE              Normals   TRICUSPID VALVE             Normals MV Area (PHT): 3.48 cm             TR Peak grad:   19.7 mmHg MV PHT:        63.22 msec 55 ms     TR Vmax:        225.00 cm/s 288 cm/s MV Decel Time: 218 msec   187 ms MV E velocity: 87.80 cm/s 103 cm/s  SHUNTS MV A velocity: 59.20 cm/s 70.3 cm/s Systemic VTI:  0.21 m MV E/A ratio:  1.48       1.5       Systemic Diam: 2.10 cm   Kardie Tobb DO Electronically  signed by Thomasene Ripple DO Signature Date/Time: 05/06/2019/2:50:07 PM    Final   TEE  ECHO TEE 10/01/2017  Interpretation Summary  Aortic valve: The valve is trileaflet. No stenosis. No regurgitation.  Right ventricle: Normal cavity size, wall thickness and ejection fraction.  Tricuspid valve: Mild regurgitation. The tricuspid valve regurgitation jet is central.            Risk Assessment/Calculations:             Physical Exam:   VS:  BP 118/80 (BP Location: Left Arm, Patient Position: Sitting, Cuff Size: Normal)   Pulse 66   Ht 5\' 9"  (1.753 m)   Wt 180 lb (81.6 kg)   SpO2 97%   BMI 26.58 kg/m    Wt Readings from Last 3 Encounters:  03/30/23 180 lb (81.6 kg)  03/22/23 172 lb 4.8 oz (78.2 kg)  03/30/20 189 lb 9.6 oz (86 kg)    GEN: Well nourished, well developed in no acute distress NECK: No JVD; No carotid bruits CARDIAC: RRR, no murmurs, rubs, gallops RESPIRATORY:  Clear to auscultation without rales, wheezing or rhonchi  ABDOMEN: Soft, non-tender, non-distended EXTREMITIES:  No edema; No deformity. Right femoral artery site is healing well, palpable  ASSESSMENT AND PLAN: .   Coronary artery disease/nSTEMI-s/p CABG x 5 in 2019, left heart cath on 03/21/2023 as outlined above in the HPI.  Continue DAPT x 12 months with aspirin and Effient. Some atypical type chest pain that can occur at rest or with exertion. Will start Imdur 30 mg daily.  Continue Crestor 10 mg daily. Heart healthy diet and regular cardiovascular exercise encouraged.    Hyperlipidemia-most recent LDL elevated at 86, would prefer to be less than 70, he was started on Crestor during this admission.  Will check FLP's and LFTs in 6 weeks.  Hypertension-blood  pressure is controlled today at 118/80, continue lisinopril 10 mg daily.  Alcohol abuse-complete cessation x 13 days, on gabapentin to help with this and is doing well.   DM 1 -managed by his PCP.       Dispo: Start isosorbide 30 mg daily, return  in 2 months. Will need repeat FLP and LFTs.    Signed, Flossie Dibble, NP

## 2023-03-30 ENCOUNTER — Encounter: Payer: Self-pay | Admitting: Cardiology

## 2023-03-30 ENCOUNTER — Ambulatory Visit: Payer: 59 | Attending: Cardiology | Admitting: Cardiology

## 2023-03-30 VITALS — BP 118/80 | HR 66 | Ht 69.0 in | Wt 180.0 lb

## 2023-03-30 DIAGNOSIS — I214 Non-ST elevation (NSTEMI) myocardial infarction: Secondary | ICD-10-CM

## 2023-03-30 DIAGNOSIS — Z79899 Other long term (current) drug therapy: Secondary | ICD-10-CM | POA: Diagnosis not present

## 2023-03-30 DIAGNOSIS — F1090 Alcohol use, unspecified, uncomplicated: Secondary | ICD-10-CM | POA: Diagnosis not present

## 2023-03-30 DIAGNOSIS — I25709 Atherosclerosis of coronary artery bypass graft(s), unspecified, with unspecified angina pectoris: Secondary | ICD-10-CM

## 2023-03-30 DIAGNOSIS — E785 Hyperlipidemia, unspecified: Secondary | ICD-10-CM | POA: Diagnosis not present

## 2023-03-30 DIAGNOSIS — I10 Essential (primary) hypertension: Secondary | ICD-10-CM | POA: Diagnosis not present

## 2023-03-30 DIAGNOSIS — I152 Hypertension secondary to endocrine disorders: Secondary | ICD-10-CM | POA: Insufficient documentation

## 2023-03-30 MED ORDER — ISOSORBIDE MONONITRATE ER 30 MG PO TB24: 30.0000 mg | ORAL_TABLET | Freq: Every day | ORAL | 3 refills | Status: DC

## 2023-03-30 NOTE — Patient Instructions (Signed)
Medication Instructions:  Your physician has recommended you make the following change in your medication:  Start Isosorbide 30 mg once daily *If you need a refill on your cardiac medications before your next appointment, please call your pharmacy*   Lab Work: Your physician recommends that you return for lab work in: Today for CBC and BMP  If you have labs (blood work) drawn today and your tests are completely normal, you will receive your results only by: MyChart Message (if you have MyChart) OR A paper copy in the mail If you have any lab test that is abnormal or we need to change your treatment, we will call you to review the results.   Testing/Procedures: NONE   Follow-Up: At [facility], you and your health needs are our priority.  As part of our continuing mission to provide you with exceptional heart care, we have created designated Provider Care Teams.  These Care Teams include your primary Cardiologist (physician) and Advanced Practice Providers (APPs -  Physician Assistants and Nurse Practitioners) who all work together to provide you with the care you need, when you need it.  We recommend signing up for the patient portal called "MyChart".  Sign up information is provided on this After Visit Summary.  MyChart is used to connect with patients for Virtual Visits (Telemedicine).  Patients are able to view lab/test results, encounter notes, upcoming appointments, etc.  Non-urgent messages can be sent to your provider as well.   To learn more about what you can do with MyChart, go to [email or web address].    Your next appointment:   2 month(s)  Provider:   Gypsy Balsam, MD    Other Instructions

## 2023-03-31 LAB — COMPREHENSIVE METABOLIC PANEL WITH GFR
ALT: 15 IU/L (ref 0–44)
AST: 24 IU/L (ref 0–40)
Albumin: 4.1 g/dL (ref 4.1–5.1)
Alkaline Phosphatase: 74 IU/L (ref 44–121)
BUN/Creatinine Ratio: 8 — ABNORMAL LOW (ref 9–20)
BUN: 6 mg/dL (ref 6–24)
Bilirubin Total: 0.2 mg/dL (ref 0.0–1.2)
CO2: 25 mmol/L (ref 20–29)
Calcium: 9.6 mg/dL (ref 8.7–10.2)
Chloride: 101 mmol/L (ref 96–106)
Creatinine, Ser: 0.78 mg/dL (ref 0.76–1.27)
Globulin, Total: 2.2 g/dL (ref 1.5–4.5)
Glucose: 101 mg/dL — ABNORMAL HIGH (ref 70–99)
Potassium: 4.5 mmol/L (ref 3.5–5.2)
Sodium: 141 mmol/L (ref 134–144)
Total Protein: 6.3 g/dL (ref 6.0–8.5)
eGFR: 111 mL/min/1.73

## 2023-03-31 LAB — CBC
Hematocrit: 38.7 % (ref 37.5–51.0)
Hemoglobin: 13.2 g/dL (ref 13.0–17.7)
MCH: 31.8 pg (ref 26.6–33.0)
MCHC: 34.1 g/dL (ref 31.5–35.7)
MCV: 93 fL (ref 79–97)
Platelets: 320 x10E3/uL (ref 150–450)
RBC: 4.15 x10E6/uL (ref 4.14–5.80)
RDW: 12.5 % (ref 11.6–15.4)
WBC: 5.5 x10E3/uL (ref 3.4–10.8)

## 2023-04-02 ENCOUNTER — Telehealth: Payer: Self-pay | Admitting: *Deleted

## 2023-04-02 NOTE — Telephone Encounter (Signed)
Left message with normal lab results and left number if pt has any questions. Pt is current on MyChart and so can see them there.

## 2023-04-02 NOTE — Telephone Encounter (Signed)
-----   Message from Flossie Dibble sent at 04/02/2023  7:23 AM EDT ----- Labs look good. Continue with current meds.

## 2023-04-06 ENCOUNTER — Other Ambulatory Visit: Payer: Self-pay

## 2023-04-06 MED ORDER — DEXCOM G6 SENSOR MISC: 3 refills | Status: DC

## 2023-04-06 MED ORDER — DEXCOM G6 TRANSMITTER MISC: 0 refills | Status: DC

## 2023-04-06 MED ORDER — OMNIPOD DASH INTRO (GEN 4) KIT: PACK | 4 refills | Status: DC

## 2023-04-11 ENCOUNTER — Encounter: Payer: Self-pay | Admitting: Internal Medicine

## 2023-04-11 ENCOUNTER — Ambulatory Visit: Payer: 59 | Admitting: Internal Medicine

## 2023-04-11 VITALS — BP 128/80 | HR 86 | Temp 98.6°F | Resp 18 | Ht 69.0 in | Wt 189.8 lb

## 2023-04-11 DIAGNOSIS — I25708 Atherosclerosis of coronary artery bypass graft(s), unspecified, with other forms of angina pectoris: Secondary | ICD-10-CM

## 2023-04-11 DIAGNOSIS — E1169 Type 2 diabetes mellitus with other specified complication: Secondary | ICD-10-CM | POA: Diagnosis not present

## 2023-04-11 DIAGNOSIS — E785 Hyperlipidemia, unspecified: Secondary | ICD-10-CM | POA: Insufficient documentation

## 2023-04-11 DIAGNOSIS — E1059 Type 1 diabetes mellitus with other circulatory complications: Secondary | ICD-10-CM

## 2023-04-11 DIAGNOSIS — E039 Hypothyroidism, unspecified: Secondary | ICD-10-CM | POA: Diagnosis not present

## 2023-04-11 MED ORDER — INSULIN LISPRO (0.5 UNIT DIAL) 100 UNIT/ML (KWIKPEN JR): 5.0000 [IU] | PEN_INJECTOR | Freq: Three times a day (TID) | SUBCUTANEOUS | 5 refills | Status: DC

## 2023-04-11 NOTE — Assessment & Plan Note (Signed)
He will follow with endocrinologist next week.

## 2023-04-11 NOTE — Assessment & Plan Note (Signed)
He was started with rosuvastatin 1 month ago, he will need repeat lipid panel in 3 months.

## 2023-04-11 NOTE — Progress Notes (Signed)
Office Visit  Subjective   Patient ID: Louis Byrd   DOB: 10-18-1975   Age: 47 y.o.   MRN: [id number]   Chief Complaint No chief complaint on file.    History of Present Illness The patient is a 47 year old Caucasian/White male who presents with follow up. He says that he has stent placed 1 month ago. He has CABG 2019. His LDL was 86 and he was started on rosuvastatin 10 mg and so far he does not have side effects.   He again has DKA 1 month ago and he was transported to [facility] and treated there, he also has heart attack during this visit. He has seen eye doctor this year for diabetic eye examination. No diabetic changes in is eyes.   He says he stop drinking alcohol since then and has no alcohol for 1 month. He has follow up appointment with endocrinologist next week.     He also has hypothyroidism and he says that he has the same feeling when his level were high as he was feeling more heart than other people.  In-hospital TSH was not done.  He is on levothyroxine 137 mcg daily.      Past Medical History Past Medical History:  Diagnosis Date   Arthritis    Atypical chest pain 09/25/2017   Bursitis of right hip    Chest pain    a. 1/2/019 Ex MV [facility]): Ex time 8:00 No ecg changes. Large severe apical rev defect and small mod sev apicoseptal rev defect. EF 42%.   Coronary artery disease    Diabetes (HCC) 09/25/2017   Essential hypertension 01/29/2018   Hyperlipidemia    Hypothyroidism    Palpitations    a. premature atrial contractions.   Pulmonary nodule    a. 08/2017 CXR - [facility]: "pulm nodule posterior lung base on lateral film, rec noncontrast chest CT for further eval.   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 Dx age 47;  b. Most recent A1c 6.3.   Unstable angina (HCC)      Allergies Allergies  Allergen Reactions   Morphine  Nausea And Vomiting     Review of Systems Review of Systems  Constitutional: Negative.   HENT: Negative.    Respiratory: Negative.    Cardiovascular: Negative.   Gastrointestinal: Negative.   Neurological: Negative.        Objective:    Vitals BP 128/80   Pulse 86   Temp 98.6 F (37 C)   Resp 18   Ht 5\' 9"  (1.753 m)   Wt 189 lb 12.8 oz (86.1 kg)   SpO2 98%   BMI 28.03 kg/m    Physical Examination Physical Exam Constitutional:      Appearance: Normal appearance.  HENT:     Head: Normocephalic and atraumatic.  Cardiovascular:     Rate and Rhythm: Normal rate and regular rhythm.     Heart sounds: Normal heart sounds.  Pulmonary:     Effort: Pulmonary effort is normal.     Breath sounds: Normal breath sounds.  Abdominal:     General: Bowel sounds are normal.     Palpations: Abdomen is soft.  Neurological:     General: No focal deficit present.     Mental Status: He is alert and oriented to person,  place, and time.        Assessment & Plan:   Type 1 diabetes (HCC) He will follow with endocrinologist next week.  Hypothyroidism He take 137 mcg PO daily  Coronary artery disease He has stent placed again. He will follow with cardiologist  Dyslipidemia He was started with rosuvastatin 1 month ago, he will need repeat lipid panel in 3 months.     Return in about 3 months (around 07/12/2023).   Eloisa Northern, MD

## 2023-04-11 NOTE — Assessment & Plan Note (Signed)
He has stent placed again. He will follow with cardiologist

## 2023-04-11 NOTE — Assessment & Plan Note (Signed)
He take 137 mcg PO daily

## 2023-04-18 ENCOUNTER — Other Ambulatory Visit: Payer: Self-pay

## 2023-04-19 ENCOUNTER — Other Ambulatory Visit: Payer: Self-pay

## 2023-04-22 ENCOUNTER — Other Ambulatory Visit: Payer: Self-pay | Admitting: Internal Medicine

## 2023-04-23 ENCOUNTER — Other Ambulatory Visit: Payer: Self-pay

## 2023-04-23 MED ORDER — ROSUVASTATIN CALCIUM 10 MG PO TABS: 10.0000 mg | ORAL_TABLET | Freq: Every day | ORAL | 0 refills | Status: DC

## 2023-04-23 MED ORDER — OMNIPOD 5 G7 INTRO (GEN 5) KIT: 1.3000 [IU] | PACK | Freq: Once | 0 refills | Status: DC

## 2023-04-23 MED ORDER — OMNIPOD 5 G7 PODS (GEN 5) MISC: 1.3000 [IU] | 6 refills | Status: DC | PRN

## 2023-04-23 MED ORDER — LEVOTHYROXINE SODIUM 150 MCG PO TABS: 150.0000 ug | ORAL_TABLET | Freq: Every day | ORAL | 2 refills | Status: DC

## 2023-04-23 MED ORDER — LISINOPRIL 10 MG PO TABS: 10.0000 mg | ORAL_TABLET | Freq: Every day | ORAL | 1 refills | Status: DC

## 2023-04-23 MED ORDER — GABAPENTIN 300 MG PO CAPS: 300.0000 mg | ORAL_CAPSULE | Freq: Every day | ORAL | 0 refills | Status: DC

## 2023-04-24 ENCOUNTER — Other Ambulatory Visit: Payer: Self-pay

## 2023-04-24 ENCOUNTER — Other Ambulatory Visit (HOSPITAL_COMMUNITY): Payer: Self-pay

## 2023-04-24 MED ORDER — OMNIPOD 5 DEXG7G6 PODS GEN 5 MISC: 6 refills | Status: DC

## 2023-04-26 ENCOUNTER — Other Ambulatory Visit (HOSPITAL_COMMUNITY): Payer: Self-pay

## 2023-04-26 ENCOUNTER — Other Ambulatory Visit: Payer: Self-pay

## 2023-04-26 MED ORDER — OMNIPOD 5 DEXG7G6 PODS GEN 5 MISC: 6 refills | Status: DC

## 2023-05-15 ENCOUNTER — Other Ambulatory Visit: Payer: Self-pay | Admitting: Internal Medicine

## 2023-05-15 DIAGNOSIS — E109 Type 1 diabetes mellitus without complications: Secondary | ICD-10-CM | POA: Diagnosis not present

## 2023-05-15 DIAGNOSIS — Z833 Family history of diabetes mellitus: Secondary | ICD-10-CM | POA: Diagnosis not present

## 2023-05-15 DIAGNOSIS — I252 Old myocardial infarction: Secondary | ICD-10-CM | POA: Diagnosis not present

## 2023-05-15 DIAGNOSIS — D696 Thrombocytopenia, unspecified: Secondary | ICD-10-CM | POA: Diagnosis not present

## 2023-05-15 DIAGNOSIS — F1021 Alcohol dependence, in remission: Secondary | ICD-10-CM | POA: Diagnosis not present

## 2023-05-15 DIAGNOSIS — F419 Anxiety disorder, unspecified: Secondary | ICD-10-CM | POA: Diagnosis not present

## 2023-05-15 DIAGNOSIS — Z8249 Family history of ischemic heart disease and other diseases of the circulatory system: Secondary | ICD-10-CM | POA: Diagnosis not present

## 2023-05-15 DIAGNOSIS — Z7902 Long term (current) use of antithrombotics/antiplatelets: Secondary | ICD-10-CM | POA: Diagnosis not present

## 2023-05-15 DIAGNOSIS — F325 Major depressive disorder, single episode, in full remission: Secondary | ICD-10-CM | POA: Diagnosis not present

## 2023-05-15 DIAGNOSIS — I2510 Atherosclerotic heart disease of native coronary artery without angina pectoris: Secondary | ICD-10-CM | POA: Diagnosis not present

## 2023-05-15 DIAGNOSIS — E785 Hyperlipidemia, unspecified: Secondary | ICD-10-CM | POA: Diagnosis not present

## 2023-05-15 DIAGNOSIS — I10 Essential (primary) hypertension: Secondary | ICD-10-CM | POA: Diagnosis not present

## 2023-05-16 ENCOUNTER — Other Ambulatory Visit: Payer: Self-pay

## 2023-05-17 ENCOUNTER — Other Ambulatory Visit (HOSPITAL_COMMUNITY): Payer: Self-pay

## 2023-05-17 ENCOUNTER — Other Ambulatory Visit: Payer: Self-pay

## 2023-06-05 ENCOUNTER — Ambulatory Visit: Payer: 59 | Admitting: Cardiology

## 2023-06-29 ENCOUNTER — Encounter: Payer: Self-pay | Admitting: Internal Medicine

## 2023-06-29 ENCOUNTER — Ambulatory Visit: Payer: 59 | Admitting: Internal Medicine

## 2023-06-29 VITALS — BP 126/84 | HR 70 | Temp 98.3°F | Resp 18 | Ht 69.0 in | Wt 189.1 lb

## 2023-06-29 DIAGNOSIS — Z0000 Encounter for general adult medical examination without abnormal findings: Secondary | ICD-10-CM | POA: Diagnosis not present

## 2023-06-29 DIAGNOSIS — E785 Hyperlipidemia, unspecified: Secondary | ICD-10-CM

## 2023-06-29 DIAGNOSIS — E1169 Type 2 diabetes mellitus with other specified complication: Secondary | ICD-10-CM | POA: Diagnosis not present

## 2023-06-29 DIAGNOSIS — F32A Depression, unspecified: Secondary | ICD-10-CM | POA: Insufficient documentation

## 2023-06-29 DIAGNOSIS — E1059 Type 1 diabetes mellitus with other circulatory complications: Secondary | ICD-10-CM

## 2023-06-29 DIAGNOSIS — F3341 Major depressive disorder, recurrent, in partial remission: Secondary | ICD-10-CM | POA: Diagnosis not present

## 2023-06-29 DIAGNOSIS — E039 Hypothyroidism, unspecified: Secondary | ICD-10-CM

## 2023-06-29 DIAGNOSIS — E1042 Type 1 diabetes mellitus with diabetic polyneuropathy: Secondary | ICD-10-CM | POA: Insufficient documentation

## 2023-06-29 DIAGNOSIS — Z6827 Body mass index (BMI) 27.0-27.9, adult: Secondary | ICD-10-CM | POA: Diagnosis not present

## 2023-06-29 DIAGNOSIS — I25708 Atherosclerosis of coronary artery bypass graft(s), unspecified, with other forms of angina pectoris: Secondary | ICD-10-CM

## 2023-06-29 DIAGNOSIS — Z951 Presence of aortocoronary bypass graft: Secondary | ICD-10-CM | POA: Diagnosis not present

## 2023-06-29 DIAGNOSIS — M1990 Unspecified osteoarthritis, unspecified site: Secondary | ICD-10-CM

## 2023-06-29 MED ORDER — TETANUS-DIPHTHERIA TOXOIDS TD 2-2 LF/0.5ML IM SUSP: 0.5000 mL | Freq: Once | INTRAMUSCULAR | 0 refills | Status: AC

## 2023-06-29 MED ORDER — INSULIN LISPRO (0.5 UNIT DIAL) 100 UNIT/ML (KWIKPEN JR): 5.0000 [IU] | PEN_INJECTOR | Freq: Three times a day (TID) | SUBCUTANEOUS | 5 refills | Status: DC

## 2023-06-29 NOTE — Assessment & Plan Note (Signed)
He takes sertraline 50 mg daily, he does not want to see psychiatrist. He does not have suicidal and homicidal thoughts.

## 2023-06-29 NOTE — Progress Notes (Signed)
Office Visit  Subjective   Patient ID: Louis Byrd   DOB: 17-Sep-1975   Age: 47 y.o.   MRN: [id number]   Chief Complaint Chief Complaint  Patient presents with   Annual Exam    Physical     History of Present Illness 47 years old male who is here for annual physical examination. He has type I diabtes for 37 years now, he takes NPH 26 units every morning. He also takes takes mealtime coverahe with Insulin lispro. He has CGM and is HbA1c was 7.9% in August 24. He needs proteinuria today. He need eye doctor for diabetic eye examination. He will go to [facility] for eye examination. He still has numbness and tingling in his legs, he also has knee pain even when sitting with strange feeling in his knees.   He has CABG was 2019 and then stent was placed August 24 at [facility]. He follows with cardiologist. .  He takes rosuvastatin 10 mg daily. His LDL was 86 in August 24. He is married, he quit drinking 4 months ago and this has made a difference in him. He does not smoke but use tobacco by dipping and use mariguana.   His brother has type one diabetes. His grand mother has ovarian cancer and uncle has colon cancer.    Past Medical History Past Medical History:  Diagnosis Date   Arthritis    Atypical chest pain 09/25/2017   Bursitis of right hip    Chest pain    a. 1/2/019 Ex [facility]): Ex time 8:00 No ecg changes. Large severe apical rev defect and small mod sev apicoseptal rev defect. EF 42%.   Coronary artery disease    Diabetes (HCC) 09/25/2017   Essential hypertension 01/29/2018   Hyperlipidemia    Hypothyroidism    Palpitations    a. premature atrial contractions.   Pulmonary nodule    a. 08/2017 CXR - [facility]: "pulm nodule posterior lung base on lateral film, rec noncontrast chest CT for further eval.   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 Dx age 43;  b. Most recent A1c 6.3.   Unstable angina (HCC)      Allergies Allergies  Allergen Reactions   Morphine Nausea And Vomiting     Review of Systems Review of Systems  Constitutional: Negative.   HENT: Negative.    Respiratory: Negative.    Cardiovascular: Negative.   Gastrointestinal: Negative.   Neurological:  Positive for tingling.  Psychiatric/Behavioral:  Positive for depression.        Objective:    Vitals BP 126/84 (BP Location: Left Arm, Patient Position: Sitting, Cuff Size: Normal)   Pulse 70   Temp 98.3 F (36.8 C)   Resp 18   Ht 5\' 9"  (1.753 m)   Wt 189 lb 2 oz (85.8 kg)   SpO2 98%   BMI 27.93 kg/m    Physical Examination Physical Exam Constitutional:      Appearance: Normal appearance.  HENT:     Head: Normocephalic and atraumatic.  Cardiovascular:     Rate and Rhythm: Normal rate and regular rhythm.     Heart sounds: Normal heart sounds.  Pulmonary:     Effort: Pulmonary effort is normal.     Breath sounds: Normal breath sounds.  Abdominal:  General: Bowel sounds are normal.     Palpations: Abdomen is soft.  Neurological:     General: No focal deficit present.     Mental Status: He is alert and oriented to person, place, and time.        Assessment & Plan:   Depression He takes sertraline 50 mg daily, he does not want to see psychiatrist. He does not have suicidal and homicidal thoughts.   Diabetic polyneuropathy associated with type 1 diabetes mellitus (HCC) His sugar been acceptable, he will take gabapentin for neuropathy  Hyperlipidemia associated with type 2 diabetes mellitus (HCC) Will do lipid panel today.    Return in about 3 months (around 09/29/2023).   Eloisa Northern, MD

## 2023-06-29 NOTE — Assessment & Plan Note (Signed)
His sugar been acceptable, he will take gabapentin for neuropathy

## 2023-06-29 NOTE — Assessment & Plan Note (Signed)
Will do lipid panel today. 

## 2023-06-30 LAB — CMP14 + ANION GAP
ALT: 13 [IU]/L (ref 0–44)
AST: 20 [IU]/L (ref 0–40)
Albumin: 4.6 g/dL (ref 4.1–5.1)
Alkaline Phosphatase: 102 [IU]/L (ref 44–121)
Anion Gap: 16 mmol/L (ref 10.0–18.0)
BUN/Creatinine Ratio: 10 (ref 9–20)
BUN: 7 mg/dL (ref 6–24)
Bilirubin Total: 0.2 mg/dL (ref 0.0–1.2)
CO2: 29 mmol/L (ref 20–29)
Calcium: 9.9 mg/dL (ref 8.7–10.2)
Chloride: 97 mmol/L (ref 96–106)
Creatinine, Ser: 0.72 mg/dL — ABNORMAL LOW (ref 0.76–1.27)
Globulin, Total: 2.5 g/dL (ref 1.5–4.5)
Glucose: 82 mg/dL (ref 70–99)
Potassium: 4 mmol/L (ref 3.5–5.2)
Sodium: 142 mmol/L (ref 134–144)
Total Protein: 7.1 g/dL (ref 6.0–8.5)
eGFR: 114 mL/min/{1.73_m2} (ref >59)

## 2023-06-30 LAB — LIPID PANEL
Chol/HDL Ratio: 3.3 ratio (ref 0.0–5.0)
Cholesterol, Total: 203 mg/dL — ABNORMAL HIGH (ref 100–199)
HDL: 62 mg/dL (ref >39)
LDL Chol Calc (NIH): 117 mg/dL — ABNORMAL HIGH (ref 0–99)
Triglycerides: 135 mg/dL (ref 0–149)
VLDL Cholesterol Cal: 24 mg/dL (ref 5–40)

## 2023-06-30 LAB — MICROALBUMIN / CREATININE URINE RATIO
Creatinine, Urine: 25.7 mg/dL
Microalb/Creat Ratio: <12 mg/g{creat} (ref 0–29)
Microalbumin, Urine: <3 ug/mL

## 2023-06-30 LAB — TSH: TSH: 5.19 u[IU]/mL — ABNORMAL HIGH (ref 0.450–4.500)

## 2023-07-02 ENCOUNTER — Telehealth: Payer: Self-pay | Admitting: Internal Medicine

## 2023-07-02 ENCOUNTER — Other Ambulatory Visit: Payer: Self-pay | Admitting: Internal Medicine

## 2023-07-02 NOTE — Telephone Encounter (Signed)
I have called and left the message to call back

## 2023-07-11 ENCOUNTER — Other Ambulatory Visit: Payer: Self-pay | Admitting: Internal Medicine

## 2023-07-31 ENCOUNTER — Ambulatory Visit: Payer: 59 | Admitting: Student

## 2023-07-31 VITALS — BP 130/74 | Temp 98.6°F | Resp 18 | Ht 69.0 in | Wt 188.4 lb

## 2023-07-31 DIAGNOSIS — H9203 Otalgia, bilateral: Secondary | ICD-10-CM | POA: Insufficient documentation

## 2023-07-31 DIAGNOSIS — M25512 Pain in left shoulder: Secondary | ICD-10-CM | POA: Diagnosis not present

## 2023-07-31 DIAGNOSIS — R42 Dizziness and giddiness: Secondary | ICD-10-CM | POA: Diagnosis not present

## 2023-07-31 DIAGNOSIS — R52 Pain, unspecified: Secondary | ICD-10-CM | POA: Insufficient documentation

## 2023-07-31 LAB — POCT URINALYSIS DIPSTICK
Bilirubin, UA: NEGATIVE
Blood, UA: NEGATIVE
Glucose, UA: NEGATIVE
Ketones, UA: NEGATIVE
Leukocytes, UA: NEGATIVE
Nitrite, UA: NEGATIVE
Protein, UA: NEGATIVE
Spec Grav, UA: 1.01 (ref 1.010–1.025)
Urobilinogen, UA: 0.2 U/dL
pH, UA: 7 (ref 5.0–8.0)

## 2023-07-31 LAB — POC COVID19 BINAXNOW: SARS Coronavirus 2 Ag: NEGATIVE

## 2023-07-31 LAB — POC INFLUENZA A&B (BINAX/QUICKVUE)
Influenza A, POC: NEGATIVE
Influenza B, POC: NEGATIVE

## 2023-07-31 MED ORDER — AZELASTINE HCL 0.1 % NA SOLN: 2.0000 | Freq: Two times a day (BID) | NASAL | 0 refills | Status: DC

## 2023-07-31 NOTE — Assessment & Plan Note (Addendum)
Neuro exam is unremarkable, orthostatics are negative, urine dipstick normal. CMP pending.  He should properly hydrate and monitor his blood sugars to avoid hypoglycemic events.  Erythema noted to canal wall without inflammation or swelling. He will use OTC allergy relief and azelastine 0.1% nasal spray for relief.

## 2023-07-31 NOTE — Assessment & Plan Note (Addendum)
The patient is negative for flu and covid. This does not appear to be viral.

## 2023-07-31 NOTE — Assessment & Plan Note (Signed)
Crepitus and decreased ROM in the left shoulder. He can use tylenol arthritis, samples provided. A referral to [facility] has been placed.

## 2023-07-31 NOTE — Progress Notes (Signed)
Acute Office Visit  Subjective:     Patient ID: TJ VEGTER, male    DOB: May 03, 1976, 47 y.o.   MRN: [id number]  Chief Complaint  Patient presents with   Generalized Body Aches    Slight dizziness, ears feel stopped up, no cough, no sinus congestion. Body aches all over, no energy, ongoing x  2 weeks. Not taking any otc meds., no fever, no nasal discharge, no sore throat.    HPI  Patient is in today for complaints of soreness in all joints (back, shoulder, knees) that has been going on for a month and ear discomfort. For the last 2 weeks he reports bilateral ears feeling stopped up and popping. He has a history of ear infections, his last one was 3 years ago. He notes when standing still or looking down makes him feel "weird feeling." The patient also notes feeling confused over the last 6 months, he reports having to sit and think for a moment, difficulty getting words out. He denies syncope, congestion, fevers, cough, headaches, or falls. There has been a couple days of queasy feeling without vomiting, he has diarrhea periodically.  He is a type 1 diabetic, notes his glucose to be up and down but for the most part stay around the 100s.  After a meal his glucose reading was 103 today. His last hypoglycemic event was yesterday where his sugar was 45. He used Dr. Reino Kent and a peanut butter sandwich to being it up.    He has joint soreness in lower back, left shoulder and bilateral knees. History of bursitis in the left hip. He worked as a Actor for many years, now he is unable to lift his left elbow past ear level without pain. He also notes playing the guitar has become painful. He denies any recent trauma or injuries. He is not taking any OTC medication for the pain.   Review of Systems  Constitutional: Negative.   HENT:  Negative for congestion, ear discharge, ear pain, hearing loss and sinus pain.   Eyes: Negative.   Respiratory: Negative.    Cardiovascular: Negative.    Gastrointestinal:  Positive for vomiting.  Genitourinary: Negative.   Skin: Negative.   Neurological:  Positive for dizziness. Negative for loss of consciousness, weakness and headaches.  Psychiatric/Behavioral: Negative.          Objective:    BP 130/74 (BP Location: Left Arm, Patient Position: Standing)   Temp 98.6 F (37 C) (Temporal)   Resp 18   Ht 5\' 9"  (1.753 m)   Wt 188 lb 6 oz (85.4 kg)   SpO2 95%   BMI 27.82 kg/m    Physical Exam Vitals reviewed.  Constitutional:      Appearance: Normal appearance.  HENT:     Head: Normocephalic and atraumatic.     Right Ear: Hearing, tympanic membrane and external ear normal. No middle ear effusion. No mastoid tenderness.     Left Ear: Hearing, tympanic membrane and external ear normal.  No middle ear effusion. No mastoid tenderness.     Ears:     Comments: Erythema of bilateral canal     Nose: Nose normal.     Mouth/Throat:     Mouth: Mucous membranes are moist.     Pharynx: Oropharynx is clear. No oropharyngeal exudate or posterior oropharyngeal erythema.  Eyes:     Extraocular Movements: Extraocular movements intact.     Conjunctiva/sclera: Conjunctivae normal.     Pupils: Pupils are equal, round, and  reactive to light.  Cardiovascular:     Rate and Rhythm: Normal rate and regular rhythm.     Pulses: Normal pulses.     Heart sounds: Normal heart sounds. No murmur heard. Pulmonary:     Effort: Pulmonary effort is normal.     Breath sounds: Normal breath sounds.  Abdominal:     General: Bowel sounds are normal. There is no distension.     Palpations: Abdomen is soft.  Musculoskeletal:     Left shoulder: Tenderness and crepitus present. No swelling. Decreased range of motion. Normal pulse.     Cervical back: Normal range of motion. Tenderness present.  Skin:    General: Skin is warm and dry.     Capillary Refill: Capillary refill takes less than 2 seconds.  Neurological:     General: No focal deficit present.      Mental Status: He is alert and oriented to person, place, and time.     Cranial Nerves: Cranial nerves 2-12 are intact. No cranial nerve deficit.     Sensory: No sensory deficit.     Motor: Motor function is intact. No weakness, tremor, abnormal muscle tone or pronator drift.     Coordination: Coordination is intact. Coordination normal. Finger-Nose-Finger Test and Heel to [facility] Test normal.     Gait: Gait normal.     Deep Tendon Reflexes: Reflexes normal.  Psychiatric:        Mood and Affect: Mood normal.        Behavior: Behavior normal.     Results for orders placed or performed in visit on 07/31/23  POC Influenza A&B(BINAX/QUICKVUE)  Result Value Ref Range   Influenza A, POC Negative Negative   Influenza B, POC Negative Negative  POC COVID-19 BinaxNow  Result Value Ref Range   SARS Coronavirus 2 Ag Negative Negative  POCT urinalysis dipstick  Result Value Ref Range   Color, UA Yellow    Clarity, UA Clear    Glucose, UA Negative Negative   Bilirubin, UA Negative    Ketones, UA Negative    Spec Grav, UA 1.010 1.010 - 1.025   Blood, UA Negative    pH, UA 7.0 5.0 - 8.0   Protein, UA Negative Negative   Urobilinogen, UA 0.2 0.2 or 1.0 E.U./dL   Nitrite, UA Negative    Leukocytes, UA Negative Negative   Appearance     Odor          Assessment & Plan:   Problem List Items Addressed This Visit     Dizziness   Neuro exam is unremarkable, orthostatics are negative. CMP pending.  He should properly hydrate and monitor his blood sugars to avoid hypoglycemic events.  Erythema noted to canal wall without inflammation or swelling. He will use OTC allergy relief and as      Relevant Orders   CMP14 + Anion Gap   Body aches - Primary   The patient is negative for flu and covid. This does not appear to be viral.       Relevant Orders   POC Influenza A&B(BINAX/QUICKVUE) (Completed)   POC COVID-19 BinaxNow (Completed)   Otalgia of both ears   Relevant Orders   POCT  urinalysis dipstick (Completed)   Acute pain of left shoulder    Meds ordered this encounter  Medications   azelastine (ASTELIN) 0.1 % nasal spray    Sig: Place 2 sprays into both nostrils 2 (two) times daily. Use in each nostril as directed  Dispense:  30 mL    Refill:  0    Return if symptoms worsen or fail to improve.  Edwena Blow, NP

## 2023-07-31 NOTE — Patient Instructions (Addendum)
Use nasal spray twice a day for at least 7 days to help with popping sensation. Use allergy medication Zyrtec or Claritin.   We will call you with lab results.

## 2023-08-01 LAB — CMP14 + ANION GAP
ALT: 14 [IU]/L (ref 0–44)
AST: 24 [IU]/L (ref 0–40)
Albumin: 4.4 g/dL (ref 4.1–5.1)
Alkaline Phosphatase: 103 [IU]/L (ref 44–121)
Anion Gap: 13 mmol/L (ref 10.0–18.0)
BUN/Creatinine Ratio: 8 — ABNORMAL LOW (ref 9–20)
BUN: 5 mg/dL — ABNORMAL LOW (ref 6–24)
Bilirubin Total: 0.4 mg/dL (ref 0.0–1.2)
CO2: 28 mmol/L (ref 20–29)
Calcium: 9.5 mg/dL (ref 8.7–10.2)
Chloride: 99 mmol/L (ref 96–106)
Creatinine, Ser: 0.61 mg/dL — ABNORMAL LOW (ref 0.76–1.27)
Globulin, Total: 2.3 g/dL (ref 1.5–4.5)
Glucose: 124 mg/dL — ABNORMAL HIGH (ref 70–99)
Potassium: 3.9 mmol/L (ref 3.5–5.2)
Sodium: 140 mmol/L (ref 134–144)
Total Protein: 6.7 g/dL (ref 6.0–8.5)
eGFR: 120 mL/min/{1.73_m2} (ref >59)

## 2023-08-01 NOTE — Progress Notes (Signed)
I have reviewed your lab results. It appears that you are mildly dehydrated, this could contribute  Please increase your fluid intake of beverages with electrolytes (sugar free Gatorade, Pedialyte, water).

## 2023-09-09 ENCOUNTER — Other Ambulatory Visit: Payer: Self-pay | Admitting: Student

## 2023-09-13 DIAGNOSIS — I252 Old myocardial infarction: Secondary | ICD-10-CM | POA: Diagnosis not present

## 2023-09-13 DIAGNOSIS — E039 Hypothyroidism, unspecified: Secondary | ICD-10-CM | POA: Diagnosis not present

## 2023-09-13 DIAGNOSIS — E1051 Type 1 diabetes mellitus with diabetic peripheral angiopathy without gangrene: Secondary | ICD-10-CM | POA: Diagnosis not present

## 2023-09-13 DIAGNOSIS — F419 Anxiety disorder, unspecified: Secondary | ICD-10-CM | POA: Diagnosis not present

## 2023-09-13 DIAGNOSIS — Z833 Family history of diabetes mellitus: Secondary | ICD-10-CM | POA: Diagnosis not present

## 2023-09-13 DIAGNOSIS — M1990 Unspecified osteoarthritis, unspecified site: Secondary | ICD-10-CM | POA: Diagnosis not present

## 2023-09-13 DIAGNOSIS — Z809 Family history of malignant neoplasm, unspecified: Secondary | ICD-10-CM | POA: Diagnosis not present

## 2023-09-13 DIAGNOSIS — I10 Essential (primary) hypertension: Secondary | ICD-10-CM | POA: Diagnosis not present

## 2023-09-13 DIAGNOSIS — Z87891 Personal history of nicotine dependence: Secondary | ICD-10-CM | POA: Diagnosis not present

## 2023-09-13 DIAGNOSIS — E785 Hyperlipidemia, unspecified: Secondary | ICD-10-CM | POA: Diagnosis not present

## 2023-09-13 DIAGNOSIS — Z8249 Family history of ischemic heart disease and other diseases of the circulatory system: Secondary | ICD-10-CM | POA: Diagnosis not present

## 2023-09-13 DIAGNOSIS — Z7902 Long term (current) use of antithrombotics/antiplatelets: Secondary | ICD-10-CM | POA: Diagnosis not present

## 2023-09-28 ENCOUNTER — Ambulatory Visit: Payer: 59 | Admitting: Internal Medicine

## 2023-09-28 ENCOUNTER — Encounter: Payer: Self-pay | Admitting: Internal Medicine

## 2023-09-28 VITALS — BP 124/88 | HR 83 | Temp 98.0°F | Resp 18 | Wt 184.2 lb

## 2023-09-28 DIAGNOSIS — R52 Pain, unspecified: Secondary | ICD-10-CM | POA: Diagnosis not present

## 2023-09-28 DIAGNOSIS — E785 Hyperlipidemia, unspecified: Secondary | ICD-10-CM | POA: Diagnosis not present

## 2023-09-28 DIAGNOSIS — J329 Chronic sinusitis, unspecified: Secondary | ICD-10-CM | POA: Diagnosis not present

## 2023-09-28 MED ORDER — OXYMETAZOLINE HCL 0.05 % NA SOLN: 1.0000 | Freq: Two times a day (BID) | NASAL | 0 refills | Status: DC

## 2023-09-28 MED ORDER — FEXOFENADINE HCL 180 MG PO TABS: 180.0000 mg | ORAL_TABLET | Freq: Every day | ORAL | 0 refills | Status: DC

## 2023-09-28 NOTE — Assessment & Plan Note (Signed)
He will hold rosuvastatin for 2 weeks and see  if that make a difference. If not will resume rosuvastatin

## 2023-09-28 NOTE — Progress Notes (Signed)
   Acute Office Visit  Subjective:     Patient ID: Louis Byrd, male    DOB: [birth year], 48 y.o.   MRN: [id number]  Chief Complaint  Patient presents with   congestion, body aches    SHOB    HPI Patient is in today for Stuffy nose, running nose from left nose and not much coming from right nose for 3 days. He has dry cough and fascial pressure. He is taking over the counter medicine but is not helping. He denies any fever or chills. No asthma.   Review of Systems  Constitutional: Negative.   HENT:  Positive for congestion and sinus pain.   Respiratory: Negative.    Cardiovascular: Negative.         Objective:    BP 124/88 (BP Location: Left Arm, Patient Position: Sitting, Cuff Size: Normal)   Pulse 83   Temp 98 F (36.7 C)   Resp 18   Wt 184 lb 4 oz (83.6 kg)   SpO2 97%   BMI 27.21 kg/m    Physical Exam Constitutional:      Appearance: Normal appearance.  HENT:     Head: Normocephalic and atraumatic.  Cardiovascular:     Rate and Rhythm: Normal rate and regular rhythm.     Heart sounds: Normal heart sounds.  Pulmonary:     Effort: Pulmonary effort is normal.     Breath sounds: Normal breath sounds.  Neurological:     Mental Status: He is alert.     No results found for any visits on 09/28/23.      Assessment & Plan:   Problem List Items Addressed This Visit       Respiratory   Rhinosinusitis - Primary   He will take allegra and oxymetalozone twice a day and drink plenty of water. If not better then he will call.      Relevant Medications   oxymetazoline (AFRIN) 0.05 % nasal spray   fexofenadine (ALLEGRA) 180 MG tablet     Other   Dyslipidemia   Body aches   He will hold rosuvastatin for 2 weeks and see  if that make a difference. If not will resume rosuvastatin       No orders of the defined types were placed in this encounter.   Return in about 2 weeks (around 10/12/2023).  Eloisa Northern, MD

## 2023-10-12 ENCOUNTER — Encounter: Payer: Self-pay | Admitting: Internal Medicine

## 2023-10-12 ENCOUNTER — Ambulatory Visit: Payer: 59 | Admitting: Internal Medicine

## 2023-10-12 VITALS — BP 124/80 | HR 98 | Temp 98.7°F | Resp 20 | Ht 69.0 in | Wt 188.2 lb

## 2023-10-12 DIAGNOSIS — E039 Hypothyroidism, unspecified: Secondary | ICD-10-CM | POA: Diagnosis not present

## 2023-10-12 DIAGNOSIS — E785 Hyperlipidemia, unspecified: Secondary | ICD-10-CM

## 2023-10-12 DIAGNOSIS — E1042 Type 1 diabetes mellitus with diabetic polyneuropathy: Secondary | ICD-10-CM

## 2023-10-12 MED ORDER — PREGABALIN 75 MG PO CAPS: 75.0000 mg | ORAL_CAPSULE | Freq: Two times a day (BID) | ORAL | 2 refills | Status: DC

## 2023-10-12 NOTE — Assessment & Plan Note (Signed)
 I will stop and start pregabalin 75 mg twice a day to see if that helps, I will titratae the dose.

## 2023-10-12 NOTE — Assessment & Plan Note (Signed)
 His TSH was good and his symptoms are controlled on levothyroxine 150 mcg daily

## 2023-10-12 NOTE — Assessment & Plan Note (Signed)
 He will restart his rosuvastatin and will do lipid panel on next visit.

## 2023-10-12 NOTE — Progress Notes (Signed)
 Office Visit  Subjective   Patient ID: Louis Byrd   DOB: 1976-06-18   Age: 48 y.o.   MRN: [id number]   Chief Complaint Chief Complaint  Patient presents with   Follow-up    2 week     History of Present Illness Patient is a 48 years old male with type I diabtes for 37 years now, he takes NPH 26 units every morning. He also takes takes mealtime coverahe with Insulin lispro. He has CGM and is HbA1c was 7.9% in August 24. He has appointment with endocrinologist in March 2025. He need to see eye doctor. He still has numbness and tingling in his legs, he also has knee pain even when sitting with strange feeling in his knees.   He says that he went to urgent care and he was told that he has sciatica, pain is better with naproxen and flexeril.   He has CABG was 2019 and then stent was placed August 24 at [facility]. He follows with cardiologist.    He has dyslipidemia and takes  He takes rosuvastatin 10 mg daily. His LDL was 117 in 06/2023. He restart taking rosuvatstain  He stopped taking rosuvastatin to see if her muscle and body pain get better or not. He still has muscle and joint pain.     Past Medical History Past Medical History:  Diagnosis Date   Arthritis    Atypical chest pain 09/25/2017   Bursitis of right hip    Chest pain    a. 1/2/019 Ex [facility]): Ex time 8:00 No ecg changes. Large severe apical rev defect and small mod sev apicoseptal rev defect. EF 42%.   Coronary artery disease    Diabetes (HCC) 09/25/2017   Essential hypertension 01/29/2018   Hyperlipidemia    Hypothyroidism    Palpitations    a. premature atrial contractions.   Pulmonary nodule    a. 08/2017 CXR - [facility]: "pulm nodule posterior lung base on lateral film, rec noncontrast chest CT for further eval.   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 Dx age 31;  b. Most  recent A1c 6.3.   Unstable angina (HCC)      Allergies Allergies  Allergen Reactions   Morphine Nausea And Vomiting     Review of Systems Review of Systems  Constitutional:  Positive for malaise/fatigue.  HENT: Negative.    Respiratory: Negative.    Cardiovascular: Negative.   Gastrointestinal: Negative.   Neurological: Negative.        Objective:    Vitals BP 124/80 (BP Location: Left Arm, Patient Position: Sitting, Cuff Size: Normal)   Pulse 98   Temp 98.7 F (37.1 C)   Resp 20   Ht 5\' 9"  (1.753 m)   Wt 188 lb 4 oz (85.4 kg)   SpO2 99%   BMI 27.80 kg/m    Physical Examination Physical Exam Constitutional:      Appearance: Normal appearance.  HENT:     Head: Normocephalic and atraumatic.  Cardiovascular:     Rate and Rhythm: Normal rate and regular rhythm.     Heart sounds: Normal heart sounds.  Pulmonary:     Effort: Pulmonary effort is normal.     Breath sounds: Normal breath sounds.  Abdominal:     General: Bowel sounds are normal.  Palpations: Abdomen is soft.  Neurological:     General: No focal deficit present.     Mental Status: He is alert and oriented to person, place, and time.        Assessment & Plan:   Diabetic polyneuropathy associated with type 1 diabetes mellitus (HCC) I will stop and start pregabalin 75 mg twice a day to see if that helps, I will titratae the dose.  Hypothyroidism His TSH was good and his symptoms are controlled on levothyroxine 150 mcg daily  Dyslipidemia He will restart his rosuvastatin and will do lipid panel on next visit.    Return in about 3 months (around 01/09/2024).   Eloisa Northern, MD

## 2023-10-16 NOTE — Assessment & Plan Note (Signed)
 He will take allegra and oxymetalozone twice a day and drink plenty of water. If not better then he will call.

## 2023-11-24 ENCOUNTER — Other Ambulatory Visit: Payer: Self-pay | Admitting: Internal Medicine

## 2024-01-09 ENCOUNTER — Ambulatory Visit: Payer: 59 | Admitting: Internal Medicine

## 2024-01-30 ENCOUNTER — Other Ambulatory Visit: Payer: Self-pay | Admitting: Internal Medicine

## 2024-03-09 DIAGNOSIS — I2510 Atherosclerotic heart disease of native coronary artery without angina pectoris: Secondary | ICD-10-CM

## 2024-03-09 DIAGNOSIS — R0602 Shortness of breath: Secondary | ICD-10-CM

## 2024-03-09 DIAGNOSIS — E039 Hypothyroidism, unspecified: Secondary | ICD-10-CM

## 2024-03-09 DIAGNOSIS — R079 Chest pain, unspecified: Secondary | ICD-10-CM

## 2024-03-09 DIAGNOSIS — R001 Bradycardia, unspecified: Secondary | ICD-10-CM

## 2024-03-10 ENCOUNTER — Encounter (HOSPITAL_COMMUNITY): Payer: Self-pay

## 2024-03-10 ENCOUNTER — Observation Stay (HOSPITAL_COMMUNITY)
Admission: EM | Admit: 2024-03-10 | Discharge: 2024-03-11 | Disposition: A | Discharge status: Home / Self Care | Payer: Self-pay | Source: Other Acute Inpatient Hospital | Attending: Cardiology | Admitting: Cardiology

## 2024-03-10 ENCOUNTER — Encounter (HOSPITAL_COMMUNITY)
Admission: EM | Disposition: A | Discharge status: Home / Self Care | Payer: Self-pay | Source: Other Acute Inpatient Hospital | Attending: Cardiology

## 2024-03-10 DIAGNOSIS — I10 Essential (primary) hypertension: Secondary | ICD-10-CM | POA: Insufficient documentation

## 2024-03-10 DIAGNOSIS — I25110 Atherosclerotic heart disease of native coronary artery with unstable angina pectoris: Principal | ICD-10-CM

## 2024-03-10 DIAGNOSIS — E109 Type 1 diabetes mellitus without complications: Secondary | ICD-10-CM | POA: Diagnosis present

## 2024-03-10 DIAGNOSIS — E785 Hyperlipidemia, unspecified: Secondary | ICD-10-CM | POA: Insufficient documentation

## 2024-03-10 DIAGNOSIS — E1169 Type 2 diabetes mellitus with other specified complication: Secondary | ICD-10-CM | POA: Diagnosis present

## 2024-03-10 DIAGNOSIS — Z79899 Other long term (current) drug therapy: Secondary | ICD-10-CM | POA: Insufficient documentation

## 2024-03-10 DIAGNOSIS — E119 Type 2 diabetes mellitus without complications: Secondary | ICD-10-CM | POA: Insufficient documentation

## 2024-03-10 DIAGNOSIS — Z951 Presence of aortocoronary bypass graft: Secondary | ICD-10-CM

## 2024-03-10 DIAGNOSIS — I200 Unstable angina: Principal | ICD-10-CM

## 2024-03-10 DIAGNOSIS — I214 Non-ST elevation (NSTEMI) myocardial infarction: Secondary | ICD-10-CM

## 2024-03-10 DIAGNOSIS — Z955 Presence of coronary angioplasty implant and graft: Principal | ICD-10-CM | POA: Insufficient documentation

## 2024-03-10 DIAGNOSIS — Z794 Long term (current) use of insulin: Secondary | ICD-10-CM | POA: Insufficient documentation

## 2024-03-10 DIAGNOSIS — I152 Hypertension secondary to endocrine disorders: Secondary | ICD-10-CM | POA: Diagnosis present

## 2024-03-10 DIAGNOSIS — E039 Hypothyroidism, unspecified: Secondary | ICD-10-CM | POA: Insufficient documentation

## 2024-03-10 DIAGNOSIS — Z7982 Long term (current) use of aspirin: Secondary | ICD-10-CM | POA: Insufficient documentation

## 2024-03-10 HISTORY — PX: LEFT HEART CATH AND CORS/GRAFTS ANGIOGRAPHY: CATH118250

## 2024-03-10 HISTORY — PX: CORONARY STENT INTERVENTION: CATH118234

## 2024-03-10 LAB — POCT ACTIVATED CLOTTING TIME
Activated Clotting Time: 262 s
Activated Clotting Time: 314 s
Activated Clotting Time: 204 s
Activated Clotting Time: 182 s

## 2024-03-10 LAB — GLUCOSE, CAPILLARY
Glucose-Capillary: 282 mg/dL — ABNORMAL HIGH (ref 70–99)
Glucose-Capillary: 243 mg/dL — ABNORMAL HIGH (ref 70–99)

## 2024-03-10 SURGERY — LEFT HEART CATH AND CORS/GRAFTS ANGIOGRAPHY
Anesthesia: LOCAL

## 2024-03-10 MED ORDER — LISINOPRIL 10 MG PO TABS
10.0000 mg | ORAL_TABLET | Freq: Every day | ORAL | Status: DC
Start: 2024-03-11 — End: 2024-03-11
  Administered 2024-03-11: 10 mg via ORAL
  Filled 2024-03-10: qty 1

## 2024-03-10 MED ORDER — ONDANSETRON HCL 4 MG/2ML IJ SOLN: 4.0000 mg | Freq: Four times a day (QID) | INTRAMUSCULAR | Status: DC | PRN

## 2024-03-10 MED ORDER — SODIUM CHLORIDE 0.9 % WEIGHT BASED INFUSION: 1.0000 mL/kg/h | INTRAVENOUS | Status: DC

## 2024-03-10 MED ORDER — IOHEXOL 350 MG/ML SOLN
INTRAVENOUS | Status: DC | PRN
  Administered 2024-03-10: 110 mL

## 2024-03-10 MED ORDER — HEPARIN (PORCINE) IN NACL 1000-0.9 UT/500ML-% IV SOLN
INTRAVENOUS | Status: DC | PRN
  Administered 2024-03-10 (×2): 500 mL

## 2024-03-10 MED ORDER — HEPARIN SODIUM (PORCINE) 1000 UNIT/ML IJ SOLN
INTRAMUSCULAR | Status: DC | PRN
  Administered 2024-03-10: 8000 [IU] via INTRAVENOUS

## 2024-03-10 MED ORDER — SERTRALINE HCL 50 MG PO TABS
50.0000 mg | ORAL_TABLET | Freq: Every day | ORAL | Status: DC
Start: 2024-03-11 — End: 2024-03-11
  Administered 2024-03-11: 50 mg via ORAL
  Filled 2024-03-10: qty 1

## 2024-03-10 MED ORDER — HYDRALAZINE HCL 20 MG/ML IJ SOLN
10.0000 mg | INTRAMUSCULAR | Status: AC | PRN
Start: 2024-03-10 — End: 2024-03-11

## 2024-03-10 MED ORDER — SODIUM CHLORIDE 0.9% FLUSH
3.0000 mL | Freq: Two times a day (BID) | INTRAVENOUS | Status: DC
  Administered 2024-03-10: 3 mL via INTRAVENOUS

## 2024-03-10 MED ORDER — SODIUM CHLORIDE 0.9 % IV SOLN: 250.0000 mL | INTRAVENOUS | Status: DC | PRN

## 2024-03-10 MED ORDER — FLUTICASONE PROPIONATE 50 MCG/ACT NA SUSP
1.0000 | Freq: Every day | NASAL | Status: DC
  Administered 2024-03-10: 1 via NASAL
  Filled 2024-03-10: qty 16

## 2024-03-10 MED ORDER — LEVOTHYROXINE SODIUM 75 MCG PO TABS
150.0000 ug | ORAL_TABLET | Freq: Every day | ORAL | Status: DC
  Administered 2024-03-11: 150 ug via ORAL
  Filled 2024-03-10: qty 1
  Filled 2024-03-10: qty 2

## 2024-03-10 MED ORDER — PREGABALIN 75 MG PO CAPS
75.0000 mg | ORAL_CAPSULE | Freq: Two times a day (BID) | ORAL | Status: DC
  Administered 2024-03-10 – 2024-03-11 (×2): 75 mg via ORAL
  Filled 2024-03-10 (×2): qty 1

## 2024-03-10 MED ORDER — ACETAMINOPHEN 325 MG PO TABS
650.0000 mg | ORAL_TABLET | ORAL | Status: DC | PRN
  Administered 2024-03-11: 650 mg via ORAL
  Filled 2024-03-10: qty 2

## 2024-03-10 MED ORDER — SODIUM CHLORIDE 0.9 % IV SOLN: INTRAVENOUS | Status: AC

## 2024-03-10 MED ORDER — INSULIN NPH (HUMAN) (ISOPHANE) 100 UNIT/ML ~~LOC~~ SUSP
36.0000 [IU] | Freq: Every day | SUBCUTANEOUS | Status: DC
Start: 2024-03-11 — End: 2024-03-11
  Administered 2024-03-11: 36 [IU] via SUBCUTANEOUS
  Filled 2024-03-10: qty 10

## 2024-03-10 MED ORDER — HEPARIN SODIUM (PORCINE) 1000 UNIT/ML IJ SOLN
INTRAMUSCULAR | Status: AC
  Filled 2024-03-10: qty 10

## 2024-03-10 MED ORDER — ASPIRIN 81 MG PO CHEW
CHEWABLE_TABLET | ORAL | Status: AC
  Filled 2024-03-10: qty 1

## 2024-03-10 MED ORDER — ASPIRIN 81 MG PO TBEC
81.0000 mg | DELAYED_RELEASE_TABLET | Freq: Every day | ORAL | Status: DC
  Administered 2024-03-11: 81 mg via ORAL
  Filled 2024-03-10: qty 1

## 2024-03-10 MED ORDER — SODIUM CHLORIDE 0.9% FLUSH: 3.0000 mL | INTRAVENOUS | Status: DC | PRN

## 2024-03-10 MED ORDER — ASPIRIN 81 MG PO CHEW
81.0000 mg | CHEWABLE_TABLET | ORAL | Status: AC
  Administered 2024-03-10: 81 mg via ORAL

## 2024-03-10 MED ORDER — PANTOPRAZOLE SODIUM 40 MG PO TBEC
40.0000 mg | DELAYED_RELEASE_TABLET | Freq: Every day | ORAL | Status: DC
  Administered 2024-03-10 – 2024-03-11 (×2): 40 mg via ORAL
  Filled 2024-03-10 (×2): qty 1

## 2024-03-10 MED ORDER — ENOXAPARIN SODIUM 40 MG/0.4ML IJ SOSY
40.0000 mg | PREFILLED_SYRINGE | INTRAMUSCULAR | Status: DC
  Administered 2024-03-11: 40 mg via SUBCUTANEOUS
  Filled 2024-03-10: qty 0.4

## 2024-03-10 MED ORDER — LIDOCAINE HCL (PF) 1 % IJ SOLN
INTRAMUSCULAR | Status: DC | PRN
  Administered 2024-03-10: 10 mL

## 2024-03-10 MED ORDER — LIDOCAINE HCL (PF) 1 % IJ SOLN
INTRAMUSCULAR | Status: AC
  Filled 2024-03-10: qty 30

## 2024-03-10 MED ORDER — INSULIN LISPRO (0.5 UNIT DIAL) 100 UNIT/ML (KWIKPEN JR): 5.0000 [IU] | PEN_INJECTOR | Freq: Three times a day (TID) | SUBCUTANEOUS | Status: DC

## 2024-03-10 MED ORDER — PRASUGREL HCL 10 MG PO TABS
10.0000 mg | ORAL_TABLET | Freq: Every day | ORAL | Status: DC
  Administered 2024-03-11: 10 mg via ORAL
  Filled 2024-03-10: qty 1

## 2024-03-10 MED ORDER — INSULIN ASPART 100 UNIT/ML IJ SOLN: 5.0000 [IU] | Freq: Three times a day (TID) | INTRAMUSCULAR | Status: DC

## 2024-03-10 MED ORDER — SODIUM CHLORIDE 0.9 % WEIGHT BASED INFUSION
3.0000 mL/kg/h | INTRAVENOUS | Status: DC
  Administered 2024-03-10: 3 mL/kg/h via INTRAVENOUS

## 2024-03-10 SURGICAL SUPPLY — 18 items
BALLOON EMERGE MR 2.5X12 (BALLOONS) IMPLANT
BALLOON ~~LOC~~ EMERGE MR 3.75X8 (BALLOONS) IMPLANT
CATH INFINITI 5 FR IM (CATHETERS) IMPLANT
CATH INFINITI 5FR MULTPACK ANG (CATHETERS) IMPLANT
CATH VISTA GUIDE 6FR JR4 ECOPK (CATHETERS) IMPLANT
ELECT DEFIB PAD ADLT CADENCE (PAD) IMPLANT
KIT ENCORE 26 ADVANTAGE (KITS) IMPLANT
KIT HEART LEFT (KITS) IMPLANT
KIT MICROPUNCTURE NIT STIFF (SHEATH) IMPLANT
PACK CARDIAC CATHETERIZATION (CUSTOM PROCEDURE TRAY) ×1 IMPLANT
SHEATH PINNACLE 5F 10CM (SHEATH) IMPLANT
SHEATH PINNACLE 6F 10CM (SHEATH) IMPLANT
SHEATH PROBE COVER 6X72 (BAG) IMPLANT
STENT SYNERGY XD 3.50X12 (Permanent Stent) IMPLANT
TRANSDUCER W/STOPCOCK (MISCELLANEOUS) IMPLANT
WIRE ASAHI PROWATER 180CM (WIRE) IMPLANT
WIRE EMERALD 3MM-J .035X150CM (WIRE) IMPLANT
WIRE EMERALD 3MM-J .035X260CM (WIRE) IMPLANT

## 2024-03-10 NOTE — Progress Notes (Signed)
 Verified with the patient, he is no longer on insulin  pump. He takes a 36 units of NPH every morning and uses 5 units of lispro 3 times a day with meals. (Although PCP's note in Feb 2025 mentioned he is on 26 units of NPH every morning, however patient is very certain he is taking 36 units of NPH every morning).  Insulin  pump order cancelled and replaced with home insulin  dose and BG meal check

## 2024-03-10 NOTE — H&P (Addendum)
 Cardiology Admission History and Physical   Patient ID: Louis Byrd MRN: [id number]; DOB: 1975-12-01   Admission date: 03/10/2024  PCP:  Louis Dirks, MD   [facility] Providers Cardiologist:  Lamar Fitch, MD    Chief Complaint: Chest pain, unstable angina   Patient Profile: Louis Byrd is a 48 y.o. male with Past medical history of CAD s/p CABGx5 in 2019, PTCA/DES x 2 mRCA and x 1 RPAV, hypertension, type I DM, hypothyroidism, seizures, alcohol abuse who is being seen 03/10/2024 for the evaluation of unstable angina.   History of Present Illness: Mr. Louis Byrd is a 48 year old male with above medical history who is followed by Dr. Fitch.  Per chart review, patient previously had CABG x 5 in 2019 with LIMA-LAD, SVG-diagonal, radial artery graft-OM, SVG-PDA and PLA.   Later admitted in 03/2023 with NSTEMI.  At that time, patient had been admitted to [facility] for management of DKA.  Troponin 2277>2262.  Echocardiogram showed EF 55-60%, no regional wall motion abnormalities.  Patient was transferred to [facility] and underwent left heart catheterization on 03/21/2023.  Found to have 2/5 patent grafts (LIMA-LAD and radial artery graft-OM).  SVG to diagonal, SVG-PDA and PLA occluded.  The RCA was unprotected, underwent successful PTCA/DES x2 to the mid RCA. Also underwent successful PTCA/DES x1 to the posterolateral artery.  Recommended DAPT with aspirin  and effient  for at least 1 year.  Patient was last seen by cardiology on 03/30/2023.  At that time, he continued to have some atypical chest pain.  Started on Imdur .  Remained on Crestor .  Patient was committed to changing his lifestyle, specifically alcohol cessation.  He was on gabapentin  to help with alcohol abuse  Patient presented to [facility] ED on 7/27 complaining of substernal chest pain.  Improved with sublingual nitroglycerin .  Reported that chest discomfort was similar to last year when he had his  NSTEMI.  Labs in the ED significant for - Na 137 - K 4.9 - Creatinine 0.70  - AST 28, ALT 19 - WBC 7.1 - Hemoglobin 15 - Troponin I negative x3 - pro BNP 35   CXR showed no acute findings. Initially planned to pursue stress test and echocardiogram. However, patient continued to have chest pain into 7/28. Decided to proceed with cardiac catheterization. Patient transferred to [facility] and admitted to the cardiology service.   Past Medical History:  Diagnosis Date   Arthritis    Atypical chest pain 09/25/2017   Bursitis of right hip    Chest pain    a. 1/2/019 Ex MV Mazie): Ex time 8:00 No ecg changes. Large severe apical rev defect and small mod sev apicoseptal rev defect. EF 42%.   Coronary artery disease    Diabetes (HCC) 09/25/2017   Essential hypertension 01/29/2018   Hyperlipidemia    Hypothyroidism    Palpitations    a. premature atrial contractions.   Pulmonary nodule    a. 08/2017 CXR - [facility]: pulm nodule posterior lung base on lateral film, rec noncontrast chest CT for further eval.   S/P CABG x 5 10/12/2017   Seizures (HCC)    only related to low blood sugars (09/25/2017)   Solitary pulmonary nodule 09/25/2017   Type I diabetes mellitus (HCC)    a. Dx age 89;  b. Most recent A1c 6.3.   Unstable angina [facility])    Past Surgical History:  Procedure Laterality Date   CORONARY ARTERY BYPASS GRAFT N/A 10/01/2017   Procedure: CORONARY ARTERY BYPASS  GRAFTING (CABG) x5 using the left internal mammary artery, the right greater saphenous vein harvested endoscopically, and the left radial artery.;  Surgeon: Louis Elspeth BROCKS, MD;  Location: [facility] OR;  Service: Open Heart Surgery;  Laterality: N/A;   CORONARY STENT INTERVENTION N/A 03/21/2023   Procedure: CORONARY STENT INTERVENTION;  Surgeon: Louis Lonni BIRCH, MD;  Location: [facility] INVASIVE CV LAB;  Service: Cardiovascular;  Laterality: N/A;   I and D of groin  2017   for MRSA infection   KNEE ARTHROSCOPY Right 1990s    LEFT HEART CATH AND CORONARY ANGIOGRAPHY N/A 09/26/2017   Procedure: LEFT HEART CATH AND CORONARY ANGIOGRAPHY;  Surgeon: Louis Sharper, MD;  Location: [facility] INVASIVE CV LAB;  Service: Cardiovascular;  Laterality: N/A;   LEFT HEART CATH AND CORS/GRAFTS ANGIOGRAPHY N/A 03/21/2023   Procedure: LEFT HEART CATH AND CORS/GRAFTS ANGIOGRAPHY;  Surgeon: Louis Lonni BIRCH, MD;  Location: [facility] INVASIVE CV LAB;  Service: Cardiovascular;  Laterality: N/A;   RADIAL ARTERY HARVEST Left 10/01/2017   Procedure: RADIAL ARTERY HARVEST;  Surgeon: Louis Elspeth BROCKS, MD;  Location: [facility] OR;  Service: Open Heart Surgery;  Laterality: Left;   TEE WITHOUT CARDIOVERSION N/A 10/01/2017   Procedure: TRANSESOPHAGEAL ECHOCARDIOGRAM (TEE);  Surgeon: Louis Elspeth BROCKS, MD;  Location: [facility] OR;  Service: Open Heart Surgery;  Laterality: N/A;     Medications Prior to Admission: Prior to Admission medications   Medication Sig Start Date End Date Taking? Authorizing Provider  aspirin  EC 81 MG tablet Take 1 tablet (81 mg total) by mouth daily. 01/04/18   Krasowski, Robert J, MD  Azelastine  HCl 137 MCG/SPRAY SOLN PLACE 2 SPRAYS INTO BOTH NOSTRILS 2 (TWO) TIMES DAILY. USE IN EACH NOSTRIL AS DIRECTED 09/10/23   Louis Dirks, MD  Continuous Glucose Sensor (DEXCOM G6 SENSOR) MISC Use as directed for 30 days Patient taking differently: 1 each by Other route See admin instructions. Use as directed for 30 days 04/06/23   Amin, Saad, MD  Continuous Glucose Transmitter (DEXCOM G6 TRANSMITTER) MISC Use as directed for 30 days. Patient taking differently: 1 each by Other route See admin instructions. Use as directed for 30 days. 04/06/23   Amin, Saad, MD  fexofenadine  (ALLEGRA ) 180 MG tablet Take 1 tablet (180 mg total) by mouth daily. 09/28/23   Amin, Saad, MD  fluticasone  (FLONASE ) 50 MCG/ACT nasal spray Place 1 spray into both nostrils at bedtime. 03/26/20   [provider]  gabapentin  (NEURONTIN ) 300 MG capsule Take 1 capsule (300 mg  total) by mouth daily. 04/23/23 06/04/23  Amin, Saad, MD  Insulin  Disposable Pump (OMNIPOD 5 G6 PODS, GEN 5,) MISC Inject 1.3 units as needed, changing every 3 days Patient taking differently: 1.3 Units by Other route See admin instructions. Inject 1.3 units as needed, changing every 3 days 04/26/23   Louis Dirks, MD  Insulin  lispro (HUMALOG  JUNIOR KWIKPEN) 100 UNIT/ML Inject 5 Units into the skin 3 (three) times daily. 06/29/23   Amin, Saad, MD  levothyroxine  (SYNTHROID ) 150 MCG tablet TAKE 1 TABLET BY MOUTH EVERY DAY 01/30/24   Louis Dirks, MD  lisinopril  (ZESTRIL ) 10 MG tablet TAKE 1 TABLET BY MOUTH EVERY DAY 11/26/23   Louis Dirks, MD  nitroGLYCERIN  (NITROSTAT ) 0.4 MG SL tablet Place 1 tablet (0.4 mg total) under the tongue every 5 (five) minutes as needed. Patient taking differently: Place 0.4 mg under the tongue every 5 (five) minutes as needed for chest pain. 03/27/23 06/25/23  Krasowski, Robert J, MD  omeprazole (PRILOSEC) 20 MG capsule Take 40  mg by mouth in the morning and at bedtime.    [provider]  oxymetazoline  (AFRIN) 0.05 % nasal spray Place 1 spray into both nostrils 2 (two) times daily. 09/28/23   Amin, Saad, MD  pregabalin  (LYRICA ) 75 MG capsule Take 1 capsule (75 mg total) by mouth 2 (two) times daily. 10/12/23   Amin, Saad, MD  sertraline  (ZOLOFT ) 50 MG tablet TAKE 1 TABLET BY MOUTH EVERY DAY 07/16/23   Louis Dirks, MD     Allergies:    Allergies  Allergen Reactions   Morphine  Nausea And Vomiting    Social History:   Social History   Socioeconomic History   Marital status: Married    Spouse name: Not on file   Number of children: Not on file   Years of education: Not on file   Highest education level: Not on file  Occupational History   Occupation: Armed forces training and education officer    Comment: does some heavy exertion.  Tobacco Use   Smoking status: Never   Smokeless tobacco: Current    Types: Chew, Snuff  Vaping Use   Vaping status: Never Used  Substance and Sexual  Activity   Alcohol use: Not Currently   Drug use: Yes    Types: Marijuana   Sexual activity: Not on file  Other Topics Concern   Not on file  Social History Narrative   Lives in [place] with his wife and their children.   Social Drivers of Corporate investment banker Strain: High Risk (03/21/2023)   Overall Financial Resource Strain (CARDIA)    Difficulty of Paying Living Expenses: Hard  Food Insecurity: No Food Insecurity (03/21/2023)   Hunger Vital Sign    Worried About Running Out of Food in the Last Year: Never true    Ran Out of Food in the Last Year: Never true  Transportation Needs: Unmet Transportation Needs (03/21/2023)   PRAPARE - Administrator, Civil Service (Medical): Yes    Lack of Transportation (Non-Medical): Yes  Physical Activity: Inactive (03/21/2023)   Exercise Vital Sign    Days of Exercise per Week: 0 days    Minutes of Exercise per Session: 0 min  Stress: Stress Concern Present (03/21/2023)   [employer] of Occupational Health - Occupational Stress Questionnaire    Feeling of Stress : Rather much  Social Connections: Moderately Isolated (03/21/2023)   Social Connection and Isolation Panel    Frequency of Communication with Friends and Family: More than three times a week    Frequency of Social Gatherings with Friends and Family: Never    Attends Religious Services: Never    Database administrator or Organizations: No    Attends Banker Meetings: Never    Marital Status: Married  Catering manager Violence: Not At Risk (03/21/2023)   Humiliation, Afraid, Rape, and Kick questionnaire    Fear of Current or Ex-Partner: No    Emotionally Abused: No    Physically Abused: No    Sexually Abused: No     Family History:  The patient's family history includes Diabetes Mellitus I in his brother; Heart attack in his father.    ROS:  Please see the history of present illness.  All other ROS reviewed and negative.     Physical  Exam/Data: There were no vitals filed for this visit. No intake or output data in the 24 hours ending 03/10/24 1556    10/12/2023   10:16 AM 09/28/2023   11:02 AM 07/31/2023  3:45 PM  Last 3 Weights  Weight (lbs) 188 lb 4 oz 184 lb 4 oz 188 lb 6 oz  Weight (kg) 85.39 kg 83.575 kg 85.446 kg     There is no height or weight on file to calculate BMI.  General:  Resting in the bed. Laying flat with head elevated. Asleep, arouses to voice.   HEENT: normal Neck: no  JVD Cardiac:  normal S1, S2; RRR. Faint systolic murmur at RUSB  Lungs:  clear to auscultation bilaterally, no wheezing, rhonchi or rales  Abd: soft, nontender  Ext: no  edema Musculoskeletal:  No deformities Skin: warm and dry  Neuro:  CNs 2-12 intact, no focal abnormalities noted Psych:  Normal affect   Relevant CV Studies: Cardiac Studies & Procedures   ______________________________________________________________________________________________ CARDIAC CATHETERIZATION  CARDIAC CATHETERIZATION 03/21/2023  Conclusion   RPAV-2 lesion is 99% stenosed.   RPAV-1 lesion is 50% stenosed.   RPDA lesion is 40% stenosed.   Mid RCA lesion is 90% stenosed.   Origin to Prox Graft lesion before 3rd RPL  is 100% stenosed.   Prox Cx lesion is 90% stenosed.   Mid Cx lesion is 90% stenosed.   Mid LAD lesion is 100% stenosed.   1st Diag lesion is 100% stenosed.   Origin to Prox Graft lesion is 100% stenosed.   A drug-eluting stent was successfully placed using a STENT ONYX FRONTIER 2.25X12.   A drug-eluting stent was successfully placed using a STENT ONYX FRONTIER 3.0X38.   Post intervention, there is a 0% residual stenosis.   Post intervention, there is a 0% residual stenosis.   SVG graft was visualized by angiography.   LIMA graft was visualized by angiography and is normal in caliber.   SVG graft was visualized by angiography.   Left radial artery graft was visualized by angiography and is normal in caliber.   The left  ventricular systolic function is normal.   LV end diastolic pressure is normal.   The left ventricular ejection fraction is greater than 65% by visual estimate.  Triple vessel CAD s/p 5V CABG with 2/5 patent bypass grafts Chronic occlusion of the mid LAD. Patent LIMA to LAD. Occluded SVG to Diagonal. The diagonal fills from left to left collaterals. The Circumflex has severe proximal and mid stenosis. The radial artery graft to the obtuse marginal branch is patent The dominant RCA has severe mid vessel disease and severe disease in the posterolateral artery. The Sequential SVG to PDA and PLA is occluded The RCA is now unprotected. Successful PTCA/DES x 2 mid RCA. Successful PTCA/DES x 1 posterolateral artery  Recommendations: DAPT with ASA and Brilinta  for one year.  Findings Coronary Findings Diagnostic  Dominance: Right  Left Anterior Descending Vessel is large. Mid LAD lesion is 100% stenosed. The lesion is chronically occluded.  First Diagonal Branch Collaterals 1st Diag filled by collaterals from 3rd Diag.  1st Diag lesion is 100% stenosed. The lesion is chronically occluded.  Left Circumflex Vessel is large. Prox Cx lesion is 90% stenosed. Mid Cx lesion is 90% stenosed.  Right Coronary Artery Vessel is large. Mid RCA lesion is 90% stenosed.  Right Posterior Descending Artery RPDA lesion is 40% stenosed.  Right Posterior Atrioventricular Artery RPAV-1 lesion is 50% stenosed. RPAV-2 lesion is 99% stenosed.  Sequential Saphenous Graft To 3rd RPL, RPDA SVG graft was visualized by angiography. Origin to Prox Graft lesion before 3rd RPL  is 100% stenosed. The lesion is chronically occluded.  Left Radial Artery Graft To  2nd Mrg Left radial artery graft was visualized by angiography and is normal in caliber.  LIMA LIMA Graft To Dist LAD LIMA graft was visualized by angiography and is normal in caliber.  Saphenous Graft To 1st Diag SVG graft was visualized by  angiography. Origin to Prox Graft lesion is 100% stenosed. The lesion is chronically occluded.  Intervention  Mid RCA lesion Stent CATH LAUNCHER 6FR JR4 guide catheter was inserted. Lesion crossed with guidewire using a WIRE COUGAR XT STRL 190CM. Pre-stent angioplasty was performed using a BALLN SAPPHIRE 2.0X12. A drug-eluting stent was successfully placed using a STENT ONYX FRONTIER 3.0X38. Stent strut is well apposed. Post-stent angioplasty was performed using a BALLN [place] EMERGE MR 3.25X20. Post-Intervention Lesion Assessment The intervention was successful. Pre-interventional TIMI flow is 3. Post-intervention TIMI flow is 3. No complications occurred at this lesion. There is a 0% residual stenosis post intervention.  RPAV-2 lesion Stent CATH LAUNCHER 6FR JR4 guide catheter was inserted. Lesion crossed with guidewire using a WIRE COUGAR XT STRL 190CM. Pre-stent angioplasty was performed using a BALLN SAPPHIRE 2.0X12. A drug-eluting stent was successfully placed using a STENT ONYX FRONTIER 2.25X12. Stent strut is well apposed. Post-stent angioplasty was not performed. Post-Intervention Lesion Assessment The intervention was successful. Pre-interventional TIMI flow is 3. Post-intervention TIMI flow is 3. No complications occurred at this lesion. There is a 0% residual stenosis post intervention.   CARDIAC CATHETERIZATION  CARDIAC CATHETERIZATION 09/26/2017  Conclusion  Mid RCA lesion is 80% stenosed.  Post Atrio-1 lesion is 80% stenosed.  Post Atrio-2 lesion is 90% stenosed.  Ost Cx to Prox Cx lesion is 80% stenosed.  Prox Cx to Mid Cx lesion is 90% stenosed.  Ost 2nd Mrg lesion is 50% stenosed.  Ost 1st Diag to 1st Diag lesion is 95% stenosed.  Prox LAD lesion is 100% stenosed.  The left ventricular systolic function is normal.  LV end diastolic pressure is normal.  The left ventricular ejection fraction is 55-65% by visual estimate.  1. Severe multivessel CAD with  total occlusion of the mid-LAD, severe stenosis of the RCA, and severe stenosis of the left circumflex 2. Normal LV function with LVEF estimated at 60%, normal LVEDP  The patient has Type I DM and severe 3 vessel CAD with normal LV function, surgical coronary anatomy. Will place TCTS consult for CABG. Start heparin  after TR band off.  Findings Coronary Findings Diagnostic  Dominance: Right  Left Main The vessel exhibits minimal luminal irregularities.  Left Anterior Descending Prox LAD lesion is 100% stenosed.  First Diagonal Branch Ost 1st Diag to 1st Diag lesion is 95% stenosed. Diffusely diseased vessel  First Septal Branch Collaterals 1st Sept filled by collaterals from RPDA.  Second Septal Branch Collaterals 2nd Sept filled by collaterals from 1st RPL.  Left Circumflex Ost Cx to Prox Cx lesion is 80% stenosed. Prox Cx to Mid Cx lesion is 90% stenosed.  Second Obtuse Marginal Branch Ost 2nd Mrg lesion is 50% stenosed.  Right Coronary Artery Large, dominant vessel with severe distal vessel disease extending into the PLA branches. The RCA supplies collateral filling of the LAD Mid RCA lesion is 80% stenosed.  Right Posterior Atrioventricular Artery Post Atrio-1 lesion is 80% stenosed. Post Atrio-2 lesion is 90% stenosed.  Intervention  No interventions have been documented.     ECHOCARDIOGRAM  ECHOCARDIOGRAM COMPLETE 05/06/2019  Narrative ECHOCARDIOGRAM REPORT    Patient Name:   EXAVIER LINA Date of Exam: 05/06/2019 Medical Rec #:  [id number]  Height:       70.0 in Accession #:    [id number]    Weight:       183.4 lb Date of Birth:  01/25/1976      BSA:          2.01 m Patient Age:    43 years      BP:           114/64 mmHg Patient Gender: M             HR:           90 bpm. Exam Location:  [place]  Procedure: 2D Echo  Indications:    Essential hypertension [I10]  History:        Patient has prior history of Echocardiogram examinations,  most recent 09/26/2017. CAD; Prior CABG Risk Factors:Hypertension and Dyslipidemia.  Sonographer:    Lynwood Silvas Referring Phys: [id number] ROBERT J KRASOWSKI  IMPRESSIONS   1. Left ventricular ejection fraction, by visual estimation, is 55 to 60%. The left ventricle has normal function. Normal left ventricular size. There is no left ventricular hypertrophy. 2. Global right ventricle looks normal visually (despite the low TASPE).The right ventricular size is normal. No increase in right ventricular wall thickness. 3. Left atrial size was normal. 4. Right atrial size was normal. 5. The mitral valve is normal in structure. Trace mitral valve regurgitation. No evidence of mitral stenosis. 6. The tricuspid valve is normal in structure. Tricuspid valve regurgitation was not visualized by color flow Doppler. 7. The aortic valve is normal in structure. Aortic valve regurgitation was not visualized by color flow Doppler. Structurally normal aortic valve, with no evidence of sclerosis or stenosis. 8. The pulmonic valve was normal in structure. Pulmonic valve regurgitation is not visualized by color flow Doppler. 9. Normal pulmonary artery systolic pressure. 10. The inferior vena cava is normal in size with greater than 50% respiratory variability, suggesting right atrial pressure of 3 mmHg.  FINDINGS Left Ventricle: Left ventricular ejection fraction, by visual estimation, is 55 to 60%. The left ventricle has normal function. There is no left ventricular hypertrophy. Normal left ventricular size.  Right Ventricle: The right ventricular size is normal. No increase in right ventricular wall thickness. Global RV systolic function is looks normal visually (despite the low TASPE). The tricuspid regurgitant velocity is 2.22 m/s, and with an assumed right atrial pressure of 3 mmHg, the estimated right ventricular systolic pressure is normal at 22.7 mmHg.  Left Atrium: Left atrial size was normal in  size.  Right Atrium: Right atrial size was normal in size  Pericardium: There is no evidence of pericardial effusion.  Mitral Valve: The mitral valve is normal in structure. No evidence of mitral valve stenosis by observation. Trace mitral valve regurgitation.  Tricuspid Valve: The tricuspid valve is normal in structure. Tricuspid valve regurgitation was not visualized by color flow Doppler.  Aortic Valve: The aortic valve is normal in structure. Aortic valve regurgitation was not visualized by color flow Doppler. The aortic valve is structurally normal, with no evidence of sclerosis or stenosis.  Pulmonic Valve: The pulmonic valve was normal in structure. Pulmonic valve regurgitation is not visualized by color flow Doppler.  Aorta: The aortic root, ascending aorta and aortic arch are all structurally normal, with no evidence of dilitation or obstruction.  Venous: The inferior vena cava is normal in size with greater than 50% respiratory variability, suggesting right atrial pressure of 3 mmHg.  IAS/Shunts: No atrial level shunt detected by color  flow Doppler. No ventricular septal defect is seen or detected. There is no evidence of an atrial septal defect.    LEFT VENTRICLE          Normals PLAX 2D LVIDd:         4.90 cm  3.6 cm       Diastology                  Normals LVIDs:         3.40 cm  1.7 cm       LV e' lateral:   11.00 cm/s 6.42 cm/s LV PW:         1.00 cm  1.4 cm       LV E/e' lateral: 8.0        15.4 LV IVS:        1.00 cm  1.3 cm       LV e' medial:    7.29 cm/s  6.96 cm/s LVOT diam:     2.10 cm  2.0 cm       LV E/e' medial:  12.0       6.96 LV SV:         65 ml    79 ml LV SV Index:   32.06    45 ml/m2 LVOT Area:     3.46 cm 3.14 cm2  LV Volumes (MOD)             Normals LV area d, A2C:    23.10 cm LV area d, A4C:    26.10 cm LV area s, A2C:    15.20 cm LV area s, A4C:    16.20 cm LV major d, A2C:   7.77 cm LV major d, A4C:   8.27 cm LV major s, A2C:   7.01  cm LV major s, A4C:   6.98 cm LV vol d, MOD A2C: 57.7 ml   68 ml LV vol d, MOD A4C: 68.6 ml LV vol s, MOD A2C: 28.5 ml   24 ml LV vol s, MOD A4C: 32.0 ml LV SV MOD A2C:     29.2 ml LV SV MOD A4C:     68.6 ml LV SV MOD BP:      34.4 ml   45 ml  RIGHT VENTRICLE         IVC TAPSE (M-mode): 1.5 cm  IVC diam: 1.90 cm  LEFT ATRIUM           Index       RIGHT ATRIUM           Index LA diam:      3.40 cm 1.69 cm/m  RA Area:     12.70 cm LA Vol (A2C): 47.4 ml 23.56 ml/m RA Volume:   30.40 ml  15.11 ml/m LA Vol (A4C): 41.0 ml 20.38 ml/m AORTIC VALVE             Normals LVOT Vmax:   118.00 cm/s LVOT Vmean:  77.100 cm/s 75 cm/s LVOT VTI:    0.208 m     25.3 cm  AORTA                 Normals Ao Root diam: 3.10 cm 31 mm Ao Asc diam:  3.40 cm 31 mm  MITRAL VALVE              Normals   TRICUSPID VALVE             Normals MV Area (  PHT): 3.48 cm             TR Peak grad:   19.7 mmHg MV PHT:        63.22 msec 55 ms     TR Vmax:        225.00 cm/s 288 cm/s MV Decel Time: 218 msec   187 ms MV E velocity: 87.80 cm/s 103 cm/s  SHUNTS MV A velocity: 59.20 cm/s 70.3 cm/s Systemic VTI:  0.21 m MV E/A ratio:  1.48       1.5       Systemic Diam: 2.10 cm   Kardie Tobb DO Electronically signed by Dub Huntsman DO Signature Date/Time: 05/06/2019/2:50:07 PM    Final   TEE  ECHO TEE 10/01/2017  Interpretation Summary  Aortic valve: The valve is trileaflet. No stenosis. No regurgitation.  Right ventricle: Normal cavity size, wall thickness and ejection fraction.  Tricuspid valve: Mild regurgitation. The tricuspid valve regurgitation jet is central.        ______________________________________________________________________________________________       Laboratory Data: High Sensitivity Troponin:  No results for input(s): TROPONINIHS in the last 720 hours.    ChemistryNo results for input(s): NA, K, CL, CO2, GLUCOSE, BUN, CREATININE, CALCIUM , MG, GFRNONAA,  GFRAA, ANIONGAP in the last 168 hours.  No results for input(s): PROT, ALBUMIN , AST, ALT, ALKPHOS, BILITOT in the last 168 hours. Lipids No results for input(s): CHOL, TRIG, HDL, LABVLDL, LDLCALC, CHOLHDL in the last 168 hours. HematologyNo results for input(s): WBC, RBC, HGB, HCT, MCV, MCH, MCHC, RDW, PLT in the last 168 hours. Thyroid  No results for input(s): TSH, FREET4 in the last 168 hours. BNPNo results for input(s): BNP, PROBNP in the last 168 hours.  DDimer No results for input(s): DDIMER in the last 168 hours.  Radiology/Studies:  No results found.   Assessment and Plan:  Unstable Angina  CAD s/p CABG  - Most recent cath in 03/21/2023 -Found to have 2/5 patent grafts (LIMA-LAD and radial artery graft-OM).  SVG to diagonal, SVG-PDA and PLA occluded.  The RCA was unprotected, underwent successful PTCA/DES x2 to the mid RCA. Also underwent successful PTCA/DES x1 to the posterolateral artery - Patient presented to [facility] ED yesterday with chest pain. Felt similar to previous NSTEMI pain. Troponin I negative x3. No acute ST or T wave changes. Initially planned for echo and stress test, but patient continued to have chest pain. Now pending cath at [facility]  - Continue ASA 81 mg daily - does not appear to be on cholesterol medication - reports that he could not afford it in the past. Will need to be on cholesterol medication moving forward   HLD  - Lipid panel from 06/2023 showed LDL 117 - as above, needs to start cholesterol medication post cath   Type 1 DM  - On insulin    History of alcohol abuse - Patient reports that he has not had alcohol in about 1 year. Congratulated him on this   Risk Assessment/Risk Scores:  TIMI Risk Score for Unstable Angina or Non-ST Elevation MI:   The patient's TIMI risk score is 4, which indicates a 20% risk of all cause mortality, new or recurrent myocardial infarction or need for urgent  revascularization in the next 14 days.     Code Status: Full Code  Severity of Illness: The appropriate patient status for this patient is OBSERVATION. Observation status is judged to be reasonable and necessary in order to provide the required intensity of service to ensure  the patient's safety. The patient's presenting symptoms, physical exam findings, and initial radiographic and laboratory data in the context of their medical condition is felt to place them at decreased risk for further clinical deterioration. Furthermore, it is anticipated that the patient will be medically stable for discharge from the hospital within 2 midnights of admission.   For questions or updates, please contact [facility] Please consult [email or web address] for contact info under     Signed, Rollo FABIENE Louder, PA-C  03/10/2024 3:56 PM

## 2024-03-10 NOTE — Interval H&P Note (Signed)
 History and Physical Interval Note:  03/10/2024 4:54 PM  Louis Byrd  has presented today for surgery, with the diagnosis of chest pain.  The various methods of treatment have been discussed with the patient and family. After consideration of risks, benefits and other options for treatment, the patient has consented to  Procedure(s): LEFT HEART CATH AND CORS/GRAFTS ANGIOGRAPHY (N/A) as a surgical intervention.  The patient's history has been reviewed, patient examined, no change in status, stable for surgery.  I have reviewed the patient's chart and labs.  Questions were answered to the patient's satisfaction.   Cath Lab Visit (complete for each Cath Lab visit)  Clinical Evaluation Leading to the Procedure:   ACS: Yes.    Non-ACS:    Anginal Classification: CCS III  Anti-ischemic medical therapy: No Therapy  Non-Invasive Test Results: No non-invasive testing performed  Prior CABG: Previous CABG        [facility]ude [facility] 03/10/2024 4:54 PM

## 2024-03-10 NOTE — Progress Notes (Signed)
 58fr sheath aspirated and removed from right femoral artery. Manual pressure applied for 30 minutes. Site level 0 no s+s of hematoma. Tegaderm dressing applied, bedrest instructions given.   Bilateral dp and pt pulses present with doppler.    Bedrest begins at 08:50:00

## 2024-03-11 ENCOUNTER — Telehealth: Payer: Self-pay | Admitting: Cardiology

## 2024-03-11 ENCOUNTER — Other Ambulatory Visit: Payer: Self-pay

## 2024-03-11 ENCOUNTER — Encounter (HOSPITAL_COMMUNITY): Payer: Self-pay | Admitting: Cardiology

## 2024-03-11 ENCOUNTER — Other Ambulatory Visit (HOSPITAL_COMMUNITY): Payer: Self-pay

## 2024-03-11 LAB — CBC
HCT: 40.2 % (ref 39.0–52.0)
HCT: 41.3 % (ref 39.0–52.0)
Hemoglobin: 13.4 g/dL (ref 13.0–17.0)
Hemoglobin: 13.9 g/dL (ref 13.0–17.0)
MCH: 28.2 pg (ref 26.0–34.0)
MCH: 28.4 pg (ref 26.0–34.0)
MCHC: 33.3 g/dL (ref 30.0–36.0)
MCHC: 33.7 g/dL (ref 30.0–36.0)
MCV: 84.5 fL (ref 80.0–100.0)
MCV: 84.5 fL (ref 80.0–100.0)
Platelets: 258 K/uL (ref 150–400)
Platelets: 258 K/uL (ref 150–400)
RBC: 4.76 MIL/uL (ref 4.22–5.81)
RBC: 4.89 MIL/uL (ref 4.22–5.81)
RDW: 13.8 % (ref 11.5–15.5)
RDW: 13.9 % (ref 11.5–15.5)
WBC: 14 K/uL — ABNORMAL HIGH (ref 4.0–10.5)
WBC: 16.1 K/uL — ABNORMAL HIGH (ref 4.0–10.5)
nRBC: 0 % (ref 0.0–0.2)
nRBC: 0 % (ref 0.0–0.2)

## 2024-03-11 LAB — BASIC METABOLIC PANEL WITH GFR
Anion gap: 13 (ref 5–15)
BUN: 14 mg/dL (ref 6–20)
CO2: 21 mmol/L — ABNORMAL LOW (ref 22–32)
Calcium: 9.1 mg/dL (ref 8.9–10.3)
Chloride: 105 mmol/L (ref 98–111)
Creatinine, Ser: 1.03 mg/dL (ref 0.61–1.24)
GFR, Estimated: >60 mL/min (ref >60)
Glucose, Bld: 234 mg/dL — ABNORMAL HIGH (ref 70–99)
Potassium: 4.2 mmol/L (ref 3.5–5.1)
Sodium: 139 mmol/L (ref 135–145)

## 2024-03-11 LAB — GLUCOSE, CAPILLARY
Glucose-Capillary: 215 mg/dL — ABNORMAL HIGH (ref 70–99)
Glucose-Capillary: 253 mg/dL — ABNORMAL HIGH (ref 70–99)

## 2024-03-11 LAB — CREATININE, SERUM
Creatinine, Ser: 1.06 mg/dL (ref 0.61–1.24)
GFR, Estimated: >60 mL/min (ref >60)

## 2024-03-11 LAB — HEMOGLOBIN A1C
Hgb A1c MFr Bld: 7.4 % — ABNORMAL HIGH (ref 4.8–5.6)
Mean Plasma Glucose: 165.68 mg/dL

## 2024-03-11 MED ORDER — ASPIRIN 81 MG PO TBEC
81.0000 mg | DELAYED_RELEASE_TABLET | Freq: Every day | ORAL | 3 refills | Status: AC
  Filled 2024-03-11: qty 90, 90d supply, fill #0

## 2024-03-11 MED ORDER — FEXOFENADINE HCL 180 MG PO TABS: 180.0000 mg | ORAL_TABLET | Freq: Every day | ORAL | Status: AC | PRN

## 2024-03-11 MED ORDER — NITROGLYCERIN 0.4 MG SL SUBL
0.4000 mg | SUBLINGUAL_TABLET | SUBLINGUAL | 3 refills | Status: AC | PRN
  Filled 2024-03-11: qty 25, 30d supply, fill #0

## 2024-03-11 MED ORDER — INSULIN LISPRO (0.5 UNIT DIAL) 100 UNIT/ML (KWIKPEN JR): 0.0000 [IU] | PEN_INJECTOR | SUBCUTANEOUS | Status: AC | PRN

## 2024-03-11 MED ORDER — ROSUVASTATIN CALCIUM 40 MG PO TABS
40.0000 mg | ORAL_TABLET | Freq: Every day | ORAL | 3 refills | Status: AC
  Filled 2024-03-11: qty 60, 60d supply, fill #1
  Filled 2024-03-11: qty 90, 90d supply, fill #0
  Filled 2024-03-11: qty 30, 30d supply, fill #0

## 2024-03-11 MED ORDER — ROSUVASTATIN CALCIUM 20 MG PO TABS
40.0000 mg | ORAL_TABLET | Freq: Every day | ORAL | Status: DC
  Administered 2024-03-11: 40 mg via ORAL
  Filled 2024-03-11: qty 2

## 2024-03-11 NOTE — TOC CM/SW Note (Signed)
 Transition of Care [facility]) - Inpatient Brief Assessment   Patient Details  Name: Louis Byrd MRN: [id number] Date of Birth: Sep 19, 1975  Transition of Care [facility]) CM/SW Contact:    Sudie Erminio Deems, RN Phone Number: 03/11/2024, 10:09 AM   Clinical Narrative: Patient presented for unstable angina-post cath. PTA patient was independent from home with spouse. Patient states he is recently unemployed- no insurance. Has PCP; however, wants a new provider. Case Manager has asked the CMA to schedule a new PCP appointment. Case Manager has completed the [name] Letter for the patient at zero co pay. Patient states he has transportation home. No further needs identified at this time.      MATCH MEDICATION ASSISTANCE CARD Pharmacies please call [phone] for claim processing assistance.  Rx BIN: [id number] Rx Group: [id number] Rx PCN: [id number] Relationship Code: 1 Person Code: [id number]  Patient ID (MRN): [name]    Patient Name: Louis Byrd    Patient DOB: 07-09-1976   Discharge Date: 03-11-24   Expiration Date: 03-20-24 (must be filled within 7 days of discharge)      Transition of Care Asessment: Insurance and Status:  (No insurance- recently lost job. Declined FC to check to see if eligible for Medicaid.) Patient has primary care physician: Yes (Patient wants a new provider in [place] recently unemployed.) Home environment has been reviewed: reviewed Prior level of function:: independent Prior/Current Home Services: No current home services Social Drivers of Health Review: SDOH reviewed no interventions necessary Readmission risk has been reviewed: Yes Transition of care needs: no transition of care needs at this time

## 2024-03-11 NOTE — Discharge Summary (Signed)
 Discharge Summary   Patient ID: Louis Byrd MRN: [id number]; DOB: [birth year]  Admit date: 03/10/2024 Discharge date: 03/11/2024  PCP:  Caleen Dirks, MD   [facility] Providers Cardiologist:  Lamar Fitch, MD     Discharge Diagnoses  Principal Problem:   Unstable angina [facility]) Active Problems:   Hyperlipidemia associated with type 2 diabetes mellitus (HCC)   Hypertension associated with diabetes (HCC)   Type 1 diabetes [facility])   Diagnostic Studies/Procedures   Cath: 03/10/2024    Mid LAD lesion is 100% stenosed.   Prox Cx lesion is 90% stenosed.   Mid Cx lesion is 90% stenosed.   Origin to Prox Graft lesion before 3rd RPL  is 100% stenosed.   Origin to Prox Graft lesion is 100% stenosed.   1st Diag lesion is 100% stenosed.   RPAV-1 lesion is 50% stenosed.   RPDA lesion is 40% stenosed.   Prox RCA lesion is 85% stenosed.   Non-stenotic Mid RCA lesion was previously treated.   Non-stenotic RPAV-2 lesion was previously treated.   A drug-eluting stent was successfully placed using a STENT SYNERGY XD 3.50X12.   Post intervention, there is a 0% residual stenosis.   SVG.   Left radial artery and is normal in caliber.   LIMA and is normal in caliber.   SVG.   The left ventricular systolic function is normal.   LV end diastolic pressure is normal.   The left ventricular ejection fraction is 55-65% by visual estimate.   Recommend uninterrupted dual antiplatelet therapy with Aspirin  81mg  daily and Prasugrel  10mg  daily for a minimum of 12 months (ACS-Class I recommendation).   Severe 3 vessel obstructive CAD Patent LIMA to the LAD.  Patent free radial artery graft to OM Chronically occluded SVG to diagonal and SVG to RCA Prior stents in mid RCA and PL branch are widely patent. New stenosis at proximal stent margin in the mid RCA Normal LV function Normal LVEDP Successful PCI of the mid RCA with DES overlapping with old stent   Plan: DAPT for one year. Anticipate DC  in am.  Diagnostic Dominance: Right  Intervention   _____________   History of Present Illness   Louis Byrd is a 48 y.o. male with past medical history of CAD s/p CABGx5 in 2019, PTCA/DES x 2 mRCA and x 1 RPAV, hypertension, type I DM, hypothyroidism, seizures, alcohol abuse who was seen 03/10/2024 for the evaluation of unstable angina.   Per chart review, patient previously had CABG x 5 in 2019 with LIMA-LAD, SVG-diagonal, radial artery graft-OM, SVG-PDA and PLA.    Later admitted in 03/2023 with NSTEMI.  At that time, patient had been admitted to [facility] for management of DKA.  Troponin 2277>2262.  Echocardiogram showed EF 55-60%, no regional wall motion abnormalities.  Patient was transferred to [facility] and underwent left heart catheterization on 03/21/2023.  Found to have 2/5 patent grafts (LIMA-LAD and radial artery graft-OM).  SVG to diagonal, SVG-PDA and PLA occluded.  The RCA was unprotected, underwent successful PTCA/DES x2 to the mid RCA. Also underwent successful PTCA/DES x1 to the posterolateral artery.  Recommended DAPT with aspirin  and effient  for at least 1 year.   Patient was last seen by cardiology on 03/30/2023.  At that time, he continued to have some atypical chest pain.  Started on Imdur .  Remained on Crestor .  Patient was committed to changing his lifestyle, specifically alcohol cessation.  He was on gabapentin  to help with alcohol abuse  Patient presented to [facility] ED on 7/27 complaining of substernal chest pain.  Improved with sublingual nitroglycerin .  Reported that chest discomfort was similar to last year when he had his NSTEMI.   Labs in the ED significant for - Na 137 - K 4.9 - Creatinine 0.70  - AST 28, ALT 19 - WBC 7.1 - Hemoglobin 15 - Troponin I negative x3 - pro BNP 35    CXR showed no acute findings. Initially planned to pursue stress test and echocardiogram. However, patient continued to have chest pain into 7/28. Decided to  proceed with cardiac catheterization. Patient transferred to [facility] and admitted to the cardiology service.   Hospital Course    Unstable angina CAD s/p CABG (LIMA-LAD, SVG-diagonal, radial artery graft-OM, SVG-PDA and PLA) -- Presented to [facility] with unstable angina symptoms felt similar to her previous NSTEMI.  Troponin negative x 3 and EKG nonacute.  Initially was plan for echo and stress test but continued to have chest pain therefore was transferred to [facility] for further evaluation with cardiac catheterization. -- Underwent cardiac catheterization 7/28 with patent LIMA to LAD, free radial graft to OM, chronically occluded SVG to diagonal and SVG to RCA, prior stents in mid RCA and PLA branch widely patent with new stenosis proximal to stent margin in the mid RCA treated with PCI/DES overlapping and old stent.  Recommendations for DAPT with aspirin /Effient  for at least 1 year.  LVEF normal on LV gram. -- Continue aspirin , Effient , Crestor  40 mg daily, lisinopril  2 mg daily -- CR to see  Hyperlipidemia -- Lipid panel 06/2023 with LDL 117 -- Add Crestor  40 mg daily -- Will need outpatient LFT/FLP in 8 weeks  Type 1 diabetes -- Resume home insulin  regimen  History of EtOH -- reports he has quit   General: Well developed, well nourished, male appearing in no acute distress. Head: Normocephalic, atraumatic.  Neck: Supple without bruits, JVD. Lungs:  Resp regular and unlabored, CTA. Heart: RRR, S1, S2, no S3, S4, or murmur; no rub. Abdomen: Soft, non-tender, non-distended with normoactive bowel sounds.  Extremities: No clubbing, cyanosis, edema. Distal pedal pulses are 2+ bilaterally. Right femoral cath site stable without bruising or hematoma Neuro: Alert and oriented X 3. Moves all extremities spontaneously. Psych: Normal affect.  Seen by myself and Dr. Jackelyn deemed stable for discharge home.  Follow-up arranged in the office.  Medication sent to [facility] pharmacy.  Educated  by [employer].D. prior to discharge.  Did the patient have an acute coronary syndrome (MI, NSTEMI, STEMI, etc) this admission?:  Yes                               AHA/ACC ACS Clinical Performance & Quality Measures: Aspirin  prescribed? - Yes ADP Receptor Inhibitor (Plavix/Clopidogrel, Brilinta /Ticagrelor  or Effient /Prasugrel ) prescribed (includes medically managed patients)? - Yes Beta Blocker prescribed? - No. The patient has an EF >/= 50%. Based upon the [facility] Study pub in Apr 2024, there is no benefit in patients with preserved EF post MI. High Intensity Statin (Lipitor 40-80mg  or Crestor  20-40mg ) prescribed? - Yes EF assessed during THIS hospitalization? - No - Outpatient Echocardiogram will be scheduled to assess EF. For EF <40%, was ACEI/ARB prescribed? - Not Applicable (EF >/= 40%) For EF <40%, Aldosterone Antagonist (Spironolactone or Eplerenone) prescribed? - Not Applicable (EF >/= 40%) Cardiac Rehab Phase II ordered (including medically managed patients)? - Yes   The patient will be scheduled for a  TOC follow up appointment in 10-14 days.  A message has been sent to the [facility] and Scheduling Pool at the office where the patient should be seen for follow up.  _____________  Discharge Vitals Blood pressure 115/72, pulse 81, temperature 98 F (36.7 C), temperature source Oral, resp. rate 20, height 5' 9 (1.753 m), weight 82.6 kg, SpO2 97%.  Filed Weights   03/10/24 1500  Weight: 82.6 kg    Labs & Radiologic Studies  CBC Recent Labs    03/11/24 0038 03/11/24 0424  WBC 16.1* 14.0*  HGB 13.9 13.4  HCT 41.3 40.2  MCV 84.5 84.5  PLT 258 258   Basic Metabolic Panel Recent Labs    92/70/74 0038 03/11/24 0424  NA  --  139  K  --  4.2  CL  --  105  CO2  --  21*  GLUCOSE  --  234*  BUN  --  14  CREATININE 1.06 1.03  CALCIUM   --  9.1   Liver Function Tests No results for input(s): AST, ALT, ALKPHOS, BILITOT, PROT, ALBUMIN  in the last 72 hours. No  results for input(s): LIPASE, AMYLASE in the last 72 hours. High Sensitivity Troponin:   No results for input(s): TROPONINIHS in the last 720 hours.  No results for input(s): TRNPT in the last 720 hours.  BNP Invalid input(s): POCBNP No results for input(s): PROBNP in the last 72 hours.  No results for input(s): BNP in the last 72 hours.  D-Dimer No results for input(s): DDIMER in the last 72 hours. Hemoglobin A1C Recent Labs    03/11/24 0038  HGBA1C 7.4*   Fasting Lipid Panel No results for input(s): CHOL, HDL, LDLCALC, TRIG, CHOLHDL, LDLDIRECT in the last 72 hours. No results found for: LIPOA  Thyroid  Function Tests No results for input(s): TSH, T4TOTAL, T3FREE, THYROIDAB in the last 72 hours.  Invalid input(s): FREET3 _____________  CARDIAC CATHETERIZATION Result Date: 03/10/2024   Mid LAD lesion is 100% stenosed.   Prox Cx lesion is 90% stenosed.   Mid Cx lesion is 90% stenosed.   Origin to Prox Graft lesion before 3rd RPL  is 100% stenosed.   Origin to Prox Graft lesion is 100% stenosed.   1st Diag lesion is 100% stenosed.   RPAV-1 lesion is 50% stenosed.   RPDA lesion is 40% stenosed.   Prox RCA lesion is 85% stenosed.   Non-stenotic Mid RCA lesion was previously treated.   Non-stenotic RPAV-2 lesion was previously treated.   A drug-eluting stent was successfully placed using a STENT SYNERGY XD 3.50X12.   Post intervention, there is a 0% residual stenosis.   SVG.   Left radial artery and is normal in caliber.   LIMA and is normal in caliber.   SVG.   The left ventricular systolic function is normal.   LV end diastolic pressure is normal.   The left ventricular ejection fraction is 55-65% by visual estimate.   Recommend uninterrupted dual antiplatelet therapy with Aspirin  81mg  daily and Prasugrel  10mg  daily for a minimum of 12 months (ACS-Class I recommendation). Severe 3 vessel obstructive CAD Patent LIMA to the LAD. Patent free radial artery  graft to OM Chronically occluded SVG to diagonal and SVG to RCA Prior stents in mid RCA and PL branch are widely patent. New stenosis at proximal stent margin in the mid RCA Normal LV function Normal LVEDP Successful PCI of the mid RCA with DES overlapping with old stent Plan: DAPT for one year. Anticipate  DC in am.    Disposition Pt is being discharged home today in good condition.  Follow-up Plans & Appointments  Discharge Instructions     AMB Referral to Cardiac Rehabilitation - Phase II   Complete by: As directed    Diagnosis: Coronary Stents   After initial evaluation and assessments completed: Virtual Based Care may be provided alone or in conjunction with Phase 2 Cardiac Rehab based on patient barriers.: Yes   Intensive Cardiac Rehabilitation (ICR) [facility] location only OR Traditional Cardiac Rehabilitation (TCR) *If criteria for ICR are not met will enroll in TCR [facility] only): Yes   Call MD for:  difficulty breathing, headache or visual disturbances   Complete by: As directed    Call MD for:  persistant dizziness or light-headedness   Complete by: As directed    Call MD for:  redness, tenderness, or signs of infection (pain, swelling, redness, odor or green/yellow discharge around incision site)   Complete by: As directed    Diet - low sodium heart healthy   Complete by: As directed    Discharge instructions   Complete by: As directed    Groin Site Care Refer to this sheet in the next few weeks. These instructions provide you with information on caring for yourself after your procedure. Your caregiver may also give you more specific instructions. Your treatment has been planned according to current medical practices, but problems sometimes occur. Call your caregiver if you have any problems or questions after your procedure. HOME CARE INSTRUCTIONS You may shower 24 hours after the procedure. Remove the bandage (dressing) and gently wash the site with plain soap and water. Gently pat the  site dry.  Do not apply powder or lotion to the site.  Do not sit in a bathtub, swimming pool, or whirlpool for 5 to 7 days.  No bending, squatting, or lifting anything over 10 pounds (4.5 kg) as directed by your caregiver.  Inspect the site at least twice daily.  Do not drive home if you are discharged the same day of the procedure. Have someone else drive you.  You may drive 24 hours after the procedure unless otherwise instructed by your caregiver.  What to expect: Any bruising will usually fade within 1 to 2 weeks.  Blood that collects in the tissue (hematoma) may be painful to the touch. It should usually decrease in size and tenderness within 1 to 2 weeks.  SEEK IMMEDIATE MEDICAL CARE IF: You have unusual pain at the groin site or down the affected leg.  You have redness, warmth, swelling, or pain at the groin site.  You have drainage (other than a small amount of blood on the dressing).  You have chills.  You have a fever or persistent symptoms for more than 72 hours.  You have a fever and your symptoms suddenly get worse.  Your leg becomes pale, cool, tingly, or numb.  You have heavy bleeding from the site. Hold pressure on the site. SABRA  PLEASE DO NOT MISS ANY DOSES OF YOUR EFFIENT !!!!! Also keep a log of you blood pressures and bring back to your follow up appt. Please call the office with any questions.   Patients taking blood thinners should generally stay away from medicines like ibuprofen, Advil, Motrin, naproxen, and Aleve due to risk of stomach bleeding. You may take Tylenol  as directed or talk to your primary doctor about alternatives.   PLEASE ENSURE THAT YOU DO NOT RUN OUT OF YOUR EFFIENT . This medication is  very important to remain on for at least one year. IF you have issues obtaining this medication due to cost please CALL the office 3-5 business days prior to running out in order to prevent missing doses of this medication.   Increase activity slowly   Complete by: As  directed        Discharge Medications Allergies as of 03/11/2024       Reactions   Morphine  Nausea And Vomiting        Medication List     STOP taking these medications    naproxen sodium 220 MG tablet Commonly known as: ALEVE       TAKE these medications    aspirin  EC 81 MG tablet Take 1 tablet (81 mg total) by mouth daily. Swallow whole. What changed: additional instructions   Dexcom G6 Sensor Misc Use as directed for 30 days What changed:  how much to take how to take this when to take this   fexofenadine  180 MG tablet Commonly known as: ALLEGRA  Take 1 tablet (180 mg total) by mouth daily as needed for allergies.   fluticasone  50 MCG/ACT nasal spray Commonly known as: FLONASE  Place 1 spray into both nostrils daily as needed for allergies.   Insulin  lispro 100 UNIT/ML Commonly known as: HUMALOG  Junior KwikPen Inject 0-10 Units into the skin as needed (for high blood sugar). Sliding scale   levothyroxine  150 MCG tablet Commonly known as: SYNTHROID  TAKE 1 TABLET BY MOUTH EVERY DAY   lisinopril  10 MG tablet Commonly known as: ZESTRIL  TAKE 1 TABLET BY MOUTH EVERY DAY   nitroGLYCERIN  0.4 MG SL tablet Commonly known as: NITROSTAT  Place 1 tablet (0.4 mg total) under the tongue every 5 (five) minutes as needed for chest pain.   NovoLIN N 100 UNIT/ML injection Generic drug: insulin  NPH Human Inject 36 Units into the skin daily before breakfast.   Omnipod 5 DexG7G6 Pods Gen 5 Misc Inject 1.3 units as needed, changing every 3 days What changed:  how much to take how to take this when to take this   prasugrel  10 MG Tabs tablet Commonly known as: EFFIENT  Take 10 mg by mouth daily.   rosuvastatin  40 MG tablet Commonly known as: CRESTOR  Take 1 tablet (40 mg total) by mouth daily.   sertraline  50 MG tablet Commonly known as: ZOLOFT  TAKE 1 TABLET BY MOUTH EVERY DAY         Outstanding Labs/Studies  FLP/LFTs in 8 weeks   Duration of  Discharge Encounter: APP Time: 20 minutes   Signed, Manuelita Rummer, NP 03/11/2024, 9:21 AM

## 2024-03-11 NOTE — Telephone Encounter (Signed)
   Transition of Care Follow-up Phone Call Request    Patient Name: Louis Byrd Date of Birth: 1975/08/28 Date of Encounter: 03/11/2024  Primary Care Provider:  Amin, Saad, MD Primary Cardiologist:  Lamar Fitch, MD  Louis Byrd has been scheduled for a transition of care follow up appointment with a [facility] provider:  Delon Hoover 8/13  Please reach out to Louis Byrd within 48 hours of discharge to confirm appointment and review transition of care protocol questionnaire. Anticipated discharge date: 7/29  Manuelita Rummer, NP  03/11/2024, 9:20 AM

## 2024-03-11 NOTE — Inpatient Diabetes Management (Signed)
 Inpatient Diabetes Program Recommendations  AACE/ADA: New Consensus Statement on Inpatient Glycemic Control (2015)  Target Ranges:  Prepandial:   less than 140 mg/dL      Peak postprandial:   less than 180 mg/dL (1-2 hours)      Critically ill patients:  140 - 180 mg/dL    Latest Reference Range & Units 03/10/24 16:01 03/10/24 19:11 03/11/24 00:09 03/11/24 07:33  Glucose-Capillary 70 - 99 mg/dL 717 (H) 756 (H) 784 (H) 253 (H)   Review of Glycemic Control  Diabetes history: DM Outpatient Diabetes medications: NPH 36 units QAM, Humalog  5 units TID with meals Current orders for Inpatient glycemic control: NPH 36 units QAM, Novolog  5 units TID with meals  Inpatient Diabetes Program Recommendations:    Insulin : Please consider adding Novolog  0-9 units TID with meals.  NOTE: Patient noted to have hx of Type 1 DM and has Humalog  and Omnipod listed on current med list in chart. Per note by H. Meng, [place] on 03/10/24 patient reported he was not using an insulin  pump and was taking NPH 36 units QAM and Humalog  5 units TID with meals. No insulin  given since arrival to hospital on 03/10/24 and CBGs 215-282 mg/dl over past 16 hours; labs this morning at 4:24 with glucose of 234 and CO2 21.   Thanks, Earnie Gainer, RN, MSN, CDCES Diabetes Coordinator Inpatient Diabetes Program [phone] (Team Pager from 8am to 5pm)

## 2024-03-11 NOTE — Telephone Encounter (Signed)
 Left message for the patient to call back.

## 2024-03-11 NOTE — Progress Notes (Signed)
 Cardiac rehab came by to discuss with patient PCI & DAPT, restrictions, exercise guidelines, HH/DM nutrition, angina/nitroglycerin  use, and CRPII. Stent card was given to pt along with MI book. Pt understood education and had no questions. CRPII referral was placed for [facility].

## 2024-03-11 NOTE — Progress Notes (Signed)
 AVS and discharge instructions given Patient verbally understands all instructions.  PIVs removed dressings intact.  Patient verbally understands. Groin dressing and care at home.  Transport to main entrance with [facility] pharmacy.

## 2024-03-12 LAB — LIPOPROTEIN A (LPA): Lipoprotein (a): 28.5 nmol/L (ref <75.0)

## 2024-03-12 NOTE — Telephone Encounter (Signed)
 Patient contacted regarding discharge from [facility] on 03/11/24.  Patient understands to follow up with provider Delon Hoover, NP on 03/26/24 at 10:05 am at the [facility] office location. Patient understands discharge instructions? yes Patient understands medications and regiment? yes Patient understands to bring all medications to this visit? yes  Patient stated that he lost his insurance and is requesting to know how much the office visit will cost. Will forward to billing to find out the cost of the office visit for the patient.

## 2024-03-13 ENCOUNTER — Telehealth: Payer: Self-pay

## 2024-03-13 NOTE — Telephone Encounter (Signed)
 Called the patient and he reported that the roof of his mouth is very raw and his teeth and sinuses feel like they are raw and there is a lot of pressure built up. He was just in the hospital and had a cardiac stent placed less than a week ago.Please advise.

## 2024-03-14 NOTE — Telephone Encounter (Signed)
 Called the patient to inform him of Dr. Karry recommendation below:  I do not think that any cardiac you need to see your primary care physician or maybe dentist  Patient verbalized understanding and stated that he ended up going to the ER last night and was diagnosed with TNJ. They prescribed him medication to take and he is feeling much better now. Patient had no further questions at this time.

## 2024-03-17 NOTE — Progress Notes (Signed)
 Cardiology Office Note:  .   Date:  03/19/2024  ID:  Dasie CHRISTELLA Smalling, DOB [birth year], MRN [id number] PCP: Caleen Dirks, MD  [facility] Providers Cardiologist:  Lamar Fitch, MD    History of Present Illness: .   Louis Byrd is a 48 y.o. male with a past medical history of CAD s/p CABGx5 2019, hypertension, DM 1, hypothyroidism, seizures, alcohol abuse.  03/10/2024 left heart cath DES to proximal RCA 03/21/2019 for left heart cath 2/5 patent bypass grafts, PTCA/DES to mid RCA & DES RPAV 2019 CABG x 5  Evaluated by Dr. Fitch on 03/30/2020, he had some angina and a repeat Lexiscan  was ordered however does not appear this was ever completed.  It appears he was lost to follow-up after this.  Admitted to [facility] as a transfer from [facility] for evaluation of non-STEMI, he had been admitted to [facility] for management of DKA.  Troponin 2277>>2262.  Echocardiogram revealed an EF 55 to 60%, no RWMA. LHC on 03/21/23 >> 2/5 patent grafts (LIMA-LAD and SVG-OM) PCI/DES x 2 to mRCA, DES x1 to RPAV. Recommendations for DAPT with ASA/Effient  for at least one year per cardiology.   He presented to [facility] on 03/09/2024 complaining of substernal chest pain which had improved with nitroglycerin , symptoms were consistent to his prior non-STEMI.  Troponins were negative x 3, initial plans were to pursue stress evaluation and echo however he continued to have chest pain and was subsequently transferred to [facility] where he underwent left heart catheterization revealing new lesion at the proximal edge of the mid RCA successfully treated with DES overlapping of old stent.  He presents today for follow-up after his most recent left heart cath.  He is feeling actually quite poorly, since he was discharged he developing severe facial pain, was evaluated at [facility] where workup was unrevealing and he was advised this is likely trigeminal neuralgia--he was started on  carbamazepine as well as a steroid taper.  He is not having any episodes of chest pain he just feels terrible in general, the pain is quite unbearable for him.  He is currently uninsured so it is difficult for him to arrange for office co-pays. He denies chest pain, palpitations, dyspnea, pnd, orthopnea, n, v, dizziness, syncope, edema, weight gain, or early satiety.    ROS: Review of Systems  Constitutional:  Positive for malaise/fatigue.  Neurological:        Facial and oral pain  All other systems reviewed and are negative.    Studies Reviewed: .        Cardiac Studies & Procedures   ______________________________________________________________________________________________ CARDIAC CATHETERIZATION  CARDIAC CATHETERIZATION 03/10/2024  Conclusion   Mid LAD lesion is 100% stenosed.   Prox Cx lesion is 90% stenosed.   Mid Cx lesion is 90% stenosed.   Origin to Prox Graft lesion before 3rd RPL  is 100% stenosed.   Origin to Prox Graft lesion is 100% stenosed.   1st Diag lesion is 100% stenosed.   RPAV-1 lesion is 50% stenosed.   RPDA lesion is 40% stenosed.   Prox RCA lesion is 85% stenosed.   Non-stenotic Mid RCA lesion was previously treated.   Non-stenotic RPAV-2 lesion was previously treated.   A drug-eluting stent was successfully placed using a STENT SYNERGY XD 3.50X12.   Post intervention, there is a 0% residual stenosis.   SVG.   Left radial artery and is normal in caliber.   LIMA and is normal  in caliber.   SVG.   The left ventricular systolic function is normal.   LV end diastolic pressure is normal.   The left ventricular ejection fraction is 55-65% by visual estimate.   Recommend uninterrupted dual antiplatelet therapy with Aspirin  81mg  daily and Prasugrel  10mg  daily for a minimum of 12 months (ACS-Class I recommendation).  Severe 3 vessel obstructive CAD Patent LIMA to the LAD. Patent free radial artery graft to OM Chronically occluded SVG to diagonal and SVG  to RCA Prior stents in mid RCA and PL branch are widely patent. New stenosis at proximal stent margin in the mid RCA Normal LV function Normal LVEDP Successful PCI of the mid RCA with DES overlapping with old stent  Plan: DAPT for one year. Anticipate DC in am.  Findings Coronary Findings Diagnostic  Dominance: Right  Left Anterior Descending Vessel is large. Mid LAD lesion is 100% stenosed. The lesion is chronically occluded.  First Diagonal Branch Collaterals 1st Diag filled by collaterals from 3rd Diag.  1st Diag lesion is 100% stenosed. The lesion is chronically occluded.  Left Circumflex Vessel is large. Prox Cx lesion is 90% stenosed. Mid Cx lesion is 90% stenosed.  Right Coronary Artery Vessel is large. Prox RCA lesion is 85% stenosed. The lesion is eccentric. Non-stenotic Mid RCA lesion was previously treated.  Right Posterior Descending Artery RPDA lesion is 40% stenosed.  Right Posterior Atrioventricular Artery RPAV-1 lesion is 50% stenosed. Non-stenotic RPAV-2 lesion was previously treated.  Sequential Saphenous Graft To 3rd RPL, RPDA SVG. Origin to Prox Graft lesion before 3rd RPL  is 100% stenosed. The lesion is chronically occluded.  Left Radial Artery Graft To 2nd Mrg Left radial artery and is normal in caliber.  LIMA LIMA Graft To Dist LAD LIMA and is normal in caliber.  Saphenous Graft To 1st Diag SVG. Origin to Prox Graft lesion is 100% stenosed. The lesion is chronically occluded.  Intervention  Prox RCA lesion Stent CATH VISTA GUIDE 6FR JR4 ECOPK guide catheter was inserted. Lesion crossed with guidewire using a WIRE ASAHI PROWATER 180CM. Pre-stent angioplasty was performed using a BALLOON EMERGE MR 2.5X12. A drug-eluting stent was successfully placed using a STENT SYNERGY XD 3.50X12. Stent strut is well apposed. Stent overlaps previously placed stent. Post-stent angioplasty was performed using a BALLOON  EMERGE MR 3.75X8. Maximum  pressure:  16 atm. Post-Intervention Lesion Assessment The intervention was successful. Pre-interventional TIMI flow is 3. Post-intervention TIMI flow is 3. No complications occurred at this lesion. There is a 0% residual stenosis post intervention.   CARDIAC CATHETERIZATION  CARDIAC CATHETERIZATION 03/21/2023  Conclusion   RPAV-2 lesion is 99% stenosed.   RPAV-1 lesion is 50% stenosed.   RPDA lesion is 40% stenosed.   Mid RCA lesion is 90% stenosed.   Origin to Prox Graft lesion before 3rd RPL  is 100% stenosed.   Prox Cx lesion is 90% stenosed.   Mid Cx lesion is 90% stenosed.   Mid LAD lesion is 100% stenosed.   1st Diag lesion is 100% stenosed.   Origin to Prox Graft lesion is 100% stenosed.   A drug-eluting stent was successfully placed using a STENT ONYX FRONTIER 2.25X12.   A drug-eluting stent was successfully placed using a STENT ONYX FRONTIER 3.0X38.   Post intervention, there is a 0% residual stenosis.   Post intervention, there is a 0% residual stenosis.   SVG graft was visualized by angiography.   LIMA graft was visualized by angiography and is normal in caliber.  SVG graft was visualized by angiography.   Left radial artery graft was visualized by angiography and is normal in caliber.   The left ventricular systolic function is normal.   LV end diastolic pressure is normal.   The left ventricular ejection fraction is greater than 65% by visual estimate.  Triple vessel CAD s/p 5V CABG with 2/5 patent bypass grafts Chronic occlusion of the mid LAD. Patent LIMA to LAD. Occluded SVG to Diagonal. The diagonal fills from left to left collaterals. The Circumflex has severe proximal and mid stenosis. The radial artery graft to the obtuse marginal branch is patent The dominant RCA has severe mid vessel disease and severe disease in the posterolateral artery. The Sequential SVG to PDA and PLA is occluded The RCA is now unprotected. Successful PTCA/DES x 2 mid RCA. Successful  PTCA/DES x 1 posterolateral artery  Recommendations: DAPT with ASA and Brilinta  for one year.  Findings Coronary Findings Diagnostic  Dominance: Right  Left Anterior Descending Vessel is large. Mid LAD lesion is 100% stenosed. The lesion is chronically occluded.  First Diagonal Branch Collaterals 1st Diag filled by collaterals from 3rd Diag.  1st Diag lesion is 100% stenosed. The lesion is chronically occluded.  Left Circumflex Vessel is large. Prox Cx lesion is 90% stenosed. Mid Cx lesion is 90% stenosed.  Right Coronary Artery Vessel is large. Mid RCA lesion is 90% stenosed.  Right Posterior Descending Artery RPDA lesion is 40% stenosed.  Right Posterior Atrioventricular Artery RPAV-1 lesion is 50% stenosed. RPAV-2 lesion is 99% stenosed.  Sequential Saphenous Graft To 3rd RPL, RPDA SVG graft was visualized by angiography. Origin to Prox Graft lesion before 3rd RPL  is 100% stenosed. The lesion is chronically occluded.  Left Radial Artery Graft To 2nd Mrg Left radial artery graft was visualized by angiography and is normal in caliber.  LIMA LIMA Graft To Dist LAD LIMA graft was visualized by angiography and is normal in caliber.  Saphenous Graft To 1st Diag SVG graft was visualized by angiography. Origin to Prox Graft lesion is 100% stenosed. The lesion is chronically occluded.  Intervention  Mid RCA lesion Stent CATH LAUNCHER 6FR JR4 guide catheter was inserted. Lesion crossed with guidewire using a WIRE COUGAR XT STRL 190CM. Pre-stent angioplasty was performed using a BALLN SAPPHIRE 2.0X12. A drug-eluting stent was successfully placed using a STENT ONYX FRONTIER 3.0X38. Stent strut is well apposed. Post-stent angioplasty was performed using a BALLN [place] EMERGE MR 3.25X20. Post-Intervention Lesion Assessment The intervention was successful. Pre-interventional TIMI flow is 3. Post-intervention TIMI flow is 3. No complications occurred at this lesion. There is a  0% residual stenosis post intervention.  RPAV-2 lesion Stent CATH LAUNCHER 6FR JR4 guide catheter was inserted. Lesion crossed with guidewire using a WIRE COUGAR XT STRL 190CM. Pre-stent angioplasty was performed using a BALLN SAPPHIRE 2.0X12. A drug-eluting stent was successfully placed using a STENT ONYX FRONTIER 2.25X12. Stent strut is well apposed. Post-stent angioplasty was not performed. Post-Intervention Lesion Assessment The intervention was successful. Pre-interventional TIMI flow is 3. Post-intervention TIMI flow is 3. No complications occurred at this lesion. There is a 0% residual stenosis post intervention.     ECHOCARDIOGRAM  ECHOCARDIOGRAM COMPLETE 05/06/2019  Narrative ECHOCARDIOGRAM REPORT    Patient Name:   LENNIS KORB Date of Exam: 05/06/2019 Medical Rec #:  [id number]     Height:       70.0 in Accession #:    [id number]    Weight:  183.4 lb Date of Birth:  [birth year]      BSA:          2.01 m Patient Age:    43 years      BP:           114/64 mmHg Patient Gender: M             HR:           90 bpm. Exam Location:  [place]  Procedure: 2D Echo  Indications:    Essential hypertension [I10]  History:        Patient has prior history of Echocardiogram examinations, most recent 09/26/2017. CAD; Prior CABG Risk Factors:Hypertension and Dyslipidemia.  Sonographer:    Lynwood Silvas Referring Phys: [phone] ROBERT J KRASOWSKI  IMPRESSIONS   1. Left ventricular ejection fraction, by visual estimation, is 55 to 60%. The left ventricle has normal function. Normal left ventricular size. There is no left ventricular hypertrophy. 2. Global right ventricle looks normal visually (despite the low TASPE).The right ventricular size is normal. No increase in right ventricular wall thickness. 3. Left atrial size was normal. 4. Right atrial size was normal. 5. The mitral valve is normal in structure. Trace mitral valve regurgitation. No evidence of mitral stenosis. 6. The  tricuspid valve is normal in structure. Tricuspid valve regurgitation was not visualized by color flow Doppler. 7. The aortic valve is normal in structure. Aortic valve regurgitation was not visualized by color flow Doppler. Structurally normal aortic valve, with no evidence of sclerosis or stenosis. 8. The pulmonic valve was normal in structure. Pulmonic valve regurgitation is not visualized by color flow Doppler. 9. Normal pulmonary artery systolic pressure. 10. The inferior vena cava is normal in size with greater than 50% respiratory variability, suggesting right atrial pressure of 3 mmHg.  FINDINGS Left Ventricle: Left ventricular ejection fraction, by visual estimation, is 55 to 60%. The left ventricle has normal function. There is no left ventricular hypertrophy. Normal left ventricular size.  Right Ventricle: The right ventricular size is normal. No increase in right ventricular wall thickness. Global RV systolic function is looks normal visually (despite the low TASPE). The tricuspid regurgitant velocity is 2.22 m/s, and with an assumed right atrial pressure of 3 mmHg, the estimated right ventricular systolic pressure is normal at 22.7 mmHg.  Left Atrium: Left atrial size was normal in size.  Right Atrium: Right atrial size was normal in size  Pericardium: There is no evidence of pericardial effusion.  Mitral Valve: The mitral valve is normal in structure. No evidence of mitral valve stenosis by observation. Trace mitral valve regurgitation.  Tricuspid Valve: The tricuspid valve is normal in structure. Tricuspid valve regurgitation was not visualized by color flow Doppler.  Aortic Valve: The aortic valve is normal in structure. Aortic valve regurgitation was not visualized by color flow Doppler. The aortic valve is structurally normal, with no evidence of sclerosis or stenosis.  Pulmonic Valve: The pulmonic valve was normal in structure. Pulmonic valve regurgitation is not visualized  by color flow Doppler.  Aorta: The aortic root, ascending aorta and aortic arch are all structurally normal, with no evidence of dilitation or obstruction.  Venous: The inferior vena cava is normal in size with greater than 50% respiratory variability, suggesting right atrial pressure of 3 mmHg.  IAS/Shunts: No atrial level shunt detected by color flow Doppler. No ventricular septal defect is seen or detected. There is no evidence of an atrial septal defect.    LEFT VENTRICLE  Normals PLAX 2D LVIDd:         4.90 cm  3.6 cm       Diastology                  Normals LVIDs:         3.40 cm  1.7 cm       LV e' lateral:   11.00 cm/s 6.42 cm/s LV PW:         1.00 cm  1.4 cm       LV E/e' lateral: 8.0        15.4 LV IVS:        1.00 cm  1.3 cm       LV e' medial:    7.29 cm/s  6.96 cm/s LVOT diam:     2.10 cm  2.0 cm       LV E/e' medial:  12.0       6.96 LV SV:         65 ml    79 ml LV SV Index:   32.06    45 ml/m2 LVOT Area:     3.46 cm 3.14 cm2  LV Volumes (MOD)             Normals LV area d, A2C:    23.10 cm LV area d, A4C:    26.10 cm LV area s, A2C:    15.20 cm LV area s, A4C:    16.20 cm LV major d, A2C:   7.77 cm LV major d, A4C:   8.27 cm LV major s, A2C:   7.01 cm LV major s, A4C:   6.98 cm LV vol d, MOD A2C: 57.7 ml   68 ml LV vol d, MOD A4C: 68.6 ml LV vol s, MOD A2C: 28.5 ml   24 ml LV vol s, MOD A4C: 32.0 ml LV SV MOD A2C:     29.2 ml LV SV MOD A4C:     68.6 ml LV SV MOD BP:      34.4 ml   45 ml  RIGHT VENTRICLE         IVC TAPSE (M-mode): 1.5 cm  IVC diam: 1.90 cm  LEFT ATRIUM           Index       RIGHT ATRIUM           Index LA diam:      3.40 cm 1.69 cm/m  RA Area:     12.70 cm LA Vol (A2C): 47.4 ml 23.56 ml/m RA Volume:   30.40 ml  15.11 ml/m LA Vol (A4C): 41.0 ml 20.38 ml/m AORTIC VALVE             Normals LVOT Vmax:   118.00 cm/s LVOT Vmean:  77.100 cm/s 75 cm/s LVOT VTI:    0.208 m     25.3 cm  AORTA                 Normals Ao Root  diam: 3.10 cm 31 mm Ao Asc diam:  3.40 cm 31 mm  MITRAL VALVE              Normals   TRICUSPID VALVE             Normals MV Area (PHT): 3.48 cm             TR Peak grad:   19.7 mmHg MV PHT:        63.22 msec  55 ms     TR Vmax:        225.00 cm/s 288 cm/s MV Decel Time: 218 msec   187 ms MV E velocity: 87.80 cm/s 103 cm/s  SHUNTS MV A velocity: 59.20 cm/s 70.3 cm/s Systemic VTI:  0.21 m MV E/A ratio:  1.48       1.5       Systemic Diam: 2.10 cm   Kardie Tobb DO Electronically signed by Dub Huntsman DO Signature Date/Time: 05/06/2019/2:50:07 PM    Final   TEE  ECHO TEE 10/01/2017  Interpretation Summary  Aortic valve: The valve is trileaflet. No stenosis. No regurgitation.  Right ventricle: Normal cavity size, wall thickness and ejection fraction.  Tricuspid valve: Mild regurgitation. The tricuspid valve regurgitation jet is central.        ______________________________________________________________________________________________      Risk Assessment/Calculations:             Physical Exam:   VS:  BP 138/80   Pulse 95   Ht 5' 9 (1.753 m)   Wt 183 lb (83 kg)   SpO2 98%   BMI 27.02 kg/m    Wt Readings from Last 3 Encounters:  03/18/24 183 lb (83 kg)  03/10/24 181 lb 15.8 oz (82.6 kg)  10/12/23 188 lb 4 oz (85.4 kg)    GEN: Well nourished, well developed in no acute distress NECK: No JVD; No carotid bruits CARDIAC: RRR, no murmurs, rubs, gallops RESPIRATORY:  Clear to auscultation without rales, wheezing or rhonchi  ABDOMEN: Soft, non-tender, non-distended EXTREMITIES:  No edema; No deformity. Right femoral artery site is healing well, palpable SKIN: right groin cath site, healing well, mild ecchymosis   ASSESSMENT AND PLAN: .   Coronary artery disease-s/p CABG x 5 in 2019>> DES to proximal RCA 03/10/24. Continue DAPT x 12 months with aspirin  and Effient .  Crestor  was increased from 10 mg daily to 40 mg daily at hospital discharge, patient wonders if this  is causing him to feel so poorly as he was unable to tolerate higher doses in the past, I do not think this is the sole cause of his fatigue as he is also dealing with possible trigeminal neuralgia but advised him to take it every other day to see if this helps with his symptoms for a few weeks, if not then return to take daily.  Trigeminal neuralgia-evaluated in the emergency department with this is the most likely diagnosis however he has not followed up with his PCP yet.  He was given a steroid taper and started on carbamazepine and his pain is somewhat better but is still relatively uncontrolled.  I did advise him to follow-up with his PCP today I do not think he can wait another month until he sees him with the current pain medicine.  Hyperlipidemia-most recent LDL was uncontrolled at 117; LP(a) is 28.5, his dose of Crestor  was increased to 40 mg at discharge--will repeat FLP and CMET in 6 weeks--he thinks he is having some side effects related to dose increase but I think this is most likely related to his suspected trigeminal neuralgia and is overall feeling quite poorly from that.  I did tell him he could try taking it every other day however if his symptoms did not improve did not continue taking it daily.  Hypertension-blood pressure is slightly elevated today at 138/80 however he is also in considerable pain, continue lisinopril  10 mg daily.  Alcohol abuse-complete abstinence x 1 year.  Congratulated him on  this tremendous complement.  DM 1 -managed by his PCP, most recent A1c was controlled at 7.4%.        Dispo: In 6 weeks FLP, CMET--he is uninsured so I advised him to request to see myself or Dr. Bernie when he comes in for lab work so we can see how he is feeling.   Signed, Delon JAYSON Hoover, NP

## 2024-03-18 ENCOUNTER — Telehealth (HOSPITAL_COMMUNITY): Payer: Self-pay

## 2024-03-18 ENCOUNTER — Ambulatory Visit: Payer: Self-pay | Attending: Cardiology | Admitting: Cardiology

## 2024-03-18 ENCOUNTER — Encounter: Payer: Self-pay | Admitting: Cardiology

## 2024-03-18 VITALS — BP 138/80 | HR 95 | Ht 69.0 in | Wt 183.0 lb

## 2024-03-18 DIAGNOSIS — E782 Mixed hyperlipidemia: Secondary | ICD-10-CM

## 2024-03-18 DIAGNOSIS — E1059 Type 1 diabetes mellitus with other circulatory complications: Secondary | ICD-10-CM

## 2024-03-18 DIAGNOSIS — G500 Trigeminal neuralgia: Secondary | ICD-10-CM

## 2024-03-18 DIAGNOSIS — E785 Hyperlipidemia, unspecified: Secondary | ICD-10-CM

## 2024-03-18 DIAGNOSIS — I25709 Atherosclerosis of coronary artery bypass graft(s), unspecified, with unspecified angina pectoris: Secondary | ICD-10-CM

## 2024-03-18 DIAGNOSIS — I10 Essential (primary) hypertension: Secondary | ICD-10-CM

## 2024-03-18 NOTE — Patient Instructions (Signed)
 Medication Instructions:  Your physician recommends that you continue on your current medications as directed. Please refer to the Current Medication list given to you today.  *If you need a refill on your cardiac medications before your next appointment, please call your pharmacy*  Lab Work: Your physician recommends that you return for lab work in:   Labs in 6 weeks: CMP, Lipids  If you have labs (blood work) drawn today and your tests are completely normal, you will receive your results only by: MyChart Message (if you have MyChart) OR A paper copy in the mail If you have any lab test that is abnormal or we need to change your treatment, we will call you to review the results.  Testing/Procedures: None  Follow-Up: At [facility], you and your health needs are our priority.  As part of our continuing mission to provide you with exceptional heart care, our providers are all part of one team.  This team includes your primary Cardiologist (physician) and Advanced Practice Providers or APPs (Physician Assistants and Nurse Practitioners) who all work together to provide you with the care you need, when you need it.  Your next appointment:   3 month(s)  Provider:   Lamar Fitch, MD    We recommend signing up for the patient portal called MyChart.  Sign up information is provided on this After Visit Summary.  MyChart is used to connect with patients for Virtual Visits (Telemedicine).  Patients are able to view lab/test results, encounter notes, upcoming appointments, etc.  Non-urgent messages can be sent to your provider as well.   To learn more about what you can do with MyChart, go to [email or web address].   Other Instructions None

## 2024-03-18 NOTE — Telephone Encounter (Signed)
 Pt is not interested at this time for cardiac rehab.   Closed referral.

## 2024-03-19 NOTE — Progress Notes (Signed)
 He has been newly diagnosed with trigeminal neuralgia so he may not feel up to participating, he also is uninsured.

## 2024-03-26 ENCOUNTER — Ambulatory Visit: Payer: Self-pay | Admitting: Cardiology

## 2024-06-05 ENCOUNTER — Other Ambulatory Visit: Payer: Self-pay | Admitting: Internal Medicine

## 2024-06-18 ENCOUNTER — Ambulatory Visit: Payer: Self-pay | Attending: Cardiology | Admitting: Cardiology

## 2024-06-24 DIAGNOSIS — I10 Essential (primary) hypertension: Secondary | ICD-10-CM

## 2024-06-24 DIAGNOSIS — R079 Chest pain, unspecified: Secondary | ICD-10-CM

## 2024-06-24 DIAGNOSIS — I2510 Atherosclerotic heart disease of native coronary artery without angina pectoris: Secondary | ICD-10-CM

## 2024-06-25 DIAGNOSIS — R079 Chest pain, unspecified: Secondary | ICD-10-CM
